# Patient Record
Sex: Female | Born: 1960 | Race: White | Hispanic: No | Smoking: Current some day smoker
Health system: Southern US, Community
[De-identification: ages and names within clinical notes are randomized; demographics above are authoritative.]

## PROBLEM LIST (undated history)

## (undated) DIAGNOSIS — J449 Chronic obstructive pulmonary disease, unspecified: Secondary | ICD-10-CM

---

## 1997-12-03 ENCOUNTER — Emergency Department (HOSPITAL_COMMUNITY): Admission: EM | Admit: 1997-12-03 | Discharge: 1997-12-03 | Payer: Self-pay | Admitting: Emergency Medicine

## 1997-12-04 ENCOUNTER — Ambulatory Visit (HOSPITAL_COMMUNITY): Admission: RE | Admit: 1997-12-04 | Discharge: 1997-12-04 | Payer: Self-pay | Admitting: Emergency Medicine

## 2007-07-22 ENCOUNTER — Ambulatory Visit: Payer: Self-pay | Admitting: Pulmonary Disease

## 2007-07-22 ENCOUNTER — Inpatient Hospital Stay (HOSPITAL_COMMUNITY): Admission: AD | Admit: 2007-07-22 | Discharge: 2007-07-29 | Payer: Self-pay | Admitting: Pulmonary Disease

## 2007-07-25 ENCOUNTER — Encounter (INDEPENDENT_AMBULATORY_CARE_PROVIDER_SITE_OTHER): Payer: Self-pay | Admitting: Pulmonary Disease

## 2008-02-12 ENCOUNTER — Encounter: Payer: Self-pay | Admitting: Gastroenterology

## 2008-02-13 ENCOUNTER — Encounter: Payer: Self-pay | Admitting: Gastroenterology

## 2008-03-21 ENCOUNTER — Encounter: Payer: Self-pay | Admitting: Gastroenterology

## 2008-04-09 ENCOUNTER — Ambulatory Visit: Payer: Self-pay | Admitting: *Deleted

## 2008-04-10 ENCOUNTER — Inpatient Hospital Stay (HOSPITAL_COMMUNITY): Admission: AD | Admit: 2008-04-10 | Discharge: 2008-04-11 | Payer: Self-pay | Admitting: *Deleted

## 2009-02-27 ENCOUNTER — Ambulatory Visit: Payer: Self-pay | Admitting: Emergency Medicine

## 2009-02-27 DIAGNOSIS — J8409 Other alveolar and parieto-alveolar conditions: Secondary | ICD-10-CM | POA: Insufficient documentation

## 2009-02-27 DIAGNOSIS — J449 Chronic obstructive pulmonary disease, unspecified: Secondary | ICD-10-CM

## 2009-02-27 DIAGNOSIS — F17210 Nicotine dependence, cigarettes, uncomplicated: Secondary | ICD-10-CM

## 2009-02-27 DIAGNOSIS — R0600 Dyspnea, unspecified: Secondary | ICD-10-CM | POA: Insufficient documentation

## 2009-02-27 DIAGNOSIS — R0602 Shortness of breath: Secondary | ICD-10-CM

## 2009-03-14 ENCOUNTER — Ambulatory Visit: Payer: Self-pay | Admitting: Emergency Medicine

## 2009-03-15 LAB — CONVERTED CEMR LAB
AST: 30 units/L (ref 0–37)
Alkaline Phosphatase: 78 units/L (ref 39–117)
Bilirubin, Direct: 0.2 mg/dL (ref 0.0–0.3)
Total Bilirubin: 0.9 mg/dL (ref 0.3–1.2)

## 2009-04-19 ENCOUNTER — Telehealth: Payer: Self-pay | Admitting: Emergency Medicine

## 2009-05-31 ENCOUNTER — Ambulatory Visit: Payer: Self-pay | Admitting: Emergency Medicine

## 2009-06-25 ENCOUNTER — Encounter: Payer: Self-pay | Admitting: Gastroenterology

## 2009-06-27 ENCOUNTER — Telehealth (INDEPENDENT_AMBULATORY_CARE_PROVIDER_SITE_OTHER): Payer: Self-pay | Admitting: *Deleted

## 2009-07-19 ENCOUNTER — Ambulatory Visit: Payer: Self-pay | Admitting: Emergency Medicine

## 2009-07-29 ENCOUNTER — Telehealth: Payer: Self-pay | Admitting: Internal Medicine

## 2009-09-06 ENCOUNTER — Encounter (INDEPENDENT_AMBULATORY_CARE_PROVIDER_SITE_OTHER): Payer: Self-pay | Admitting: *Deleted

## 2009-09-06 ENCOUNTER — Ambulatory Visit: Payer: Self-pay | Admitting: Emergency Medicine

## 2009-09-06 ENCOUNTER — Encounter: Payer: Self-pay | Admitting: Emergency Medicine

## 2009-09-06 DIAGNOSIS — R141 Gas pain: Secondary | ICD-10-CM

## 2009-09-06 DIAGNOSIS — R142 Eructation: Secondary | ICD-10-CM

## 2009-09-06 DIAGNOSIS — R143 Flatulence: Secondary | ICD-10-CM

## 2009-09-11 ENCOUNTER — Telehealth (INDEPENDENT_AMBULATORY_CARE_PROVIDER_SITE_OTHER): Payer: Self-pay | Admitting: *Deleted

## 2009-09-26 ENCOUNTER — Ambulatory Visit: Payer: Self-pay | Admitting: Gastroenterology

## 2009-09-26 DIAGNOSIS — K902 Blind loop syndrome, not elsewhere classified: Secondary | ICD-10-CM

## 2009-09-26 DIAGNOSIS — F411 Generalized anxiety disorder: Secondary | ICD-10-CM

## 2009-09-26 DIAGNOSIS — K589 Irritable bowel syndrome without diarrhea: Secondary | ICD-10-CM | POA: Insufficient documentation

## 2009-09-26 DIAGNOSIS — K5900 Constipation, unspecified: Secondary | ICD-10-CM | POA: Insufficient documentation

## 2009-09-27 LAB — CONVERTED CEMR LAB
Folate: 4.5 ng/mL
IgA: 276 mg/dL (ref 68–378)
Iron: 38 ug/dL — ABNORMAL LOW (ref 42–145)
Magnesium: 2.1 mg/dL (ref 1.5–2.5)
T4, Total: 9.2 ug/dL (ref 5.0–12.5)
TSH: 0.73 microintl units/mL (ref 0.35–5.50)
Vitamin B-12: 165 pg/mL — ABNORMAL LOW (ref 211–911)

## 2009-10-02 LAB — CONVERTED CEMR LAB: Tissue Transglutaminase Ab, IgA: 0.5 units (ref <7)

## 2009-10-07 ENCOUNTER — Ambulatory Visit: Payer: Self-pay | Admitting: Emergency Medicine

## 2009-10-07 DIAGNOSIS — D239 Other benign neoplasm of skin, unspecified: Secondary | ICD-10-CM | POA: Insufficient documentation

## 2009-10-18 ENCOUNTER — Telehealth: Payer: Self-pay | Admitting: Emergency Medicine

## 2009-10-29 ENCOUNTER — Telehealth (INDEPENDENT_AMBULATORY_CARE_PROVIDER_SITE_OTHER): Payer: Self-pay | Admitting: *Deleted

## 2009-11-08 ENCOUNTER — Ambulatory Visit: Payer: Self-pay | Admitting: Emergency Medicine

## 2009-11-11 ENCOUNTER — Ambulatory Visit: Payer: Self-pay | Admitting: Gastroenterology

## 2009-11-11 DIAGNOSIS — K219 Gastro-esophageal reflux disease without esophagitis: Secondary | ICD-10-CM

## 2009-11-20 ENCOUNTER — Telehealth: Payer: Self-pay | Admitting: Emergency Medicine

## 2009-11-20 ENCOUNTER — Telehealth: Payer: Self-pay | Admitting: Gastroenterology

## 2009-11-29 ENCOUNTER — Telehealth: Payer: Self-pay | Admitting: Pulmonary Disease

## 2009-12-06 ENCOUNTER — Ambulatory Visit: Payer: Self-pay | Admitting: Emergency Medicine

## 2009-12-09 ENCOUNTER — Telehealth (INDEPENDENT_AMBULATORY_CARE_PROVIDER_SITE_OTHER): Payer: Self-pay | Admitting: *Deleted

## 2009-12-11 ENCOUNTER — Ambulatory Visit: Payer: Self-pay | Admitting: Cardiology

## 2009-12-12 ENCOUNTER — Telehealth (INDEPENDENT_AMBULATORY_CARE_PROVIDER_SITE_OTHER): Payer: Self-pay | Admitting: *Deleted

## 2009-12-14 ENCOUNTER — Telehealth (INDEPENDENT_AMBULATORY_CARE_PROVIDER_SITE_OTHER): Payer: Self-pay | Admitting: *Deleted

## 2009-12-14 ENCOUNTER — Telehealth: Payer: Self-pay | Admitting: Emergency Medicine

## 2009-12-19 ENCOUNTER — Telehealth: Payer: Self-pay | Admitting: Gastroenterology

## 2009-12-19 ENCOUNTER — Telehealth (INDEPENDENT_AMBULATORY_CARE_PROVIDER_SITE_OTHER): Payer: Self-pay | Admitting: *Deleted

## 2010-09-23 NOTE — Progress Notes (Signed)
Summary: CXR results  Phone Note Call from Patient Call back at Home Phone [phone]   Caller: Patient Call For: byrum Reason for Call: Talk to Nurse, Lab or Test Results Summary of Call: pt would like results of cxr Initial call taken by: Eugene Gavia,  September 11, 2009 11:14 AM  Follow-up for Phone Call        cxr results from 09/06/09 are signed in EMR but are not interpreted.  Will forward to RB-please advise.  Thanks! Gweneth Dimitri RN  September 11, 2009 11:17 AM  Please notify pt that her CXR is stable to slightly improved compared with her prior film. She should stay on her current meds. Follow up as planned. Leslye Peer MD  September 11, 2009 4:36 PM   Follow-up by: Leslye Peer MD,  September 11, 2009 4:36 PM  Additional Follow-up for Phone Call Additional follow up Details #1::        called, spoke with pt.  Pt informed of cxr results and recs as stated above per RB.  She verbalized understanding of instructions.   Additional Follow-up by: Gweneth Dimitri RN,  September 11, 2009 4:42 PM

## 2010-09-23 NOTE — Assessment & Plan Note (Signed)
Summary: COP, COPD, tobacco   Visit Type:  Follow-up Copy to:  Levy Pupa, MD Primary Provider/Referring Provider:  Dr. Donnel Saxon  CC:  Pt here for 1 month follow up. Pt states was diagnosed with PNA 11/30/2009 when seen @ [facility] in [place] and currently being treated with Avelox. Pt states is down to Prednisone 4mg  daily. Pt states breathing has gotten worse "at times" since last OV.  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 Hospitalized for resp failure at [facility] 11/08 requiring BiPAP. Has been treated with chronic prednisone. Frequent decompensations that seem to come after URI's or allergies, characterized by wheezing, more dyspnea, mid chest pain, cough productive of yellow phlegm.   ROV 11/08/09 -- Returns for follow up. Tells me that she was admitted to [facility] end of February, apparently dx with CAP, ? bacteremia. Was exposed to people w/ URI prior to that illness. Was treated with pred burst, then back down to 5mg  once daily (we had decreased to 5mg  once daily at our last visit). Dr Stefanie Libel office has given her a script to decrease the Pred by 1mg  qweek until down to zero. Smoking about 4 cigarettes a week!  ROV 12/06/09 -- returns for f/u of her COP, COPD. Was seen at urgent care a week ago with low grade temp, URI symptoms. Evolved cough, wheeze, green phlegm. Had significant wheeze, SOB. Was treated with avelox + solumedrol. Then stayed on her usual prednisone to 4mg  once daily. Using Spiriva + Symbicort + xopenex as needed. Smoking less than a pack a day. Still using the nicotine cartridge.  CXR from Urgent Care reviewed today - shows chronic interstitial changes, RUL calcified granuloma at the fisure that looks stable from priors.     Current Medications (verified): 1)  Spiriva Handihaler 18 Mcg Caps (Tiotropium Bromide Monohydrate) .... I Puff Once Daily 2)  Symbicort 160-4.5 Mcg/act Aero  (Budesonide-Formoterol Fumarate) .... 2 Puffs Two Times A Day 3)  Furosemide 20 Mg Tabs (Furosemide) .... 60mg  Daily As Needed 4)  Pristiq 50 Mg Xr24h-Tab (Desvenlafaxine Succinate) .... Once Daily 5)  Alprazolam 2 Mg Tabs (Alprazolam) .... Three Times A Day As Needed 6)  Nasonex 50 Mcg/act Susp (Mometasone Furoate) .... 2 Puffs in Each Nostril Once Daily 7)  Xopenex 1.25 Mg/89ml Nebu (Levalbuterol Hcl) .... As Needed 8)  Tramadol Hcl 50 Mg Tabs (Tramadol Hcl) .... One Tablet By Mouth Every 6 Hours As Needed 9)  Prednisone 5 Mg Tabs (Prednisone) .Marland Kitchen.. 1 By Mouth Daily X1 Week; Tapering Down 1mg  Every Week Until Finished 10)  Nexium 40 Mg Cpdr (Esomeprazole Magnesium) .Marland Kitchen.. 1 By Mouth Q Am 11)  Creon 24000 Unit Cpep (Pancrelipase (Lip-Prot-Amyl)) .Marland Kitchen.. 1 By Mouth Three Times A Day With Meals 12)  Avelox 400 Mg Tabs (Moxifloxacin Hcl) .... Take 1 Tablet By Mouth Once A Day 13)  Vitamin C 500 Mg Tabs (Ascorbic Acid) .... Take 1 Tablet By Mouth Once A Day 14)  Vitamin D3 2000 Unit Caps (Cholecalciferol) .... Take 1 Tablet By Mouth Once A Day 15)  Cheratussin Ac 100-10 Mg/82ml Syrp (Guaifenesin-Codeine) .... As Needed 16)  Promethazine Hcl 50 Mg Tabs (Promethazine Hcl) .... As Needed  Allergies (verified): 1)  Sudafed 2)  * Dimetapp 3)  Sulfamethoxazole 4)  * Itraconazole 5)  * Hydrocodone/apap 6)  Codeine 7)  * Antihistamines  Vital Signs:  Patient profile:   50 year old female Height:  64 inches Weight:      129.13 pounds O2 Sat:      100 % on 2 L/min Temp:     97.7 degrees F oral Pulse rate:   87 / minute BP sitting:   110 / 76  (right arm) Cuff size:   regular  Vitals Entered By: Zackery Barefoot CMA (December 06, 2009 2:25 PM)  O2 Flow:  2 L/min  Serial Vital Signs/Assessments:  Comments: Ambulatory Pulse Oximetry  Resting; HR__85___    02 Sat__91%RA___  Lap1 (185 feet)   HR__102___   02 Sat__88%RA___ Lap2 (185 feet)   HR_____   02 Sat_____    Lap3 (185 feet)   HR_____    02 Sat_____  ___Test Completed without Difficulty __x_Test Stopped due to: pt c/o being too dizzy to continue.  Zackery Barefoot CMA  December 06, 2009 3:41 PM    By: Zackery Barefoot CMA   CC: Pt here for 1 month follow up. Pt states was diagnosed with PNA 11/30/2009 when seen @ [facility] in [place] and currently being treated with Avelox. Pt states is down to Prednisone 4mg  daily. Pt states breathing has gotten worse "at times" since last OV Comments Medications reviewed with patient Verified contact number and pharmacy with patient Zackery Barefoot CMA  December 06, 2009 2:26 PM    Physical Exam  General:  Illl-appearing woman Head:  normocephalic and atraumatic, cushingoid features.  Eyes:  conjunctiva and sclera clear, no icterus Mouth:  no deformity or lesions Lungs:  coarse bilaterally with inspiratory rhonchi and squeeks, no exp wheeze.  Heart:  tachycardic, regular Abdomen:  not examined Msk:  no deformity or scoliosis noted with normal posture Extremities:  no edema Neurologic:  non-focal Skin:  intact without lesions or rashes Psych:  alert and cooperative; normal mood and affect; normal attention span and concentration   X-ray  Procedure date:  11/30/2009  Findings:      CXR from Urgent Care reviewed today - shows chronic interstitial changes, RUL calcified granuloma at the fisure that looks stable from priors.   Pulmonary Function Test Date: 12/06/2009 3:26 PM Gender: Female  Pre-Spirometry FVC    Value: 1.19 L/min   % Pred: 33.70 % FEV1    Value: 1.00 L     Pred: 2.81 L     % Pred: 35.50 % FEV1/FVC  Value: 83.77 %     % Pred: 104.20 %  Impression & Recommendations:  Problem # 1:  COPD (ICD-496) With recent URI and exacerbation. 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 One tablet by mouth every 6 hours as needed 2)  Avelox 400 Mg Tabs (Moxifloxacin hcl) .... Take 1 tablet by mouth once a day 3)  Vitamin C 500 Mg Tabs (Ascorbic acid) .... Take 1 tablet by mouth once a day 4)  Vitamin D3 2000 Unit Caps (Cholecalciferol) .... Take 1 tablet by mouth once a day 5)  Cheratussin Ac 100-10 Mg/71ml Syrp (Guaifenesin-codeine) .... As needed 6)  Promethazine Hcl  50 Mg Tabs (Promethazine hcl) .... As needed  Other Orders: Est. Patient Level IV (16109) Spirometry (Pre & Post) (94060) Pulse Oximetry, Ambulatory (60454) Radiology Referral (Radiology)  Patient Instructions: 1)  Continue your Spiriva and Symbicort 2)  Use Xopenex as needed  3)  Walking oximetry today showed 4)  Spirometry today shows your lung function is 35% 5)  We will schedule a high resolution CT scan of the chest to compare with your prior study in 06/2007.  6)  You must quit smoking for [place] to make any further progress with your lung disease.  7)  Continue your prednisone 4mg  once daily through April, then decrease by 1mg  each month: start taking 3mg  once daily in May.  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

## 2010-09-23 NOTE — Assessment & Plan Note (Signed)
Summary: COPD, ? COP, tobacco   Visit Type:  Follow-up Copy to:  Dr. Ardelle Park Primary Provider/Referring Provider:  Dr. Donnel Saxon  CC:  2 month followup COPD.  Pt c/o wheezing and CP for about 3 wks.  She states that pain is in the center of chest and worsens u[on inspiration.  She still c/o SOB with exertion "maybe some worse".  She had also had some prod cough with yellow sputum and relates this to "sinus drainage".  .  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 Hospitalized for resp failure at [facility] 11/08 requiring BiPAP. Has been on O2 since 2004. Her subsequent course has been "up and down". She does well but then has decompensations that seem to come after URI's or allergies, characterized by wheezing, more dyspnea, mid chest pain, cough productive of yellow phlegm. No hemoptysis. Last episode like this was in May 2010, admitted to [facility]. Last XRays done at [place]. Smoking about a pack a week. Episodes are getting progressively worse over several yrs. She is currently limited in her activity due to breathing and back pain.  ROV 03/14/09 -- returns for planned follow up today. Breathing is slightly improved. No exacerbations since last visit. Remains on 15mg  once daily Prednisone.   ROV 05/31/09 -- returns for follow up, torsemide added to lasix in order to better treat possible ascites. Breathing has been fairly stable. Currently on Pred 15mg  once daily. No hospitalizations since last visit. Has been wearing her O2 with most exertion. On Symbicort and Spiriva.   07/19/09 -- Hx of BOOP, ? 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 Last time I decreased her Prednisone to 10mg  qd. Returns for follow up. Had CT scan of the abdomen 3 weeks ago, was told that it was normal with exception of ovarian cysts. Has seen Dr Ardelle Park, currently being treated for COPD exacerbation +/- PNA with avelox x 2 weeks and increased her  prednisone to 15mg  qd. She presented with URI symptoms to him. Her cough and sputum production have improved with the abx. Smoking is significantly decreased, hasn't quit yet.   ROV 09/06/09 -- Caught a cold a few weeks ago, went to [place] ED and got levaquin, no change in Pred, currently on 10mg . Still with some cough, rarely productive. Severe exertional dyspnea. She couldn't tell any improvement after the abx. Smoking less because she is too SOB, pack lasts a week.     Current Medications (verified): 1)  Spiriva Handihaler 18 Mcg Caps (Tiotropium Bromide Monohydrate) .... I Puff Once Daily 2)  Symbicort 160-4.5 Mcg/act Aero (Budesonide-Formoterol Fumarate) .... 2 Puffs Two Times A Day 3)  Prednisone 5 Mg Tabs (Prednisone) .... 3 Tabs Once Daily 4)  Furosemide 20 Mg Tabs (Furosemide) .... 60mg  Daily 5)  Pristiq 50 Mg Xr24h-Tab (Desvenlafaxine Succinate) .... Once Daily 6)  Dicyclomine Hcl 20 Mg Tabs (Dicyclomine Hcl) .... Four Times Daily 30 Minutes Before Each Meal 7)  Alprazolam 2 Mg Tabs (Alprazolam) .... Three Times A Day 8)  Nasonex 50 Mcg/act Susp (Mometasone Furoate) .... 2 Puffs in Each Nostril Once Daily 9)  Xopenex 1.25 Mg/74ml Nebu (Levalbuterol Hcl) .... As Needed 10)  Ibuprofen 600 Mg Tabs (Ibuprofen) .... As Needed 11)  Lyrica 100 Mg Caps (Pregabalin) .... Three Times A Day As Needed 12)  Align  Caps (Probiotic Product) .Marland Kitchen.. 1 By Mouth Daily  Allergies (verified): 1)  Sudafed 2)  * Dimetapp  3)  Sulfamethoxazole 4)  * Itraconazole 5)  * Hydrocodone/apap 6)  Codeine 7)  * Antihistamines  Vital Signs:  Patient profile:   50 year old female Weight:      139 pounds O2 Sat:      99 % on 2 L/min Temp:     97.2 degrees F oral Pulse rate:   80 / minute BP sitting:   122 / 80  (left arm)  Vitals Entered By: Vernie Murders (September 06, 2009 11:50 AM)  O2 Flow:  2 L/min  Physical Exam  General:  Illl-appearing woman Head:  normocephalic and atraumatic, cushingoid  features Eyes:  conjunctiva and sclera clear, no icterus Mouth:  no deformity or lesions Lungs:  coarse bilaterally with inspiratory rhonchi and squeeks, no exp wheeze.  Heart:  tachycardic, regular Abdomen:  protuberant but much softer than initial visit, mild tenderness to deep palp Msk:  no deformity or scoliosis noted with normal posture Extremities:  no edema Neurologic:  non-focal Skin:  intact without lesions or rashes Psych:  tearful, answers questions appropriately.    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 Patient Level IV (99214) T-2 View CXR (71020TC)  Patient Instructions: 1)  Continue your Spiriva and Symbicort as scheduled 2)  Xopenex as needed.  3)  Continue your oxygen as you are using it.  4)  Walking oximetry today on 2L/min.  5)  CXR today.  6)  Continue your efforts to stop smoking.  7)  Follow up with Dr Delton Coombes in 1 month.   Appended Document: COPD, ? COP, tobacco Ambulatory Pulse Oximetry  Resting; HR_82____    02 Sat_98% 2L cont____  Lap1 (185 feet)   HR__101___   02 Sat__98% 2Lcont___ Lap2 (185 feet)   HR_____   02 Sat_____    Lap3 (185 feet)   HR_____   02 Sat_____  ___Test Completed without Difficulty __x_Test Stopped due to:         pt needed to rest.

## 2010-09-23 NOTE — Miscellaneous (Signed)
Summary: Orders Update  Clinical Lists Changes  Problems: Added new problem of ABDOMINAL DISTENSION (ICD-787.3) Orders: Added new Referral order of Gastroenterology Referral (GI) - Signed

## 2010-09-23 NOTE — Progress Notes (Signed)
Summary: speak to doctor  Phone Note From Other Clinic Call back at [phone]   Caller: Dr. [facility] Call For: Krystal Cantu Summary of Call: Pt admitted to [facility], Dr. Ernest Mallick wants to speak with Dr. Delton Coombes in reference to this.//[phone] Initial call taken by: Darletta Moll,  October 18, 2009 9:32 AM  Follow-up for Phone Call        I spoke with Dr. Ernest Mallick and he just wanted to make sure RB was aware pt was being admitted to [facility] with the diagnosis of pneumonia. He said he will make sure her records get sent to [facility] for review. Follow-up by: Michel Bickers CMA,  October 18, 2009 11:35 AM

## 2010-09-23 NOTE — Progress Notes (Signed)
Summary: ct results  Phone Note Call from Patient Call back at Home Phone [phone]   Caller: Patient Call For: byrum Reason for Call: Talk to Nurse, Lab or Test Results Summary of Call: pt wants results of CT scan.  No one has called or set her up for return appt. Initial call taken by: Eugene Gavia,  December 19, 2009 10:45 AM  Follow-up for Phone Call        Dr. Delton Coombes, please advise CT Chest results from 4/20 thanks! Follow-up by: Vernie Murders,  December 19, 2009 10:54 AM  Additional Follow-up for Phone Call Additional follow up Details #1::        Please inform pt that her CT scan is stable. It still shows emphysema, evidence for chronic airways inflammation, some stable nodules. thanks. Leslye Peer MD  Dec 23, 2009 2:58 PM     Additional Follow-up for Phone Call Additional follow up Details #2::    Spoke with pt and advised of the above results.  Pt verbalized understanding. Vernie Murders  Dec 23, 2009 3:04 PM

## 2010-09-23 NOTE — Procedures (Signed)
Summary: [facility]   Imported By: Sherian Rein 10/03/2009 11:02:42  _____________________________________________________________________  External Attachment:    Type:   Image     Comment:   External Document

## 2010-09-23 NOTE — Progress Notes (Signed)
Summary: Triage  Phone Note Call from Patient Call back at Home Phone [phone]   Caller: Patient Call For: Dr. Jarold Motto Reason for Call: Talk to Nurse Summary of Call: pt. 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 Initial call taken by: Karna Christmas,  December 19, 2009 11:07 AM  Follow-up for Phone Call        Pt states she cont's to have problems with bowels.  Alternates between constipation and diarrhea.  Abd bloating and fullness.  Con't to loose wt.  Down to 121 lbs.   Pt states Dr. 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  Pt asks if this could be the cause of her symptoms. .  Down to 121 lbs.   If so asking if there is a treatment she can take.  States she if miserable.  Follow-up by: Ashok Cordia RN,  December 19, 2009 3:58 PM  Additional Follow-up for Phone Call Additional follow up Details #1::        end stage lung disease...does not have bacterial overgrowtrh...refer to [facility] if she wants...i have no other ideas here,,,several negative gi workups... Additional Follow-up by: Mardella Layman MD FACG,  December 20, 2009 8:23 AM    Additional Follow-up for Phone Call Additional follow up Details #2::    Pt notified. Follow-up by: Ashok Cordia RN,  December 20, 2009 12:36 PM

## 2010-09-23 NOTE — Progress Notes (Signed)
Summary: pt requesting ct chest results  Phone Note Call from Patient Call back at Home Phone [phone]   Caller: Patient Call For: byrum Summary of Call: pt wants results of CT.  Initial call taken by: Tivis Ringer, CNA,  December 12, 2009 4:18 PM  Follow-up for Phone Call        Spoke with pt and advised results are unsigned and RB not back in the office again until 4/26.  Pt okay to wait until then for results.  Please advise thanks! Follow-up by: Vernie Murders,  December 12, 2009 4:31 PM  Additional Follow-up for Phone Call Additional follow up Details #1::        discussed with her Leslye Peer MD  December 18, 2009 3:58 PM  Additional Follow-up by: Leslye Peer MD,  December 18, 2009 3:58 PM

## 2010-09-23 NOTE — Assessment & Plan Note (Signed)
Summary: hospital follow up/lk   History of Present Illness Visit Type: Follow-up Visit Primary GI MD: Sheryn Bison MD FACP FAGA Primary Provider: Dr. Donnel Saxon Requesting Provider: Levy Pupa, MD Chief Complaint: Abdominal pain after meals, some distension History of Present Illness:   Extremely complex patient with bronchiectasis and recurrent pneumonia folic placed about pulmonary. She is on frequent steroids as recently had pneumonia and sepsis. She continued to complain of abdominal distention, gas and bloating unresponsive to most medications but she has had some improvement with probiotic therapy. Lab data showed a low serum trypsinogen level suggestive of chronic pancreatitis, and she also had a low serum B12 level and has had pleural placement. She continues to complain of gas, bloating, and alternating diarrhea and constipation. She is on chronic oxygen administration.    GI Review of Systems    Reports abdominal pain, acid reflux, bloating, loss of appetite, nausea, and  weight loss.     Location of  Abdominal pain: lower abdomen.    Denies belching, chest pain, dysphagia with liquids, dysphagia with solids, heartburn, vomiting, vomiting blood, and  weight gain.      Reports change in bowel habits, constipation, and  diarrhea.     Denies anal fissure, black tarry stools, diverticulosis, fecal incontinence, heme positive stool, hemorrhoids, irritable bowel syndrome, jaundice, light color stool, liver problems, rectal bleeding, and  rectal pain.    Current Medications (verified): 1)  Spiriva Handihaler 18 Mcg Caps (Tiotropium Bromide Monohydrate) .... I Puff Once Daily 2)  Symbicort 160-4.5 Mcg/act Aero (Budesonide-Formoterol Fumarate) .... 2 Puffs Two Times A Day 3)  Furosemide 20 Mg Tabs (Furosemide) .... 60mg  Daily As Needed 4)  Pristiq 50 Mg Xr24h-Tab (Desvenlafaxine Succinate) .... Once Daily 5)  Alprazolam 2 Mg Tabs (Alprazolam) .... Three Times A Day As Needed 6)   Nasonex 50 Mcg/act Susp (Mometasone Furoate) .... 2 Puffs in Each Nostril Once Daily 7)  Xopenex 1.25 Mg/13ml Nebu (Levalbuterol Hcl) .... As Needed 8)  Tramadol Hcl 50 Mg Tabs (Tramadol Hcl) .... One Tablet By Mouth Every 6 Hours 9)  Phillips Colon Health  Caps (Probiotic Product) .... As Needed 10)  Miralax   Powd (Polyethylene Glycol 3350) .... Use Each Night 11)  Prednisone 5 Mg Tabs (Prednisone) .Marland Kitchen.. 1 By Mouth Daily X1 Week; Tapering Down 1mg  Every Week Until Finished  Allergies (verified): 1)  Sudafed 2)  * Dimetapp 3)  Sulfamethoxazole 4)  * Itraconazole 5)  * Hydrocodone/apap 6)  Codeine 7)  * Antihistamines  Past History:  Past medical, surgical, family and social histories (including risk factors) reviewed for relevance to current acute and chronic problems.  Past Medical History: Reviewed history from 09/26/2009 and no changes required. 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 Lung biopsy hysterectomy BMT Tonsillectomy Partial Hysterectomy Feet Surgery Adenoids removed  Family History: Reviewed history from 09/26/2009 and no changes required. Cancer-mother(cerivcal),  Lung CA-Father HTN Heart problem-father arthritis-father Breast CA- maternal aunt emphsema-father allergies-mother Family History of Colon Polyps:Grandmother  Social History: Reviewed history from 09/26/2009 and no changes required. Patient is a current smoker.  2-3ppd x46yr.  currently smoking about 1 pack a week per pt. Divorced Disabled Alcohol Use - no Illicit Drug Use - no  Review of Systems  The patient complains of  arthritis/joint pain, back pain, cough, depression-new, fatigue, headaches-new, heart rhythm changes, muscle pains/cramps, shortness of breath, sleeping problems, swelling of feet/legs, and thirst - excessive.  The patient denies allergy/sinus, anemia, anxiety-new, blood in urine, breast changes/lumps, change in vision, confusion, coughing up blood, fainting, fever, hearing problems, heart murmur, itching, menstrual pain, night sweats, nosebleeds, pregnancy symptoms, skin rash, sore throat, swollen lymph glands, thirst - excessive , urination - excessive , urination changes/pain, urine leakage, vision changes, and voice change.    Vital Signs:  Patient profile:   50 year old female Height:      64 inches Weight:      129.50 pounds BMI:     22.31 Pulse rate:   88 / minute Pulse rhythm:   regular BP sitting:   102 / 58  (left arm) Cuff size:   regular  Vitals Entered By: June McMurray CMA Duncan Dull) (November 11, 2009 3:32 PM)  Physical Exam  General:  Well developed, well nourished, no acute distress. Head:  Normocephalic and atraumatic. Eyes:  PERRLA, no icterus.exam deferred to patient's ophthalmologist.   Abdomen:  Distended abdomen but without organomegaly, masses, localized tenderness. Psych:  Alert and cooperative. Normal mood and affect.   Impression & Recommendations:  Problem # 1:  IBS (ICD-564.1) Assessment Improved Continue Probiotic therapy empirically.There is a suggestion of possible chronic pancreatitis and I will place her on Creon tablets one p.o. t.i.d. with meals. She is to continue monthly B12 injection therapy.  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 Delton Coombes.  Problem # 5:  GERD (ICD-530.81) Assessment: Unchanged Nexium 40 mg a  day prescribed along with standard anti-reflux maneuvers.  Patient Instructions: 1)  Begin Nexium 40 mg once a day. 2)   Begin Creon one three times a day with meals. 3)  Call us in a couple of weeks and report your symptoms. 4)  The medication list was reviewed and reconciled.  All changed / newly prescribed medications were explained.  A complete medication list was provided to the patient / caregiver. 5)  Please continue current medications.  6)  Copy sent to : Dr. Levy Pupa and pulmonary and Dr. Donnel Saxon 7)  Please call our GI Office back in 2 weeks with a follow-up of symptoms. Prescriptions: CREON 24000 UNIT CPEP (PANCRELIPASE (LIP-PROT-AMYL)) 1 by mouth three times a day with meals  #90 x 6   Entered by:   Ashok Cordia RN   Authorized by:   Mardella Layman MD [facility]   Signed by:   Ashok Cordia RN on 11/11/2009   Method used:   Electronically to        [facility] (retail)       [address]       [place], [place]  [id number]       Ph: [phone]       Fax: [phone]   RxID:   [id number] NEXIUM 40 MG CPDR (ESOMEPRAZOLE MAGNESIUM) 1 by mouth q am  #30 x 6   Entered by:   Ashok Cordia RN   Authorized by:   Mardella Layman MD [facility]   Signed by:   Ashok Cordia RN on 11/11/2009   Method used:   Electronically to  [facility] (retail)       [address]       [place], [place]  [id number]       Ph: [phone]       Fax: [phone]   RxID:   [id number]

## 2010-09-23 NOTE — Progress Notes (Signed)
  Phone Note Call from Patient   Caller: Patient Summary of Call: She has been feeling sick for the past few days.  She has been running a low grade temp of 100F.  She has been getting congested and more short of breath.  She has tried using her inhalers at home, but this has not helped much.  Her daughter was recently sick with a cold, and she thinks she may have picked something up.  She was seen by her primary physician in [place], and told that she had coarse breath sounds.  I advised Ms. Wilshire to go to the emergency room to have further evaluation with possible need for chest xray as there is concern that she could be developing pneumonia.  Advised her to go to [facility] since this is closer to her.  Advised that if needed arrangements could be made to have her transported to [facility].  Initial call taken by: Coralyn Helling MD,  November 29, 2009 8:32 PM

## 2010-09-23 NOTE — Progress Notes (Signed)
  Phone Note Call from Patient   Summary of Call: Received page from answering service to contact patient at [phone].  Returned call to this number, but was connected to voicemail.  Left message for patient to contact answering service again if pulmonary assistance was still needed at this time. Initial call taken by: Coralyn Helling MD,  November 29, 2009 8:19 PM

## 2010-09-23 NOTE — Progress Notes (Signed)
Summary: talk to nurse  Phone Note Call from Patient   Caller: Patient Call For: byrum Summary of Call: pt want to know if she can use the electronic cigarrette Initial call taken by: Rickard Patience,  October 29, 2009 2:38 PM  Follow-up for Phone Call        [facility]. Carron Curie CMA  October 29, 2009 2:43 PM  Pt wants to know if RB thinks it is ok for her to use an electronic cigarette? States this is 99% water, and what she would be inhaling is water vapor that has some nicotine, but according to the patient it does not have any of the other incredients that a cig does. She does not need an rx for this, she just wants to know if RB thinks it is ok to try. Please advise. Carron Curie CMA  October 29, 2009 3:00 PM  I think this would be fine IF she does it in conjunction with an overall effort to quit: set a quit date, get rid of cigarettes, tell her friends/family that she is quitting, etc. Leslye Peer MD  November 04, 2009 2:45 PM    Follow-up by: Leslye Peer MD,  November 04, 2009 2:45 PM  Additional Follow-up for Phone Call Additional follow up Details #1::        Spoke with pt and made aware of the above recs per RB.  Pt states that she has plenty of support from family and friends and already has gotten rid of her ashtrays, Management consultant, cigs! She already has quit date also! Advised to call us if needed and good luck! Additional Follow-up by: Vernie Murders,  November 04, 2009 3:01 PM

## 2010-09-23 NOTE — Letter (Signed)
Summary: New Patient letter  [facility]  [address] [place], [place] [address]   Phone: [phone]  Fax: [phone]       09/06/2009 MRN: [id number]  Krystal Cantu [address] [place], [place]  [address]  Dear Krystal Cantu,  Welcome to the Gastroenterology Division at [facility].    You are scheduled to see Dr.  Jarold Motto on 09/26/2009 at 8:30AM on the 3rd floor at [facility], [address]  We ask that you try to arrive at our office 15 minutes prior to your appointment time to allow for check-in.  We would like you to complete the enclosed self-administered evaluation form prior to your visit and bring it with you on the day of your appointment.  We will review it with you.  Also, please bring a complete list of all your medications or, if you prefer, bring the medication bottles and we will list them.  Please bring your insurance card so that we may make a copy of it.  If your insurance requires a referral to see a specialist, please bring your referral form from your primary care physician.  Co-payments are due at the time of your visit and may be paid by cash, check or credit card.     Your office visit will consist of a consult with your physician (includes a physical exam), any laboratory testing he/she may order, scheduling of any necessary diagnostic testing (e.g. x-ray, ultrasound, CT-scan), and scheduling of a procedure (e.g. Endoscopy, Colonoscopy) if required.  Please allow enough time on your schedule to allow for any/all of these possibilities.    If you cannot keep your appointment, please call [phone] to cancel or reschedule prior to your appointment date.  This allows [place] the opportunity to schedule an appointment for another patient in need of care.  If you do not cancel or reschedule by 5 p.m. the business day prior to your appointment date, you will be charged a $50.00 late cancellation/no-show fee.    Thank you for  choosing [facility] for your medical needs.  We appreciate the opportunity to care for you.  Please visit [place] at our website  to learn more about our practice.                     Sincerely,                                                             The Gastroenterology Division

## 2010-09-23 NOTE — Procedures (Signed)
Summary: EGD/[facility]  EGD/[facility]   Imported By: Sherian Rein 10/03/2009 11:03:43  _____________________________________________________________________  External Attachment:    Type:   Image     Comment:   External Document

## 2010-09-23 NOTE — Progress Notes (Signed)
Summary: Triage  Phone Note Call from Patient Call back at Home Phone [phone]   Caller: Patient Call For: Dr. Jarold Motto Reason for Call: Talk to Nurse Summary of Call: pt says she is losing weight and is very weak. Has a good appetite but continues to lose weight.  Wants to know if the Creon medication could be causing this? Initial call taken by: Karna Christmas,  November 20, 2009 2:49 PM  Follow-up for Phone Call        Talked with pt.  She is concerned about continued weight loss and lack of energy.  Pt has lost from 149-129 in 7 months.   Pt states her abd swelling has gone down.  Pt has severe COPD and numerous other problems.  Pt asking if there is anything else she needs to be doing or any other test that she needs to have done. Follow-up by: Ashok Cordia RN,  November 20, 2009 3:24 PM    Additional Follow-up for Phone Call Additional follow up Details #2::    she has end stage pulmonary disease...needs i care or pulmonary ongoing care  Follow-up by: Mardella Layman MD FACG,  November 20, 2009 3:41 PM  Additional Follow-up for Phone Call Additional follow up Details #3:: Details for Additional Follow-up Action Taken: Pt notified. Additional Follow-up by: Ashok Cordia RN,  November 20, 2009 4:38 PM

## 2010-09-23 NOTE — Progress Notes (Signed)
Summary: speak to doctor  Phone Note From Other Clinic Call back at [phone]   Caller: Dr. [facility] Call For: Maveryck Bahri Request: Talk with Provider Summary of Call: Pt admitted admitted to hospital, Dr. Ernest Mallick wants to speak with you in reference to this.//[phone] Initial call taken by: Darletta Moll,  October 18, 2009 9:28 AM

## 2010-09-23 NOTE — Progress Notes (Signed)
Summary: weak  Phone Note Call from Patient Call back at Home Phone [phone]   Caller: Patient Call For: Bijon Mineer Reason for Call: Talk to Nurse Summary of Call: pt says she is losing a lot of weight.  Doesn't understand why, still feel really tired.  Could this be her potassium or B-12 being low? Initial call taken by: Eugene Gavia,  November 20, 2009 1:22 PM  Follow-up for Phone Call        Pt states she has continued to lose weight and is not sure why. She states her appetite is good. She does mention that Dr. Jarold Motto has diagnosed her with something with her bowels (she wasn't sure of the name) and wanted to know if this could cause her to loose weight. I advised for her to call Dr. Maris Berger office about that and provided her with the number. She asked that I would send a message to Dr. Delton Coombes, to get his opinion about the weight loss. I advised he was not inthe office but that I will send him a meesage. In the meantime I advised her to call GI. Please advise. Carron Curie CMA  November 20, 2009 2:43 PM  Attempted to call, left message. Will call her back. Leslye Peer MD  November 27, 2009 10:15 AM   Follow-up by: Leslye Peer MD,  November 27, 2009 10:15 AM  Additional Follow-up for Phone Call Additional follow up Details #1::        Spoke with pt - she has discussed with Dr Jarold Motto. Started on Creon. Received Vit B12 injection from Dr Ardelle Park 11/27/09. She is scheduled to have spirometry with her PCP, she will make sure that this gets sent to our office. Leslye Peer MD  November 28, 2009 5:24 PM  Additional Follow-up by: Leslye Peer MD,  November 28, 2009 5:28 PM

## 2010-09-23 NOTE — Assessment & Plan Note (Signed)
Summary: ABD DISTENSION/ABD PAIN AFTER EATING...AS.   History of Present Illness Visit Type: Initial Consult Primary GI MD: Sheryn Bison MD FACP FAGA Primary Provider: Dr. Donnel Saxon Requesting Provider: Levy Pupa, MD Chief Complaint: Abdominal swelling and distention after meals. Pt states when she has abd swelling it does hurt.  History of Present Illness:   Very complex and difficult patient referred by Dr. Levy Pupa in pulmonary for evaluation of abdominal gas, bloating, and distention.  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 She has a large volume of records that I tried to review as best as possible which was very time consuming and difficult. She also required a great deal of time to go over her records, her symptoms, and I've explained things to her on several occasions but she seems unable to remember anything we have spoken about.  She has had An Excellent, Excellent evaluation by Dr. Chales Abrahams in [place] including, endoscopy with small bowel biopsies, colonoscopy with appropriate biopsies, gallbladder imaging, abdominal CT scans and ultrasounds, all of which had been normal. She has not responded to treatment with anti-spasmodic and she currently is on dicyclomine 20 mg t.i.d. She also in the past has not responded to treatment with PPI therapy. Attempts to treat her for bacterial overgrowth syndrome syndrome with antibiotics has not helped her complaints at all.  She has abdominal distention, gas and bloating but very little abdominal pain and denies chronic diarrhea. She is convinced she has ascites but this has not been confirmed. In fact, CT scan within the last month has been unremarkable. Labs are reviewed and she is not anemic and has normal liver function tests. There is no history of hepatitis or pancreatitis. Exam did show no evidence of portal hypertension. She has known lactose  intolerance but denies use of sorbitol or fructose. She has no symptoms suggestive of chronic malabsorption or protein calorie malnutrition. She currently is on prednisone, Lasix, a variety of inhalers,alprazolam 2 mg 3 times a day, and tramadol every 4-6 hours. She has a history of multiple drug allergies.   GI Review of Systems    Reports abdominal pain and  bloating.     Location of  Abdominal pain: generalized.    Denies acid reflux, belching, chest pain, dysphagia with liquids, dysphagia with solids, heartburn, loss of appetite, nausea, vomiting, vomiting blood, weight loss, and  weight gain.      Reports constipation.     Denies anal fissure, black tarry stools, change in bowel habit, diarrhea, diverticulosis, fecal incontinence, heme positive stool, hemorrhoids, irritable bowel syndrome, jaundice, light color stool, liver problems, rectal bleeding, and  rectal pain. Preventive Screening-Counseling & Management      Drug Use:  no.      Current Medications (verified): 1)  Spiriva Handihaler 18 Mcg Caps (Tiotropium Bromide Monohydrate) .... I Puff Once Daily 2)  Symbicort 160-4.5 Mcg/act Aero (Budesonide-Formoterol Fumarate) .... 2 Puffs Two Times A Day 3)  Prednisone 10 Mg Tabs (Prednisone) .... One Tablet By Mouth Once Daily 4)  Furosemide 20 Mg Tabs (Furosemide) .... 60mg  Daily 5)  Pristiq 50 Mg Xr24h-Tab (Desvenlafaxine Succinate) .... Once Daily 6)  Dicyclomine Hcl 20 Mg Tabs (Dicyclomine Hcl) .... Four Times Daily 30 Minutes Before Each Meal 7)  Alprazolam 2 Mg Tabs (Alprazolam) .... Three Times A Day 8)  Nasonex 50 Mcg/act Susp (Mometasone Furoate) .... 2 Puffs in Each Nostril Once Daily 9)  Xopenex 1.25 Mg/39ml Nebu (Levalbuterol Hcl) .Marland KitchenMarland KitchenMarland Kitchen  As Needed 10)  Ibuprofen 600 Mg Tabs (Ibuprofen) .... As Needed 11)  Align  Caps (Probiotic Product) .Marland Kitchen.. 1 By Mouth Daily 12)  Tramadol Hcl 50 Mg Tabs (Tramadol Hcl) .... One Tablet By Mouth Every 6 Hours  Allergies (verified): 1)   Sudafed 2)  * Dimetapp 3)  Sulfamethoxazole 4)  * Itraconazole 5)  * Hydrocodone/apap 6)  Codeine 7)  * Antihistamines  Past History:  Past medical, surgical, family and social histories (including risk factors) reviewed for relevance to current acute and chronic problems.  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 Lung biopsy hysterectomy BMT Tonsillectomy Partial Hysterectomy Feet Surgery Adenoids removed  Family History: Reviewed history from 02/27/2009 and no changes required. Cancer-mother(cerivcal),  Lung CA-Father HTN Heart problem-father arthritis-father Breast CA- maternal aunt emphsema-father allergies-mother Family History of Colon Polyps:Grandmother  Social History: Reviewed history from 02/27/2009 and no changes required. Patient is a current smoker.  2-3ppd x88yr.  currently smoking about 1 pack a week per pt. Divorced Disabled Alcohol Use - no Illicit Drug Use - no Drug Use:  no  Vital Signs:  Patient profile:   50 year old female Height:      64 inches Weight:      134.50 pounds BMI:     23.17 Pulse rate:   80 / minute Pulse rhythm:   regular BP sitting:   114 / 68  (left arm) Cuff size:   regular  Vitals Entered By: Christie Nottingham CMA Duncan Dull) (September 26, 2009 8:56 AM)  Physical Exam  General:  Well developed, well nourished, no acute distress.healthy appearing.  She is on home oxygen but does not appear dyspneic or short of breath. Head:  Normocephalic and atraumatic. Eyes:  PERRLA, no icterus.exam deferred to patient's ophthalmologist.   Neck:  Supple; no  masses or thyromegaly. Lungs:  decreased BS on L, wheezes bilateral, and rhonchi bilateral.   Heart:  Regular rate and rhythm; no murmurs, rubs,  or bruits. Abdomen:  Her abdomen is not distended or tympanitic. There is no organomegaly, masses, or localized tenderness. Bowel sounds are normal Rectal:  Normal exam.hemocult negative.   Msk:  Symmetrical with no gross deformities. Normal posture. Extremities:  No clubbing, cyanosis, edema or deformities noted. Neurologic:  Alert and  oriented x4;  grossly normal neurologically. Cervical Nodes:  No significant cervical adenopathy. Inguinal Nodes:  No significant inguinal adenopathy. Psych:  anxious and poor concentration.     Impression & Recommendations:  Problem # 1:  IBS (ICD-564.1) Assessment Unchanged This patient has nonspecific GI complaints of gas and bloating consistent with interval bowel syndrome perhaps exacerbated by anticholinergic therapy. I do not think that she has other GI pathology, and she has had an excellent evaluation by Dr. Chales Abrahams the does not need to be repeated. I think her chance of improvement is poor. Obviously she has chronic anxiety and depression and has concentration problems perhaps related to her severe pulmonary disease and history of chronic pulmonary hypertension. There is no evidence of ascites or underlying liver disease or protein calorie malnutrition. I have stopped her anticholinergics and we'll try Amitiza 8 micrograms twice a day along with MiraLax and is at bedtime for constipation. I also have stopped Align and we'll try [facility] [facility] Colon  Health twice a day. I do not think she has bacterial overgrowth syndrome. Obviously at this time she is dependent on orthotics and benzodiazepines. I have asked her to avoid major lactose products, sorbitol and fructose.  Orders: TLB-Potassium (K+) (84132-K) TLB-B12, Serum-Total ONLY (16109-U04) TLB-Ferritin (82728-FER) TLB-Folic Acid (Folate) (82746-FOL) TLB-IBC Pnl  (Iron/FE;Transferrin) (83550-IBC) TLB-Amylase (82150-AMYL) TLB-Lipase (83690-LIPASE) TLB-Magnesium (Mg) (83735-MG) TLB-Sedimentation Rate (ESR) (85652-ESR) TLB-T3, Free (Triiodothyronine) (84481-T3FREE) TLB-T3 Uptake (84479-T3UP) TLB-T4 (Thyrox), Total [phone]) TLB-TSH (Thyroid Stimulating Hormone) (84443-TSH) T-Beta Carotene [phone]) T-Sprue Panel (Celiac Disease Aby Eval) (83516x3/[address]) TLB-IgA (Immunoglobulin A) (82784-IGA) T-Trypsinogen Serum (13086-57846)  Problem # 2:  CONSTIPATION (ICD-564.00) Assessment: Unchanged Constipation predominant IBS as per above. Labs have been ordered but I suspect these will be normal. Orders: TLB-Potassium (K+) (84132-K) TLB-B12, Serum-Total ONLY (96295-M84) TLB-Ferritin (82728-FER) TLB-Folic Acid (Folate) (82746-FOL) TLB-IBC Pnl (Iron/FE;Transferrin) (83550-IBC) TLB-Amylase (82150-AMYL) TLB-Lipase (83690-LIPASE) TLB-Magnesium (Mg) (83735-MG) TLB-Sedimentation Rate (ESR) (85652-ESR) TLB-T3, Free (Triiodothyronine) (84481-T3FREE) TLB-T3 Uptake (84479-T3UP) TLB-T4 (Thyrox), Total [phone]) TLB-TSH (Thyroid Stimulating Hormone) (84443-TSH) T-Beta Carotene [phone]) T-Sprue Panel (Celiac Disease Aby Eval) (83516x3/[address]) TLB-IgA (Immunoglobulin A) (82784-IGA) T-Trypsinogen Serum (03474-25956)  Problem # 3:  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 Delton Coombes. This patient probably does have chronic pulmonary hypertension which seems to be under fairly good control at this time. There certainly is no evidence of ascites or anasarca.  Problem # 4:  GENERALIZED ANXIETY DISORDER (ICD-300.02) Assessment: Deteriorated She Is Chronically on tramadol and benzodiazepines. I wonder if this patient does not have underlying psychiatric disorders that need further evaluation. I will leave this up to Dr. Delton Coombes and her primary care  physician Dr.Haque.  Patient Instructions: 1)  Please schedule a follow-up appointment in 1 month.  2)  Stop taking Align. 3)  Stop taking dicyclomine. 4)  Begin [facility] Colon Health daily. Purchace OTC. 5)  Prescriptionshave been sent to your pharmacy for Amitiizia and Aciphex.   6)  Use Miralax each night. purchase OTC. 7)  The medication list was reviewed and reconciled.  All changed / newly prescribed medications were explained.  A complete medication list was provided to the patient / caregiver. 8)  Copy sent to : Dr. Levy Pupa and Dr. Donnel Saxon 9)  Excessive Gas Diet handout given.  10)  Lactose Free Diet handout given.  Prescriptions: ACIPHEX 20 MG  TBEC (RABEPRAZOLE SODIUM) Take 1 each day 30 minutes before meals  #30 x 6   Entered by:   Ashok Cordia RN   Authorized by:   Mardella Layman MD [facility]   Signed by:   Ashok Cordia RN on 09/26/2009   Method used:   Electronically to        [facility] (retail)       [address]       [place], [place]  [id number]       Ph: [phone]       Fax: [phone]   RxID:   [id number] AMITIZA 8 MCG  CAPS (LUBIPROSTONE) 1 two times a day/take with food and water  #60 x 3   Entered by:   Ashok Cordia RN   Authorized by:   Mardella Layman MD [facility]   Signed by:   Ashok Cordia RN on 09/26/2009   Method used:   Electronically to        [facility] (retail)       [address]       [place], [place]  [id number]       Ph: [phone]       Fax: [phone]   RxID:   [id number]

## 2010-09-23 NOTE — Progress Notes (Signed)
Summary: lightheaded, joint pain  Phone Note Call from Patient Call back at Home Phone [phone]   Caller: Patient Call For: Korryn Pancoast Summary of Call: Pt called to report that she is experiencing lightheadedness when she gets up, she is having severe L knee pain. We have been weaning her prednisone, currently on 4mg  once daily, with plans to decrease by 1mg  each month. Symptoms have been occuring for the last 10 days. I have asked her to go back to 5mg  prednisone once daily until our next visit to see if this is due to adrenal insufficiency. She will call us to report any change in symptoms.  Initial call taken by: Leslye Peer MD,  December 14, 2009 6:55 AM    New/Updated Medications: PREDNISONE 5 MG/5ML SOLN (PREDNISONE) 1 by mouth once daily Prescriptions: PREDNISONE 5 MG/5ML SOLN (PREDNISONE) 1 by mouth once daily  #30 x 3   Entered and Authorized by:   Leslye Peer MD   Signed by:   Leslye Peer MD on 12/14/2009   Method used:   Electronically to        [facility] (retail)       [address]       [place], [place]  [id number]       Ph: [phone]       Fax: [phone]   RxID:   [id number]

## 2010-09-23 NOTE — Assessment & Plan Note (Signed)
Summary: COPD, COP, nevus   Visit Type:  Follow-up Copy to:  Levy Pupa, MD Primary Provider/Referring Provider:  Dr. Donnel Saxon  CC:  1 mo follow up.  c/o SOB at night x1 wk-relates this to a "cold."  States she is sleeping on pillows to help with this.  Otherwise breathing is the "doing good."  states she is wheezing "all the time."  denies cough.  .  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 Hospitalized for resp failure at [facility] 11/08 requiring BiPAP. Frequent decompensations that seem to come after URI's or allergies, characterized by wheezing, more dyspnea, mid chest pain, cough productive of yellow phlegm.   ROV 05/31/09 -- returns for follow up, torsemide added to lasix in order to better treat possible ascites. Breathing has been fairly stable. Currently on Pred 15mg  once daily. No hospitalizations since last visit. Has been wearing her O2 with most exertion. On Symbicort and Spiriva.   07/19/09 -- Hx of BOOP, ? 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 Last time I decreased her Prednisone to 10mg  qd. Returns for follow up. Had CT scan of the abdomen 3 weeks ago, was told that it was normal with exception of ovarian cysts. Has seen Dr Ardelle Park, currently being treated for COPD exacerbation +/- PNA with avelox x 2 weeks and increased her prednisone to 15mg  qd. She presented with URI symptoms to him. Her cough and sputum production have improved with the abx. Smoking is significantly decreased, hasn't quit yet.   ROV 09/06/09 -- Caught a cold a few weeks ago, went to [place] ED and got levaquin, no change in Pred, currently on 10mg . Still with some cough, rarely productive. Severe exertional dyspnea. She couldn't tell any improvement after the abx. Smoking less because she is too SOB, pack lasts a week.   ROV 10/07/09 -- Tells me that she has had a recent URI, no exacerbation. Prednisone 10mg  once daily. Taking Spiriva  and Symbicort reliably. Wears O2. Minimal SABA use. Smoking 1/3 pk/day.     Current Medications (verified): 1)  Spiriva Handihaler 18 Mcg Caps (Tiotropium Bromide Monohydrate) .... I Puff Once Daily 2)  Symbicort 160-4.5 Mcg/act Aero (Budesonide-Formoterol Fumarate) .... 2 Puffs Two Times A Day 3)  Prednisone 10 Mg Tabs (Prednisone) .... One Tablet By Mouth Once Daily 4)  Furosemide 20 Mg Tabs (Furosemide) .... 60mg  Daily As Needed 5)  Pristiq 50 Mg Xr24h-Tab (Desvenlafaxine Succinate) .... Once Daily 6)  Alprazolam 2 Mg Tabs (Alprazolam) .... Three Times A Day As Needed 7)  Nasonex 50 Mcg/act Susp (Mometasone Furoate) .... 2 Puffs in Each Nostril Once Daily 8)  Xopenex 1.25 Mg/74ml Nebu (Levalbuterol Hcl) .... As Needed 9)  Ibuprofen 600 Mg Tabs (Ibuprofen) .... As Needed 10)  Tramadol Hcl 50 Mg Tabs (Tramadol Hcl) .... One Tablet By Mouth Every 6 Hours 11)  Amitiza 8 Mcg  Caps (Lubiprostone) .Marland Kitchen.. 1 Two Times A Day/take With Food and Water 12)  Aciphex 20 Mg  Tbec (Rabeprazole Sodium) .... Take 1 Each Day 30 Minutes Before Meals 13)  [facility] Colon Health  Caps (Probiotic Product) .... Bid 14)  Miralax   Powd (Polyethylene Glycol 3350) .... Use Each Night  Allergies (verified): 1)  Sudafed 2)  * Dimetapp 3)  Sulfamethoxazole 4)  * Itraconazole 5)  * Hydrocodone/apap 6)  Codeine 7)  * Antihistamines  Vital Signs:  Patient profile:   50 year old female  Height:      64 inches Weight:      134.25 pounds O2 Sat:      99 % on 3 L/mincont Temp:     97.7 degrees F oral Pulse rate:   117 / minute BP sitting:   112 / 70  (left arm) Cuff size:   regular  Vitals Entered By: Gweneth Dimitri RN (October 07, 2009 12:17 PM)  O2 Flow:  3 L/mincont CC: 1 mo follow up.  c/o SOB at night x1 wk-relates this to a "cold."  States she is sleeping on pillows to help with this.  Otherwise breathing is the "doing good."  states she is wheezing "all the time."  denies cough.   Comments Medications  reviewed with patient Daytime contact number verified with patient. Gweneth Dimitri RN  October 07, 2009 12:18 PM    Physical Exam  General:  Illl-appearing woman Head:  normocephalic and atraumatic, cushingoid features, there is a raised irregular mole on L cheek below th eye.  Eyes:  conjunctiva and sclera clear, no icterus Mouth:  no deformity or lesions Lungs:  coarse bilaterally with inspiratory rhonchi and squeeks, no exp wheeze.  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   Problem # 3:  NEVUS (ICD-216.9)  Will ask Dr Ardelle Park to refer her to dermatologist  Orders: Est. Patient Level IV (16109)  Medications Added to Medication List This Visit: 1)  Furosemide 20 Mg Tabs (Furosemide) .... 60mg  daily as needed 2)  Alprazolam 2 Mg Tabs (Alprazolam) .... Three times a day as needed  Patient Instructions: 1)  Continue your inhaled medications and your oxygen.  2)  Decrease your prednisone to 5mg  by mouth once daily 3)  Follow up with Dr Delton Coombes in 1 month. We will plan further decreases in your prednisone at that time.  4)  You need to ask Dr Ardelle Park to refer you to a dermatologist to evaluate the mole on your face.

## 2010-09-23 NOTE — Assessment & Plan Note (Signed)
Summary: COPD, RB-ILD/COP   Visit Type:  Follow-up Copy to:  Levy Pupa, MD Primary Provider/Referring Provider:  Dr. Donnel Saxon  CC:  COPD. The patient c/o increased sob with any exertion. Per patient she was recently an inpatient at [facility] for sepsis.. .  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 Hospitalized for resp failure at [facility] 11/08 requiring BiPAP. Frequent decompensations that seem to come after URI's or allergies, characterized by wheezing, more dyspnea, mid chest pain, cough productive of yellow phlegm.   07/19/09 -- Hx of BOOP, ? 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 Last time I decreased her Prednisone to 10mg  qd. Returns for follow up. Had CT scan of the abdomen 3 weeks ago, was told that it was normal with exception of ovarian cysts. Has seen Dr Ardelle Park, currently being treated for COPD exacerbation +/- PNA with avelox x 2 weeks and increased her prednisone to 15mg  qd. She presented with URI symptoms to him. Her cough and sputum production have improved with the abx. Smoking is significantly decreased, hasn't quit yet.   ROV 09/06/09 -- Caught a cold a few weeks ago, went to [place] ED and got levaquin, no change in Pred, currently on 10mg . Still with some cough, rarely productive. Severe exertional dyspnea. She couldn't tell any improvement after the abx. Smoking less because she is too SOB, pack lasts a week.   ROV 10/07/09 -- Tells me that she has had a recent URI, no exacerbation. Prednisone 10mg  once daily. Taking Spiriva and Symbicort reliably. Wears O2. Minimal SABA use. Smoking 1/3 pk/day.   ROV 11/08/09 -- Returns for follow up. Tells me that she was admitted to [facility] end of February, apparently dx with CAP, ? bacteremia. Was exposed to people w/ URI prior to that illness. Was treated with pred burst, then back down to 5mg  once daily (we had decreased to 5mg  once daily at our last  visit). Dr Stefanie Libel office has given her a script to decrease the Pred by 1mg  qweek until down to zero. Smoking about 4 cigarettes a week!    Current Medications (verified): 1)  Spiriva Handihaler 18 Mcg Caps (Tiotropium Bromide Monohydrate) .... I Puff Once Daily 2)  Symbicort 160-4.5 Mcg/act Aero (Budesonide-Formoterol Fumarate) .... 2 Puffs Two Times A Day 3)  Prednisone 10 Mg Tabs (Prednisone) .... One Tablet By Mouth Once Daily 4)  Furosemide 20 Mg Tabs (Furosemide) .... 60mg  Daily As Needed 5)  Pristiq 50 Mg Xr24h-Tab (Desvenlafaxine Succinate) .... Once Daily 6)  Alprazolam 2 Mg Tabs (Alprazolam) .... Three Times A Day As Needed 7)  Nasonex 50 Mcg/act Susp (Mometasone Furoate) .... 2 Puffs in Each Nostril Once Daily 8)  Xopenex 1.25 Mg/6ml Nebu (Levalbuterol Hcl) .... As Needed 9)  Tramadol Hcl 50 Mg Tabs (Tramadol Hcl) .... One Tablet By Mouth Every 6 Hours 10)  Amitiza 8 Mcg  Caps (Lubiprostone) .Marland Kitchen.. 1 Two Times A Day/take With Food and Water   Out 11)  Aciphex 20 Mg  Tbec (Rabeprazole Sodium) .... Take 1 Each Day 30 Minutes Before Meals  Out 12)  Phillips Colon Health  Caps (Probiotic Product) .... Bid 13)  Miralax   Powd (Polyethylene Glycol 3350) .... Use Each Night 14)  Prednisone 5 Mg Tabs (Prednisone) .Marland Kitchen.. 1 By Mouth Daily X1 Week; Tapering Down 1mg  Every Week Until Finished  Allergies (verified): 1)  Sudafed 2)  * Dimetapp 3)  Sulfamethoxazole 4)  *  Itraconazole 5)  * Hydrocodone/apap 6)  Codeine 7)  * Antihistamines  Vital Signs:  Patient profile:   50 year old female Height:      64 inches (162.56 cm) Weight:      132 pounds (60.00 kg) BMI:     22.74 O2 Sat:      99 % on 3 L/min Temp:     98.2 degrees F (36.78 degrees C) oral Pulse rate:   93 / minute BP sitting:   110 / 80  (left arm) Cuff size:   regular  Vitals Entered By: Michel Bickers CMA (November 08, 2009 2:58 PM)  O2 Sat at Rest %:  99 O2 Flow:  3 L/min CC: COPD. The patient c/o increased sob with any  exertion. Per patient she was recently an inpatient at [facility] for sepsis.Marland Kitchen    Physical Exam  General:  Illl-appearing woman Head:  normocephalic and atraumatic, cushingoid features.  Eyes:  conjunctiva and sclera clear, no icterus Mouth:  no deformity or lesions Lungs:  coarse bilaterally with inspiratory rhonchi and squeeks, no exp wheeze.  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 Tapering steroids, although she did have a recent exacerbation +/- PNA that let to steroid burst. Now on 5mg  once daily. Plan in place is for a taper to 0 over 1 month. She will call me if she has symptoms suggestive of adrenal insufficiency. We may need to taper more slowly or to use chronic hydrocort.  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 Will work hard on helping her stop altogether. If she does indeed have RB-ILD, this will help her very much  Orders: Est. Patient Level IV (16109)  Problem # 4:  NEVUS (ICD-216.9) Per Dr Ardelle Park  Medications Added to Medication List This Visit: 1)  Amitiza 8 Mcg Caps (Lubiprostone) .Marland Kitchen.. 1 two times a day/take with food and water   out 2)  Aciphex 20 Mg Tbec (Rabeprazole sodium) .... Take 1 each day 30 minutes before meals  out 3)  Prednisone 5 Mg Tabs (Prednisone) .Marland Kitchen.. 1 by mouth daily x1 week; tapering down 1mg  every week until finished  Patient Instructions: 1)  CONGRATULATIONS on cutting down  your cigarettes!  2)  Taper your prednisone over the next month as written by Dr Stefanie Libel office. Call our office if you develop worsening fatigue, lightheadedness, joint pain, breathing symptoms.  3)  Continue your same inhaled medications.  4)  Follow up with Dr Delton Coombes in 1 month or as needed

## 2010-09-23 NOTE — Progress Notes (Signed)
  Phone Note From Pharmacy   Caller:  [employer] Summary of Call: Pharmacy did not have prednisone solution but had equivalent of prednisolone.  Substituted medication. Initial call taken by: Canary Brim NP-C,  December 14, 2009 8:28 AM

## 2010-09-23 NOTE — Progress Notes (Signed)
Summary: MED DIRECTIONS  Phone Note Call from Patient Call back at Home Phone [phone]   Caller: Patient Call For: [facility] Summary of Call: PT NEEDS CLARIFICATION RE: PREDNISONE DIRECTIONS.  Initial call taken by: Tivis Ringer, CNA,  December 09, 2009 3:07 PM  Follow-up for Phone Call        The pt is aware to stay on 4mg  prednisone through the rest of April. In May she will begin on 3 mg daily and taper down monthly by 1mg . The pt had verbal understanding anda new RX was sent to her pharmacy.Michel Bickers CMA  December 09, 2009 3:31 PM    New/Updated Medications: PREDNISONE 1 MG TABS (PREDNISONE) Taper as directed monthly Prescriptions: PREDNISONE 1 MG TABS (PREDNISONE) Taper as directed monthly  #100 x 1   Entered by:   Michel Bickers CMA   Authorized by:   Leslye Peer MD   Signed by:   Michel Bickers CMA on 12/09/2009   Method used:   Electronically to        [facility] (retail)       [address]       [place], [place]  [id number]       Ph: [phone]       Fax: [phone]   RxID:   [id number]

## 2011-01-06 NOTE — Discharge Summary (Signed)
Krystal Cantu, Krystal Cantu NO.:  [id number]   MEDICAL RECORD NO.:  [id number]          PATIENT TYPE:  INP   LOCATION:  2029                         FACILITY:  [facility]   PHYSICIAN:  Oretha Milch, MD      DATE OF BIRTH:  15-Nov-1960   DATE OF ADMISSION:  07/22/2007  DATE OF DISCHARGE:  07/29/2007                               DISCHARGE SUMMARY   HISTORY OF PRESENT ILLNESS:  Ms. Horney is a 50 year old white female  with a history of bronchiolitis obliterans-organized pneumonia admitted  to [facility] on November 26 with fever, dyspnea, white count of  16.9 with bands, hypoxia with pO2 of 58% on room air. Her chest x-ray  showed patchy opacities bibasilar. CT also showed air space disease,  mild traction bronchiectasis, and she was transferred to [facility] on  November 28 for worsening hypoxia.   Her discharge diagnoses consist of:  1. Acute-on-chronic hypercarbic respiratory failure.  2. Chronic tobacco use.  3. Chronic pain.   LABORATORY DATA:  Chest x-ray on December 5 demonstrates diffuse  airspace disease, slight overall improvement of aeration of the lungs.  CT of the chest on November 29 demonstrates mild mediastinal  lymphadenopathy, diffuse interstitial opacity bilateral airspace  disease, features could be related atypical infection, hypersensitivity,  inflammatory etiology. On November 30, swallowing evaluation  demonstrates swallowing function was performed without aspiration at  this time. Hemoglobin 10.9, hematocrit 31.2, platelets 392, WBCs 16.3.  Sodium 138, potassium 4.8, chloride 99, CO2 31, BUN 22, creatinine 0.76,  glucose 143, calcium 9.0, albumin 2.3, phosphorus 2.8, magnesium 2.5.  Legionella urine was negative. Strep pneumonia and urinary was negative.  Influenza A and B were negative. Blood cultures showed no growth. No  positive culture data. Angiotensin converting enzyme was noted to be 6.  BNP was noted to be 254. ANA was noted to be  negative. Scleroderma AB  was negative.   HOSPITAL COURSE BY DISCHARGE DIAGNOSES:  1. Acute-on-chronic respiratory failure in the setting of      bronchiolitis obliterans-organized pneumonia. She has documented      BOOP by lung biopsy performed in 2006 by Dr. Dimas Chyle at [place],      [place]. She presented to the [facility] after      being treated two days at [facility]. She was treated      with antibiotics, steroids, nebulized bronchodilators and has been      proven to be O2 dependant by desaturating in the 80s when      ambulating in the hallway. She will be discharged home on 40 mg of      prednisone daily until she follows with Dr. Delbert Phenix in one week.      She will also continue on her O2 and will be Augmentin      antimicrobial therapy for a further seven days. Again, she will      follow up with Dr. Delbert Phenix in one week.  2. Chronic tobacco abuse. She has been instructed to stop smoking.  3. Chronic pain. She takes Lyrica. She has bulging disk  in her lower      lumbar spine and is adverse to having surgical intervention.  4. Anxiety. She remains on her benzodiazepines.  5. Hyperglycemia, which was better when she was changed to p.o.      steroids.  6. Chronic anemia which remains stable and does not require any      intervention.   DISCHARGE MEDICATIONS:  1. She will be on prednisone 40 mg b.i.d. until she sees Dr. Delbert Phenix.  2. Xanax 0.25 mg 3 times a day. She gets her prescription from Dr.      Lendon Colonel.  3. She is on Lyrica 100 mg t.i.d.  4. Prevacid 30 mg 1 b.i.d.  5. Xopenex 0.25 mg nebulizers 3 to 4 times a day.  6. Spiriva 18 mcg 1 inhalation daily.  7. Augmentin 875 mg b.i.d. until gone.  8. Home O2 with 2 liters nasal cannula.   DIET:  Heart healthy diet.   She is to follow up with Dr. Delbert Phenix.  There will be no need for her to  follow up with the pulmonary critical care division in [place]. She  has been followed by  pulmonologist in [place].      Devra Dopp, MSN, ACNP      Oretha Milch, MD  Electronically Signed    SM/MEDQ  D:  07/29/2007  T:  07/29/2007  Job:  [id number]   cc:   Rachael Darby, M.D.  Dimas Chyle, M.D.  Lendon Colonel, M.D.

## 2011-06-01 LAB — CBC
HCT: 30.4 — ABNORMAL LOW
HCT: 31.2 — ABNORMAL LOW
Hemoglobin: 10.4 — ABNORMAL LOW
Hemoglobin: 10.9 — ABNORMAL LOW
MCHC: 34.1
MCV: 94.5
RBC: 3.31 — ABNORMAL LOW
RDW: 14.8
WBC: 13.8 — ABNORMAL HIGH
WBC: 16.3 — ABNORMAL HIGH

## 2011-06-01 LAB — BASIC METABOLIC PANEL
BUN: 17
BUN: 22
CO2: 31
Calcium: 9
Chloride: 101
Chloride: 99
GFR calc Af Amer: >60
GFR calc Af Amer: >60
GFR calc non Af Amer: >60
Glucose, Bld: 143 — ABNORMAL HIGH
Potassium: 3.7
Sodium: 138
Sodium: 138

## 2011-06-01 LAB — PHOSPHORUS: Phosphorus: 2.8

## 2011-06-01 LAB — COMPREHENSIVE METABOLIC PANEL
Alkaline Phosphatase: 71
BUN: 15
Calcium: 8.2 — ABNORMAL LOW
Glucose, Bld: 151 — ABNORMAL HIGH
Sodium: 142
Total Bilirubin: 0.6
Total Protein: 5 — ABNORMAL LOW

## 2011-06-01 LAB — LEGIONELLA ANTIGEN, URINE

## 2011-06-02 LAB — POCT I-STAT 3, ART BLOOD GAS (G3+)
Acid-Base Excess: 8 — ABNORMAL HIGH
Acid-Base Excess: 9 — ABNORMAL HIGH
Bicarbonate: 33.2 — ABNORMAL HIGH
Bicarbonate: 33.3 — ABNORMAL HIGH
O2 Saturation: 97
Operator id: 135351
Operator id: 299371
Patient temperature: 97.8
pCO2 arterial: 43.4
pCO2 arterial: 45.9 — ABNORMAL HIGH
pCO2 arterial: 48.3 — ABNORMAL HIGH
pH, Arterial: 7.466 — ABNORMAL HIGH
pO2, Arterial: 108 — ABNORMAL HIGH
pO2, Arterial: 80

## 2011-06-02 LAB — CULTURE, BLOOD (ROUTINE X 2): Culture: NO GROWTH

## 2011-06-02 LAB — BASIC METABOLIC PANEL
BUN: 11
CO2: 28
CO2: 31
CO2: 31
Calcium: 8.7
Chloride: 103
Chloride: 104
Creatinine, Ser: 0.76
GFR calc Af Amer: >60
GFR calc Af Amer: >60
GFR calc non Af Amer: >60
GFR calc non Af Amer: >60
Potassium: 3.9
Potassium: 4.9
Sodium: 142

## 2011-06-02 LAB — B-NATRIURETIC PEPTIDE (CONVERTED LAB): Pro B Natriuretic peptide (BNP): 254 — ABNORMAL HIGH

## 2011-06-02 LAB — INFLUENZA A+B VIRUS AG-DIRECT(RAPID): Influenza B Ag: NEGATIVE

## 2011-06-02 LAB — CBC
Hemoglobin: 10.7 — ABNORMAL LOW
Hemoglobin: 11.4 — ABNORMAL LOW
MCHC: 33.2
MCV: 95.9
MCV: 97.1
Platelets: 354
Platelets: 358
RBC: 3.31 — ABNORMAL LOW
RBC: 3.48 — ABNORMAL LOW
RBC: 3.48 — ABNORMAL LOW
WBC: 12.1 — ABNORMAL HIGH
WBC: 18 — ABNORMAL HIGH
WBC: 21 — ABNORMAL HIGH

## 2011-06-02 LAB — DIFFERENTIAL
Eosinophils Absolute: 0 — ABNORMAL LOW
Lymphs Abs: 0.6 — ABNORMAL LOW
Monocytes Absolute: 0.7
Monocytes Relative: 5
Neutro Abs: 10.9 — ABNORMAL HIGH
Neutrophils Relative %: 90 — ABNORMAL HIGH

## 2011-06-02 LAB — PHOSPHORUS: Phosphorus: 3.1

## 2011-06-02 LAB — URINE CULTURE
Colony Count: NO GROWTH
Culture: NO GROWTH

## 2011-06-02 LAB — ANA: Anti Nuclear Antibody(ANA): NEGATIVE

## 2011-06-02 LAB — ANTI-SCLERODERMA ANTIBODY: Scleroderma (Scl-70) (ENA) Antibody, IgG: <0.2 AI (ref <1.0)

## 2011-06-02 LAB — LEGIONELLA ANTIGEN, URINE

## 2011-06-02 LAB — STREP PNEUMONIAE URINARY ANTIGEN: Strep Pneumo Urinary Antigen: NEGATIVE

## 2012-09-15 DIAGNOSIS — M5136 Other intervertebral disc degeneration, lumbar region: Secondary | ICD-10-CM | POA: Insufficient documentation

## 2014-09-18 ENCOUNTER — Institutional Professional Consult (permissible substitution): Payer: Self-pay | Admitting: Critical Care Medicine

## 2014-11-22 ENCOUNTER — Telehealth: Payer: Self-pay

## 2014-11-22 NOTE — Telephone Encounter (Signed)
11/22/14 Disc received from [facility] and filed on shelf.Britt Bottom

## 2017-09-21 DIAGNOSIS — M797 Fibromyalgia: Secondary | ICD-10-CM | POA: Insufficient documentation

## 2017-09-21 DIAGNOSIS — M47814 Spondylosis without myelopathy or radiculopathy, thoracic region: Secondary | ICD-10-CM | POA: Insufficient documentation

## 2017-09-26 DIAGNOSIS — E871 Hypo-osmolality and hyponatremia: Secondary | ICD-10-CM | POA: Diagnosis not present

## 2017-09-26 DIAGNOSIS — J9621 Acute and chronic respiratory failure with hypoxia: Secondary | ICD-10-CM

## 2017-09-26 DIAGNOSIS — J441 Chronic obstructive pulmonary disease with (acute) exacerbation: Secondary | ICD-10-CM | POA: Diagnosis not present

## 2017-09-26 DIAGNOSIS — J14 Pneumonia due to Hemophilus influenzae: Secondary | ICD-10-CM

## 2017-09-26 DIAGNOSIS — R0602 Shortness of breath: Secondary | ICD-10-CM

## 2017-09-26 DIAGNOSIS — A413 Sepsis due to Hemophilus influenzae: Secondary | ICD-10-CM

## 2017-09-26 DIAGNOSIS — E43 Unspecified severe protein-calorie malnutrition: Secondary | ICD-10-CM | POA: Diagnosis not present

## 2017-09-26 DIAGNOSIS — I248 Other forms of acute ischemic heart disease: Secondary | ICD-10-CM | POA: Diagnosis not present

## 2017-09-26 DIAGNOSIS — I5043 Acute on chronic combined systolic (congestive) and diastolic (congestive) heart failure: Secondary | ICD-10-CM | POA: Diagnosis not present

## 2017-09-26 DIAGNOSIS — J440 Chronic obstructive pulmonary disease with acute lower respiratory infection: Secondary | ICD-10-CM | POA: Diagnosis not present

## 2017-09-27 DIAGNOSIS — J14 Pneumonia due to Hemophilus influenzae: Secondary | ICD-10-CM | POA: Diagnosis not present

## 2017-09-27 DIAGNOSIS — J9621 Acute and chronic respiratory failure with hypoxia: Secondary | ICD-10-CM | POA: Diagnosis not present

## 2017-09-27 DIAGNOSIS — E43 Unspecified severe protein-calorie malnutrition: Secondary | ICD-10-CM | POA: Diagnosis not present

## 2017-09-27 DIAGNOSIS — J441 Chronic obstructive pulmonary disease with (acute) exacerbation: Secondary | ICD-10-CM | POA: Diagnosis not present

## 2017-09-27 DIAGNOSIS — A413 Sepsis due to Hemophilus influenzae: Secondary | ICD-10-CM | POA: Diagnosis not present

## 2017-09-27 DIAGNOSIS — I248 Other forms of acute ischemic heart disease: Secondary | ICD-10-CM | POA: Diagnosis not present

## 2017-09-27 DIAGNOSIS — I5043 Acute on chronic combined systolic (congestive) and diastolic (congestive) heart failure: Secondary | ICD-10-CM | POA: Diagnosis not present

## 2017-09-27 DIAGNOSIS — E871 Hypo-osmolality and hyponatremia: Secondary | ICD-10-CM | POA: Diagnosis not present

## 2017-09-27 DIAGNOSIS — J440 Chronic obstructive pulmonary disease with acute lower respiratory infection: Secondary | ICD-10-CM | POA: Diagnosis not present

## 2017-09-27 DIAGNOSIS — R0602 Shortness of breath: Secondary | ICD-10-CM | POA: Diagnosis not present

## 2017-09-28 DIAGNOSIS — R0602 Shortness of breath: Secondary | ICD-10-CM | POA: Diagnosis not present

## 2017-09-28 DIAGNOSIS — A413 Sepsis due to Hemophilus influenzae: Secondary | ICD-10-CM | POA: Diagnosis not present

## 2017-09-28 DIAGNOSIS — J14 Pneumonia due to Hemophilus influenzae: Secondary | ICD-10-CM | POA: Diagnosis not present

## 2017-09-28 DIAGNOSIS — J9621 Acute and chronic respiratory failure with hypoxia: Secondary | ICD-10-CM | POA: Diagnosis not present

## 2017-09-29 DIAGNOSIS — E43 Unspecified severe protein-calorie malnutrition: Secondary | ICD-10-CM | POA: Diagnosis not present

## 2017-09-29 DIAGNOSIS — E871 Hypo-osmolality and hyponatremia: Secondary | ICD-10-CM | POA: Diagnosis not present

## 2017-09-29 DIAGNOSIS — J9621 Acute and chronic respiratory failure with hypoxia: Secondary | ICD-10-CM | POA: Diagnosis not present

## 2017-09-29 DIAGNOSIS — J14 Pneumonia due to Hemophilus influenzae: Secondary | ICD-10-CM | POA: Diagnosis not present

## 2017-09-29 DIAGNOSIS — J440 Chronic obstructive pulmonary disease with acute lower respiratory infection: Secondary | ICD-10-CM | POA: Diagnosis not present

## 2017-09-29 DIAGNOSIS — A413 Sepsis due to Hemophilus influenzae: Secondary | ICD-10-CM | POA: Diagnosis not present

## 2017-09-29 DIAGNOSIS — R0602 Shortness of breath: Secondary | ICD-10-CM | POA: Diagnosis not present

## 2017-09-29 DIAGNOSIS — I248 Other forms of acute ischemic heart disease: Secondary | ICD-10-CM | POA: Diagnosis not present

## 2017-09-29 DIAGNOSIS — J441 Chronic obstructive pulmonary disease with (acute) exacerbation: Secondary | ICD-10-CM | POA: Diagnosis not present

## 2017-09-29 DIAGNOSIS — I5043 Acute on chronic combined systolic (congestive) and diastolic (congestive) heart failure: Secondary | ICD-10-CM | POA: Diagnosis not present

## 2017-09-30 DIAGNOSIS — R0602 Shortness of breath: Secondary | ICD-10-CM | POA: Diagnosis not present

## 2017-09-30 DIAGNOSIS — A413 Sepsis due to Hemophilus influenzae: Secondary | ICD-10-CM | POA: Diagnosis not present

## 2017-09-30 DIAGNOSIS — J9621 Acute and chronic respiratory failure with hypoxia: Secondary | ICD-10-CM | POA: Diagnosis not present

## 2017-09-30 DIAGNOSIS — J14 Pneumonia due to Hemophilus influenzae: Secondary | ICD-10-CM | POA: Diagnosis not present

## 2017-10-01 DIAGNOSIS — R0602 Shortness of breath: Secondary | ICD-10-CM | POA: Diagnosis not present

## 2017-10-01 DIAGNOSIS — A413 Sepsis due to Hemophilus influenzae: Secondary | ICD-10-CM | POA: Diagnosis not present

## 2017-10-01 DIAGNOSIS — J14 Pneumonia due to Hemophilus influenzae: Secondary | ICD-10-CM | POA: Diagnosis not present

## 2017-10-01 DIAGNOSIS — J9621 Acute and chronic respiratory failure with hypoxia: Secondary | ICD-10-CM | POA: Diagnosis not present

## 2017-10-02 DIAGNOSIS — J9621 Acute and chronic respiratory failure with hypoxia: Secondary | ICD-10-CM | POA: Diagnosis not present

## 2017-10-02 DIAGNOSIS — J14 Pneumonia due to Hemophilus influenzae: Secondary | ICD-10-CM | POA: Diagnosis not present

## 2017-10-02 DIAGNOSIS — R0602 Shortness of breath: Secondary | ICD-10-CM | POA: Diagnosis not present

## 2017-10-02 DIAGNOSIS — A413 Sepsis due to Hemophilus influenzae: Secondary | ICD-10-CM | POA: Diagnosis not present

## 2017-10-03 DIAGNOSIS — J9621 Acute and chronic respiratory failure with hypoxia: Secondary | ICD-10-CM | POA: Diagnosis not present

## 2017-10-03 DIAGNOSIS — A413 Sepsis due to Hemophilus influenzae: Secondary | ICD-10-CM | POA: Diagnosis not present

## 2017-10-03 DIAGNOSIS — J14 Pneumonia due to Hemophilus influenzae: Secondary | ICD-10-CM | POA: Diagnosis not present

## 2017-10-03 DIAGNOSIS — R0602 Shortness of breath: Secondary | ICD-10-CM | POA: Diagnosis not present

## 2017-10-11 ENCOUNTER — Other Ambulatory Visit: Payer: Self-pay

## 2017-10-11 ENCOUNTER — Encounter: Payer: Self-pay | Admitting: Cardiology

## 2017-10-11 ENCOUNTER — Ambulatory Visit: Payer: Medicare Other | Admitting: Cardiology

## 2017-10-11 VITALS — BP 92/56 | Ht 64.0 in | Wt 86.1 lb

## 2017-10-11 DIAGNOSIS — G8929 Other chronic pain: Secondary | ICD-10-CM | POA: Insufficient documentation

## 2017-10-11 DIAGNOSIS — R079 Chest pain, unspecified: Secondary | ICD-10-CM

## 2017-10-11 DIAGNOSIS — R748 Abnormal levels of other serum enzymes: Secondary | ICD-10-CM | POA: Diagnosis not present

## 2017-10-11 DIAGNOSIS — R778 Other specified abnormalities of plasma proteins: Secondary | ICD-10-CM

## 2017-10-11 DIAGNOSIS — I429 Cardiomyopathy, unspecified: Secondary | ICD-10-CM | POA: Diagnosis not present

## 2017-10-11 DIAGNOSIS — F17200 Nicotine dependence, unspecified, uncomplicated: Secondary | ICD-10-CM | POA: Diagnosis not present

## 2017-10-11 DIAGNOSIS — M545 Low back pain, unspecified: Secondary | ICD-10-CM | POA: Insufficient documentation

## 2017-10-11 DIAGNOSIS — R7989 Other specified abnormal findings of blood chemistry: Secondary | ICD-10-CM | POA: Insufficient documentation

## 2017-10-11 NOTE — Progress Notes (Signed)
Cardiology Office Note:    Date:  10/11/2017   ID:  Krystal Cantu, DOB 11/05/1960, MRN [id number]  PCP:  Patient, No Pcp Per  Cardiologist:  Jenean Lindau, MD   Referring MD: No ref. provider found    ASSESSMENT:    1. Chest pain, unspecified type   2. TOBACCO ABUSE   3. Cardiomyopathy, unspecified type ([place])   4. Elevated troponin    PLAN:    In order of problems listed above:  1. I discussed my findings with the patient at extensive length.  Patient has been treated appropriately for sepsis and pneumonia.  Her troponins were elevated and she was put on a low-dose of beta-blocker carvedilol 3.125 mg twice daily.  I think given her frail condition and the fact that she is on oxygen I am a little worried about her getting hypotensive and sustaining a fall which she is going to be devastating condition that she will have to face if such thing happens to her.  In view of this she also feels that she wants to be off beta-blocker because of her blood pressure issues.  I discussed this with her at extensive length and benefits and risks explained and she vocalized understanding and she wants to go for the beta-blocker. 2. Echo cardiogram in the hospital revealed an ejection fraction of about 35% with akinetic septum and akinetic inferoposterior walls.  Enlarged right ventricle.  Moderate mitral regurgitation and moderate aortic regurgitation. 3. She will continue aspirin on a regular basis and will be seen in follow-up appointment in a month or earlier if she has any concerns.  She will check with the primary care physician if there is any contraindication for 81 mg of coated aspirin. 4. I spent 5 minutes with the patient discussing solely about smoking. Smoking cessation was counseled. I suggested to the patient also different medications and pharmacological interventions. Patient is keen to try stopping on its own at this time. He will get back to me if he needs any further assistance in  this matter   Medication Adjustments/Labs and Tests Ordered: Current medicines are reviewed at length with the patient today.  Concerns regarding medicines are outlined above.  Orders Placed This Encounter  Procedures  . EKG 12-Lead   No orders of the defined types were placed in this encounter.    History of Present Illness:    Krystal Cantu is a 57 y.o. female who is being seen today for the evaluation of cardiomyopathy and elevated troponin.  The patient has very advanced COPD and is on home oxygen and unfortunately smokes.  She is extremely frail.  It appears she was admitted to the hospital with pneumonia and sepsis according to the hospital records which I reviewed extensively.  She was treated for this she had a elevated troponin for which reason she was also treated.  She received beta-blockers for this.  She denies any chest pain orthopnea or PND.  She takes care of activities of daily living.  She is very sedentary secondary to her advanced pulmonary condition.  History reviewed. No pertinent past medical history.  History reviewed. No pertinent surgical history.  Current Medications: Current Meds  Medication Sig  . ALPRAZolam (XANAX) 1 MG tablet   . atorvastatin (LIPITOR) 20 MG tablet   . BREO ELLIPTA 100-25 MCG/INH AEPB INHALE 1 PUFF PO ONCE D  . budesonide-formoterol (SYMBICORT) 160-4.5 MCG/ACT inhaler Inhale into the lungs.  . Cholecalciferol (VITAMIN D-1000 MAX ST) 1000 units tablet Take by  mouth.  . clonazePAM (KLONOPIN) 1 MG tablet   . diclofenac (FLECTOR) 1.3 % PTCH Apply topically.  Marland Kitchen esomeprazole (NEXIUM) 40 MG capsule   . fluticasone (FLONASE) 50 MCG/ACT nasal spray U 1 SPR IEN ONCE D  . gabapentin (NEURONTIN) 300 MG capsule   . HYDROmorphone (DILAUDID) 2 MG tablet Take by mouth.  . IRON PO Take by mouth.  . levalbuterol (XOPENEX) 1.25 MG/3ML nebulizer solution Take 1 ampule by nebulization every 4 (four) hours as needed.  Lita Mains 0.5-0.3 %  SUSP Apply to eye.  Marland Kitchen oxyCODONE-acetaminophen (PERCOCET) 10-325 MG tablet   . predniSONE (STERAPRED UNI-PAK 21 TAB) 10 MG (21) TBPK tablet   . PROAIR HFA 108 (90 Base) MCG/ACT inhaler   . traMADol (ULTRAM) 50 MG tablet Take one to two tablets up to three times a day  . venlafaxine XR (EFFEXOR-XR) 37.5 MG 24 hr capsule      Allergies:   Ceftriaxone; Pregabalin; Codeine; Itraconazole; Pseudoephedrine; and Sulfamethoxazole   Social History   Socioeconomic History  . Marital status: Divorced    Spouse name: None  . Number of children: None  . Years of education: None  . Highest education level: None  Social Needs  . Financial resource strain: None  . Food insecurity - worry: None  . Food insecurity - inability: None  . Transportation needs - medical: None  . Transportation needs - non-medical: None  Occupational History  . None  Tobacco Use  . Smoking status: Former Research scientist (life sciences)  . Smokeless tobacco: Former Network engineer and Sexual Activity  . Alcohol use: None  . Drug use: None  . Sexual activity: None  Other Topics Concern  . None  Social History Narrative  . None     Family History: The patient's family history is not on file.  ROS:   Please see the history of present illness.    All other systems reviewed and are negative.  EKGs/Labs/Other Studies Reviewed:    The following studies were reviewed today: I reviewed hospital records extensively.  EKG reveals sinus rhythm and nonspecific ST-T changes.   Recent Labs: No results found for requested labs within last 8760 hours.  Recent Lipid Panel No results found for: CHOL, TRIG, HDL, CHOLHDL, VLDL, LDLCALC, LDLDIRECT  Physical Exam:    VS:  BP (!) 92/56 (BP Location: Left Arm, Patient Position: Sitting, Cuff Size: Normal)   Ht 5\' 4"  (1.626 m)   Wt 86 lb 1.9 oz (39.1 kg)   SpO2 99%   BMI 14.78 kg/m     Wt Readings from Last 3 Encounters:  10/11/17 86 lb 1.9 oz (39.1 kg)     GEN: Patient is in no acute  distress HEENT: Normal NECK: No JVD; No carotid bruits LYMPHATICS: No lymphadenopathy CARDIAC: S1 S2 regular, 2/6 systolic murmur at the apex. RESPIRATORY:  Clear to auscultation without rales, wheezing or rhonchi the air sounds bilaterally are very distant, typical of advanced COPD. ABDOMEN: Soft, non-tender, non-distended MUSCULOSKELETAL:  No edema; No deformity  SKIN: Warm and dry NEUROLOGIC:  Alert and oriented x 3 PSYCHIATRIC:  Normal affect    Signed, Jenean Lindau, MD  10/11/2017 4:12 PM    [facility]

## 2017-10-11 NOTE — Patient Instructions (Signed)
Medication Instructions:  Your physician recommends that you continue on your current medications as directed. Please refer to the Current Medication list given to you today.  Labwork: None  Testing/Procedures: None  Follow-Up: Your physician recommends that you schedule a follow-up appointment in: 1 month  Any Other Special Instructions Will Be Listed Below (If Applicable).     If you need a refill on your cardiac medications before your next appointment, please call your pharmacy.   [facility]  Kada Friesen A, RN, BSN  

## 2017-10-14 ENCOUNTER — Other Ambulatory Visit (INDEPENDENT_AMBULATORY_CARE_PROVIDER_SITE_OTHER): Payer: Medicare Other

## 2017-10-14 ENCOUNTER — Ambulatory Visit: Payer: Medicare Other | Admitting: Internal Medicine

## 2017-10-14 ENCOUNTER — Ambulatory Visit (INDEPENDENT_AMBULATORY_CARE_PROVIDER_SITE_OTHER)
Admission: RE | Admit: 2017-10-14 | Discharge: 2017-10-14 | Disposition: A | Discharge status: Home / Self Care | Payer: Medicare Other | Source: Ambulatory Visit | Attending: Internal Medicine | Admitting: Internal Medicine

## 2017-10-14 ENCOUNTER — Encounter: Payer: Self-pay | Admitting: Internal Medicine

## 2017-10-14 VITALS — BP 106/60 | HR 108 | Ht 64.5 in | Wt 89.0 lb

## 2017-10-14 DIAGNOSIS — J9611 Chronic respiratory failure with hypoxia: Secondary | ICD-10-CM | POA: Diagnosis not present

## 2017-10-14 DIAGNOSIS — J9612 Chronic respiratory failure with hypercapnia: Secondary | ICD-10-CM

## 2017-10-14 DIAGNOSIS — J449 Chronic obstructive pulmonary disease, unspecified: Secondary | ICD-10-CM

## 2017-10-14 DIAGNOSIS — J849 Interstitial pulmonary disease, unspecified: Secondary | ICD-10-CM

## 2017-10-14 DIAGNOSIS — R0609 Other forms of dyspnea: Secondary | ICD-10-CM

## 2017-10-14 LAB — CBC WITH DIFFERENTIAL/PLATELET
BASOS PCT: 0.5 % (ref 0.0–3.0)
Basophils Absolute: 0.1 10*3/uL (ref 0.0–0.1)
Eosinophils Absolute: 0 10*3/uL (ref 0.0–0.7)
Eosinophils Relative: 0.1 % (ref 0.0–5.0)
HCT: 33.9 % — ABNORMAL LOW (ref 36.0–46.0)
HEMOGLOBIN: 11.3 g/dL — AB (ref 12.0–15.0)
LYMPHS ABS: 0.7 10*3/uL (ref 0.7–4.0)
Lymphocytes Relative: 4.9 % — ABNORMAL LOW (ref 12.0–46.0)
MCHC: 33.3 g/dL (ref 30.0–36.0)
MCV: 98 fl (ref 78.0–100.0)
MONO ABS: 0.4 10*3/uL (ref 0.1–1.0)
Monocytes Relative: 2.8 % — ABNORMAL LOW (ref 3.0–12.0)
NEUTROS ABS: 13.5 10*3/uL — AB (ref 1.4–7.7)
Neutrophils Relative %: 91.7 % — ABNORMAL HIGH (ref 43.0–77.0)
PLATELETS: 686 10*3/uL — AB (ref 150.0–400.0)
RBC: 3.46 Mil/uL — ABNORMAL LOW (ref 3.87–5.11)
RDW: 17.5 % — AB (ref 11.5–15.5)
WBC: 14.7 10*3/uL — ABNORMAL HIGH (ref 4.0–10.5)

## 2017-10-14 LAB — BRAIN NATRIURETIC PEPTIDE: PRO B NATRI PEPTIDE: 854 pg/mL — AB (ref 0.0–100.0)

## 2017-10-14 LAB — BASIC METABOLIC PANEL
BUN: 33 mg/dL — ABNORMAL HIGH (ref 6–23)
CALCIUM: 9.1 mg/dL (ref 8.4–10.5)
CO2: 36 meq/L — AB (ref 19–32)
CREATININE: 0.91 mg/dL (ref 0.40–1.20)
Chloride: 95 mEq/L — ABNORMAL LOW (ref 96–112)
GFR: 67.71 mL/min (ref >60.00)
Glucose, Bld: 163 mg/dL — ABNORMAL HIGH (ref 70–99)
Potassium: 4.3 mEq/L (ref 3.5–5.1)
SODIUM: 137 meq/L (ref 135–145)

## 2017-10-14 LAB — TSH: TSH: 0.56 u[IU]/mL (ref 0.35–4.50)

## 2017-10-14 LAB — SEDIMENTATION RATE: Sed Rate: 37 mm/hr — ABNORMAL HIGH (ref 0–30)

## 2017-10-14 NOTE — Progress Notes (Signed)
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 done in 2006 prev followed by [facility] in [place]  on pred typically about a month and then good in between for up to she says as long several years but has become 02 dep 2015 and maint BREO / proair in between courses of prednisone since around 2015 so referred to pulmonary clinic 10/14/2017 by Dr   Merita Norton at [place] admit  09/26/17 - 10/03/17 [facility]  1) acute on chronic resp failure pna/aecopd/boop/ chf all listed as possible components  - sputum rew hf flu  - DNR status confirmed per d/c summary  2) CHF with elevated bnp and troponin - EF 35%  -  Cards turned down for cath ([place] )      10/14/2017 1st [name]  / presently on pred 20 mg daily  Chief Complaint  Patient presents with  . Pulmonary Consult    Referred by Dr. Merita Norton at [place].  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    at best still on 2lpm to 3 lpm and can walk up to 10 min at slow pace / on BREO and rarely needing saba Typical flare with sinus draining sore throat  Props up some <30 degrees on 2lpm  Doe x across the room leaning on walker   No obvious day to day or daytime variability or assoc excess/ purulent sputum or mucus plugs or hemoptysis or cp or chest tightness, subjective wheeze or overt sinus or hb symptoms. No unusual exposure hx or h/o childhood pna/ asthma or knowledge of premature birth.  Sleeping ok  On 2lpm at 30 degrees  without nocturnal  or early am exacerbation  of respiratory  c/o's or need for noct saba. Also denies any obvious fluctuation of symptoms with weather or environmental changes or other aggravating or alleviating factors except as outlined above   Current Allergies, Complete Past Medical History, Past Surgical History, Family History, and Social History were reviewed in  [employer] record.  ROS  The following are not active complaints unless bolded Hoarseness, sore throat, dysphagia, dental problems, itching, sneezing,  nasal congestion or discharge of excess mucus or purulent secretions, ear ache,   fever, chills, sweats, unintended wt loss or wt gain, classically pleuritic or exertional cp,  orthopnea pnd or leg swelling, presyncope, palpitations, abdominal pain, anorexia, nausea, vomiting, diarrhea  or change in bowel habits or change in bladder habits, change in stools or change in urine, dysuria, hematuria,  rash, arthralgias, visual complaints, headache, numbness, weakness generalized or ataxia or problems with walking or coordination,  change in mood/affect or memory.        Current Meds  Medication Sig  . ALPRAZolam (XANAX) 1 MG tablet Take 1 mg by mouth 2 (two) times daily as needed.   Marland Kitchen atorvastatin (LIPITOR) 80 MG tablet Take 80 mg by mouth daily.  Marland Kitchen BREO ELLIPTA 100-25 MCG/INH AEPB INHALE 1 PUFF PO ONCE D  . Cholecalciferol (VITAMIN D-1000 MAX ST) 1000 units tablet Take by mouth.  . esomeprazole (NEXIUM) 40 MG capsule Take 40 mg by mouth daily.   . feeding supplement (BOOST HIGH PROTEIN) LIQD Take 1 Container by mouth 2 (two) times daily between meals.  . fluticasone (FLONASE) 50 MCG/ACT nasal spray U 1  SPR IEN ONCE D  . furosemide (LASIX) 40 MG tablet Take 40 mg by mouth daily.  Marland Kitchen gabapentin (NEURONTIN) 300 MG capsule Take 300 mg by mouth 2 (two) times daily.   Marland Kitchen guaiFENesin (MUCINEX) 600 MG 12 hr tablet Take 1,200 mg by mouth 2 (two) times daily.  Marland Kitchen ipratropium-albuterol (DUONEB) 0.5-2.5 (3) MG/3ML SOLN Take 3 mLs by nebulization every 6 (six) hours.  . lidocaine (LIDODERM) 5 % Place 1 patch onto the skin daily. Remove & Discard patch within 12 hours or as directed by MD  . nicotine (NICODERM CQ - DOSED IN MG/24 HOURS) 21 mg/24hr patch Place 21 mg onto the skin daily.  . OXYGEN 2lpm with rest and 3lpm with exertion  .  prednisoLONE 5 MG TABS tablet Take by mouth as directed.  Marland Kitchen PROAIR HFA 108 (90 Base) MCG/ACT inhaler Inhale 2 puffs into the lungs every 6 (six) hours as needed.   . venlafaxine XR (EFFEXOR-XR) 37.5 MG 24 hr capsule Take 37.5 mg by mouth daily with breakfast.          Review of Systems     Objective:   Physical Exam       Pale/ frail edlery wf in w/c >> stated age-  can barely stand s assistance   Wt Readings from Last 3 Encounters:  10/14/17 89 lb (40.4 kg)  10/11/17 86 lb 1.9 oz (39.1 kg)     Vital signs reviewed - Note on arrival 02 sats  98% on 2lpm continuous    HEENT: nl dentition, turbinates bilaterally, and oropharynx. Nl external ear canals without cough reflex   NECK :  without JVD/Nodes/TM/ nl carotid upstrokes bilaterally   LUNGS: no acc muscle use,  slt barrel contour chest with coarse pops/ squeaks on insp bilaterally / no wheeze    CV:  RRR  no s3 or murmur or increase in P2, and no edema   ABD:  soft and nontender with nl inspiratory excursion in the supine position. No bruits or organomegaly appreciated, bowel sounds nl  MS:  Nl gait/ ext warm without deformities, calf tenderness, cyanosis or clubbing No obvious joint restrictions   SKIN: warm and dry without lesions    NEURO:  alert, approp, nl sensorium with  no motor or cerebellar deficits apparent.     CXR PA and Lateral:   10/14/2017 :    I personally reviewed images and agree with radiology impression as follows:   No acute disease in a patient with COPD and chronic interstitial Change.  Labs ordered/ reviewed:      Chemistry      Component Value Date/Time   NA 137 10/14/2017 1536   K 4.3 10/14/2017 1536   CL 95 (L) 10/14/2017 1536   CO2 36 (H) 10/14/2017 1536   BUN 33 (H) 10/14/2017 1536   CREATININE 0.91 10/14/2017 1536      Component Value Date/Time   CALCIUM 9.1 10/14/2017 1536   ALKPHOS 78 03/14/2009 1710   AST 30 03/14/2009 1710   ALT 28 03/14/2009 1710   BILITOT 0.9  03/14/2009 1710        Lab Results  Component Value Date   WBC 14.7 (H) 10/14/2017   HGB 11.3 (L) 10/14/2017   HCT 33.9 (L) 10/14/2017   MCV 98.0 10/14/2017   PLT 686.0 (H) 10/14/2017       EOS  0.0                                                                   10/14/2017       Lab Results  Component Value Date   TSH 0.56 10/14/2017     Lab Results  Component Value Date   PROBNP 854.0 (H) 10/14/2017       Lab Results  Component Value Date   ESRSEDRATE 37 (H) 10/14/2017   ESRSEDRATE 59 (H) 09/26/2009            Assessment:

## 2017-10-14 NOTE — Patient Instructions (Addendum)
Please remember to go to the lab and x-ray department downstairs in the basement  for your tests - we will call you with the results when they are available.   Nexium Take 30-60 min before first meal of the day and Pepcid 20 mg one at bedtime   GERD (REFLUX)  is an extremely common cause of respiratory symptoms just like yours , many times with no obvious heartburn at all.    It can be treated with medication, but also with lifestyle changes including elevation of the head of your bed (ideally with 6 inch  bed blocks),  Smoking cessation, avoidance of late meals, excessive alcohol, and avoid fatty foods, chocolate, peppermint, colas, red wine, and acidic juices such as orange juice.  NO MINT OR MENTHOL PRODUCTS SO NO COUGH DROPS   USE SUGARLESS CANDY INSTEAD (Jolley ranchers or Stover's or Life Savers) or even ice chips will also do - the key is to swallow to prevent all throat clearing. NO OIL BASED VITAMINS - use powdered substitutes.    Please schedule a follow up office visit in 6 weeks, call sooner if needed with pfts on return

## 2017-10-15 ENCOUNTER — Telehealth: Payer: Self-pay | Admitting: Internal Medicine

## 2017-10-15 DIAGNOSIS — J849 Interstitial pulmonary disease, unspecified: Secondary | ICD-10-CM | POA: Insufficient documentation

## 2017-10-15 DIAGNOSIS — J9612 Chronic respiratory failure with hypercapnia: Secondary | ICD-10-CM

## 2017-10-15 DIAGNOSIS — J9611 Chronic respiratory failure with hypoxia: Secondary | ICD-10-CM | POA: Insufficient documentation

## 2017-10-15 NOTE — Telephone Encounter (Signed)
Called and spoke with pt's mother [name] letting her know the results of pt's cxr. Mary expressed understanding. Nothing further needed at this current time.

## 2017-10-15 NOTE — Progress Notes (Signed)
LMTCB on preferred phone number listed for patient. 

## 2017-10-15 NOTE — Assessment & Plan Note (Addendum)
HC03  10/14/2017  =  36 c/w moderate hypercarbia   Adequate control on present rx, reviewed in detail with pt > no change in rx needed   Note pt dnr   Though somewhat paradoxic, when the lung fails to clear C02 properly and pC02 rises the lung then becomes a more efficient scavenger of C02 allowing lower work of breathing and  better C02 clearance albeit at a higher serum pC02 level - this is why pts can look a lot better than their ABG's would suggest and why it's so difficult to prognosticate endstage dz.  It's also why I strongly agree with  DNI status (ventilating pts down to a nl pC02 adversely affects this compensatory mechanism)

## 2017-10-16 ENCOUNTER — Encounter: Payer: Self-pay | Admitting: Internal Medicine

## 2017-10-16 NOTE — Assessment & Plan Note (Addendum)
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  The goal with a chronic steroid responsive illness is always arriving at the lowest effective dose that controls the disease/symptoms and not accepting a set "formula" which is based on statistics or guidelines that don't always take into account patient  variability or the natural hx of the dz in every individual patient, which may well vary over time.  For now therefore I recommend the patient completely taper off the prednisone over the next few weeks with whatever taper is being done fine unless symptoms flare.  In meantime, Use of PPI is associated with improved survival time and with decreased radiologic fibrosis per King's study published in [facility] ([place]) vol 184 p1390.  Dec 2011 and also may have other beneficial effects as per the latest review in [place] vol 193 E6950 Jun 20016.  This may not always be cause and effect, but given how universally unimpressive and expensive  all the other  Drugs developed to day  have been for pf,   rec start  rx ppi / diet/ lifestyle modification and f/u with serial walking sats and lung volumes for now to put more points on the curve / establish firm baseline before considering additional measures.    Total time devoted to counseling  > 50 % of initial 60 min office visit:  review case with pt/ discussion of options/alternatives/ personally creating written customized instructions  in presence of pt  then going over those specific  Instructions directly with the pt including how to use all of the meds but in particular covering each new medication in detail and the difference between the  maintenance= "automatic" meds and the prns using an action plan format for the latter (If this problem/symptom => do that organization reading Left to right).  Please see AVS from this visit for a full list of these instructions which I personally wrote for this pt and  are unique to this visit.

## 2017-10-16 NOTE — Assessment & Plan Note (Addendum)
Spirometry 11/26/09  FEV1 1.00 (35%)  Ratio 84 s curvature   - [facility] 08/2017   revealed an ejection fraction of about 35% with akinetic septum and akinetic inferoposterior walls.  Enlarged right ventricle.  Moderate mitral regurgitation and moderate aortic regurgitation.   No evidence of anemia or thyroid dz but does have moderate elevation of BNP > f/u cards actively ([place])

## 2017-10-16 NOTE — Assessment & Plan Note (Addendum)
Spirometry  11/26/09    FEV1 1.00 (35%)  Ratio 84 with min curvature   Quit smoking 09/2017   Since last pfts 2011 had smoked 8 more years since recently forced to quit by prolonged admit/ nhp so she probably does have copd/ab and breo reasonable for now.

## 2017-10-21 LAB — HYPERSENSITIVITY PNUEMONITIS PROFILE
ASPERGILLUS FUMIGATUS: NEGATIVE
FAENIA RETIVIRGULA: NEGATIVE
PIGEON SERUM: NEGATIVE
S. VIRIDIS: NEGATIVE
T. CANDIDUS: NEGATIVE
T. VULGARIS: NEGATIVE

## 2017-10-21 LAB — ANA: ANA: NEGATIVE

## 2017-10-21 LAB — RHEUMATOID FACTOR: Rhuematoid fact SerPl-aCnc: <14 IU/mL (ref <14)

## 2017-10-21 LAB — CYCLIC CITRUL PEPTIDE ANTIBODY, IGG: Cyclic Citrullin Peptide Ab: <16 UNITS

## 2017-11-08 ENCOUNTER — Ambulatory Visit (INDEPENDENT_AMBULATORY_CARE_PROVIDER_SITE_OTHER): Payer: Medicare Other | Admitting: Cardiology

## 2017-11-08 ENCOUNTER — Encounter: Payer: Self-pay | Admitting: Cardiology

## 2017-11-08 VITALS — BP 126/80 | HR 90 | Ht 64.5 in | Wt 95.8 lb

## 2017-11-08 DIAGNOSIS — J449 Chronic obstructive pulmonary disease, unspecified: Secondary | ICD-10-CM | POA: Diagnosis not present

## 2017-11-08 DIAGNOSIS — J849 Interstitial pulmonary disease, unspecified: Secondary | ICD-10-CM

## 2017-11-08 DIAGNOSIS — I429 Cardiomyopathy, unspecified: Secondary | ICD-10-CM | POA: Diagnosis not present

## 2017-11-08 DIAGNOSIS — F17200 Nicotine dependence, unspecified, uncomplicated: Secondary | ICD-10-CM

## 2017-11-08 NOTE — Progress Notes (Signed)
Cardiology Office Note:    Date:  11/08/2017   ID:  Krystal Cantu, DOB 04-08-1961, MRN [id number]  PCP:  Raelyn Number, MD  Cardiologist:  Jenean Lindau, MD   Referring MD: No ref. provider found    ASSESSMENT:    1. Cardiomyopathy, unspecified type ([place])   2. COPD GOLD 0   3. ILD (interstitial lung disease) ([place])   4. TOBACCO ABUSE    PLAN:    In order of problems listed above:  1. Discussed my findings with the patient at extensive length.  Her cardiac condition is stable at a low level.  She has cardiomyopathy.  Because of her advanced frail condition and the fact that he went on auscultation her lungs appear to have some wheezing I would recommend that she undergo stress testing at this time.  Her general condition is poor and she is symptomatic at low level from pulmonary pathology.  Continue medical management is favored in terms of benefit risk ratio.  This was explained to him at length and she vocalized understanding.  I congratulated her that she is not smoking since hospital discharge.  She will have a Chem-7 today.  She is on diuretic therapy. 2. Patient will be seen in follow-up appointment in 6 months or earlier if the patient has any concerns    Medication Adjustments/Labs and Tests Ordered: Current medicines are reviewed at length with the patient today.  Concerns regarding medicines are outlined above.  Orders Placed This Encounter  Procedures  . Basic metabolic panel   No orders of the defined types were placed in this encounter.    Chief Complaint  Patient presents with  . Follow-up  . Chest Pain     History of Present Illness:    Krystal Cantu is a 57 y.o. female.  She is extremely frail.  She has advanced pulmonary condition including COPD.  She is here for follow-up.  She denies any problems at this time she takes care of activities of daily living.  She has shortness of breath on extension which is very chronic.  She uses oxygen on a daily  basis and comes in today with an oxygen cylinder which is helping her do her activities of daily she tells me that since hospital discharge she has not smoked a cigarette.  She is done with the patches 2.  History reviewed. No pertinent past medical history.  History reviewed. No pertinent surgical history.  Current Medications: Current Meds  Medication Sig  . ALPRAZolam (XANAX) 1 MG tablet Take 1 mg by mouth as needed.   Marland Kitchen atorvastatin (LIPITOR) 80 MG tablet Take 80 mg by mouth daily.  Marland Kitchen BREO ELLIPTA 100-25 MCG/INH AEPB INHALE 1 PUFF PO ONCE D  . Cholecalciferol (VITAMIN D-1000 MAX ST) 1000 units tablet Take by mouth.  . doxycycline (VIBRAMYCIN) 100 MG capsule   . esomeprazole (NEXIUM) 40 MG capsule Take 40 mg by mouth daily.   . feeding supplement (BOOST HIGH PROTEIN) LIQD Take 1 Container by mouth 2 (two) times daily between meals.  . fluticasone (FLONASE) 50 MCG/ACT nasal spray U 1 SPR IEN ONCE D  . furosemide (LASIX) 40 MG tablet Take 40 mg by mouth daily.  Marland Kitchen gabapentin (NEURONTIN) 300 MG capsule Take 300 mg by mouth 2 (two) times daily.   Marland Kitchen guaiFENesin (MUCINEX) 600 MG 12 hr tablet Take 1,200 mg by mouth 2 (two) times daily.  Marland Kitchen ipratropium-albuterol (DUONEB) 0.5-2.5 (3) MG/3ML SOLN Take 3 mLs by nebulization every 6 (  six) hours.  . OXYGEN 2lpm with rest and 3lpm with exertion  . PROAIR HFA 108 (90 Base) MCG/ACT inhaler Inhale 2 puffs into the lungs every 6 (six) hours as needed.   . venlafaxine XR (EFFEXOR-XR) 37.5 MG 24 hr capsule Take 37.5 mg by mouth daily with breakfast.   . [DISCONTINUED] lidocaine (LIDODERM) 5 % Place 1 patch onto the skin daily. Remove & Discard patch within 12 hours or as directed by MD  . [DISCONTINUED] nicotine (NICODERM CQ - DOSED IN MG/24 HOURS) 21 mg/24hr patch Place 21 mg onto the skin daily.  . [DISCONTINUED] prednisoLONE 5 MG TABS tablet Take by mouth as directed.     Allergies:   Ceftriaxone; Pregabalin; Codeine; Itraconazole; Pseudoephedrine; and  Sulfamethoxazole   Social History   Socioeconomic History  . Marital status: Divorced    Spouse name: None  . Number of children: None  . Years of education: None  . Highest education level: None  Social Needs  . Financial resource strain: None  . Food insecurity - worry: None  . Food insecurity - inability: None  . Transportation needs - medical: None  . Transportation needs - non-medical: None  Occupational History  . None  Tobacco Use  . Smoking status: Former Smoker    Packs/day: 2.00    Years: 36.00    Pack years: 72.00    Types: Cigarettes    Last attempt to quit: 09/24/2017    Years since quitting: 0.1  . Smokeless tobacco: Former Network engineer and Sexual Activity  . Alcohol use: None  . Drug use: None  . Sexual activity: None  Other Topics Concern  . None  Social History Narrative  . None     Family History: The patient's family history is not on file.  ROS:   Please see the history of present illness.    All other systems reviewed and are negative.  EKGs/Labs/Other Studies Reviewed:    The following studies were reviewed today: I discussed the findings of today's evaluation with the patient at length.   Recent Labs: 10/14/2017: BUN 33; Creatinine, Ser 0.91; Hemoglobin 11.3; Platelets 686.0; Potassium 4.3; Pro B Natriuretic peptide (BNP) 854.0; Sodium 137; TSH 0.56  Recent Lipid Panel No results found for: CHOL, TRIG, HDL, CHOLHDL, VLDL, LDLCALC, LDLDIRECT  Physical Exam:    VS:  BP 126/80 (BP Location: Left Arm, Patient Position: Sitting, Cuff Size: Normal)   Pulse 90   Ht 5' 4.5" (1.638 m)   Wt 95 lb 12.8 oz (43.5 kg)   SpO2 98%   BMI 16.19 kg/m     Wt Readings from Last 3 Encounters:  11/08/17 95 lb 12.8 oz (43.5 kg)  10/14/17 89 lb (40.4 kg)  10/11/17 86 lb 1.9 oz (39.1 kg)     GEN: Patient is in no acute distress HEENT: Normal NECK: No JVD; No carotid bruits LYMPHATICS: No lymphadenopathy CARDIAC: Hear sounds regular, 2/6 systolic  murmur at the apex. RESPIRATORY:  Clear to auscultation without rales, wheezing or rhonchi  ABDOMEN: Soft, non-tender, non-distended MUSCULOSKELETAL:  No edema; No deformity  SKIN: Warm and dry NEUROLOGIC:  Alert and oriented x 3 PSYCHIATRIC:  Normal affect   Signed, Jenean Lindau, MD  11/08/2017 2:06 PM    [place]

## 2017-11-08 NOTE — Patient Instructions (Signed)
Medication Instructions:  Your physician recommends that you continue on your current medications as directed. Please refer to the Current Medication list given to you today.  Labwork: Your physician recommends that you have the following labs drawn: BMP  Testing/Procedures: None  Follow-Up: Your physician recommends that you schedule a follow-up appointment in: 6 months  Any Other Special Instructions Will Be Listed Below (If Applicable).     If you need a refill on your cardiac medications before your next appointment, please call your pharmacy.   [facility]  Shaeleigh Graw A, RN, BSN  

## 2017-11-09 LAB — BASIC METABOLIC PANEL
BUN/Creatinine Ratio: 13 (ref 9–23)
BUN: 9 mg/dL (ref 6–24)
CALCIUM: 9.9 mg/dL (ref 8.7–10.2)
CO2: 31 mmol/L — ABNORMAL HIGH (ref 20–29)
Chloride: 94 mmol/L — ABNORMAL LOW (ref 96–106)
Creatinine, Ser: 0.67 mg/dL (ref 0.57–1.00)
GFR, EST AFRICAN AMERICAN: 113 mL/min/{1.73_m2} (ref >59)
GFR, EST NON AFRICAN AMERICAN: 98 mL/min/{1.73_m2} (ref >59)
Glucose: 106 mg/dL — ABNORMAL HIGH (ref 65–99)
POTASSIUM: 4.8 mmol/L (ref 3.5–5.2)
Sodium: 145 mmol/L — ABNORMAL HIGH (ref 134–144)

## 2017-11-26 ENCOUNTER — Ambulatory Visit (INDEPENDENT_AMBULATORY_CARE_PROVIDER_SITE_OTHER): Payer: Medicare Other | Admitting: Internal Medicine

## 2017-11-26 ENCOUNTER — Encounter: Payer: Self-pay | Admitting: Internal Medicine

## 2017-11-26 ENCOUNTER — Other Ambulatory Visit (INDEPENDENT_AMBULATORY_CARE_PROVIDER_SITE_OTHER): Payer: Medicare Other

## 2017-11-26 ENCOUNTER — Ambulatory Visit: Payer: Medicare Other | Admitting: Internal Medicine

## 2017-11-26 VITALS — BP 102/62 | HR 95 | Ht 64.5 in | Wt 98.2 lb

## 2017-11-26 DIAGNOSIS — J9612 Chronic respiratory failure with hypercapnia: Secondary | ICD-10-CM

## 2017-11-26 DIAGNOSIS — J849 Interstitial pulmonary disease, unspecified: Secondary | ICD-10-CM

## 2017-11-26 DIAGNOSIS — J9611 Chronic respiratory failure with hypoxia: Secondary | ICD-10-CM | POA: Diagnosis not present

## 2017-11-26 DIAGNOSIS — F17210 Nicotine dependence, cigarettes, uncomplicated: Secondary | ICD-10-CM

## 2017-11-26 DIAGNOSIS — R0609 Other forms of dyspnea: Secondary | ICD-10-CM | POA: Diagnosis not present

## 2017-11-26 DIAGNOSIS — J449 Chronic obstructive pulmonary disease, unspecified: Secondary | ICD-10-CM

## 2017-11-26 LAB — PULMONARY FUNCTION TEST
DL/VA % PRED: 84 %
DL/VA: 4.09 ml/min/mmHg/L
DLCO UNC % PRED: 28 %
DLCO UNC: 7.15 ml/min/mmHg
FEF 25-75 POST: 0.48 L/s
FEF 25-75 PRE: 0.65 L/s
FEF2575-%Change-Post: -25 %
FEF2575-%PRED-POST: 19 %
FEF2575-%PRED-PRE: 25 %
FEV1-%CHANGE-POST: -7 %
FEV1-%Pred-Post: 22 %
FEV1-%Pred-Pre: 24 %
FEV1-POST: 0.59 L
FEV1-Pre: 0.65 L
FEV1FVC-%Change-Post: 0 %
FEV1FVC-%PRED-PRE: 106 %
FEV6-%CHANGE-POST: -8 %
FEV6-%PRED-POST: 21 %
FEV6-%Pred-Pre: 23 %
FEV6-POST: 0.71 L
FEV6-Pre: 0.77 L
FEV6FVC-%Pred-Post: 103 %
FEV6FVC-%Pred-Pre: 103 %
FVC-%Change-Post: -7 %
FVC-%PRED-POST: 20 %
FVC-%Pred-Pre: 22 %
FVC-Post: 0.71 L
FVC-Pre: 0.77 L
POST FEV1/FVC RATIO: 83 %
PRE FEV1/FVC RATIO: 84 %
Post FEV6/FVC ratio: 100 %
Pre FEV6/FVC Ratio: 100 %
RV % PRED: 62 %
RV: 1.22 L
TLC % pred: 41 %
TLC: 2.11 L

## 2017-11-26 LAB — CBC WITH DIFFERENTIAL/PLATELET
Basophils Absolute: 0.1 10*3/uL (ref 0.0–0.1)
Basophils Relative: 0.6 % (ref 0.0–3.0)
Eosinophils Absolute: 0.4 10*3/uL (ref 0.0–0.7)
Eosinophils Relative: 2.9 % (ref 0.0–5.0)
HCT: 32.9 % — ABNORMAL LOW (ref 36.0–46.0)
Hemoglobin: 10.8 g/dL — ABNORMAL LOW (ref 12.0–15.0)
Lymphocytes Relative: 30.7 % (ref 12.0–46.0)
Lymphs Abs: 3.9 10*3/uL (ref 0.7–4.0)
MCHC: 32.8 g/dL (ref 30.0–36.0)
MCV: 98.6 fl (ref 78.0–100.0)
Monocytes Absolute: 1.4 10*3/uL — ABNORMAL HIGH (ref 0.1–1.0)
Monocytes Relative: 11 % (ref 3.0–12.0)
Neutro Abs: 7 10*3/uL (ref 1.4–7.7)
Neutrophils Relative %: 54.8 % (ref 43.0–77.0)
Platelets: 544 10*3/uL — ABNORMAL HIGH (ref 150.0–400.0)
RBC: 3.34 Mil/uL — ABNORMAL LOW (ref 3.87–5.11)
RDW: 17.9 % — ABNORMAL HIGH (ref 11.5–15.5)
WBC: 12.8 10*3/uL — ABNORMAL HIGH (ref 4.0–10.5)

## 2017-11-26 LAB — SEDIMENTATION RATE: Sed Rate: 111 mm/hr — ABNORMAL HIGH (ref 0–30)

## 2017-11-26 MED ORDER — FLUTICASONE-UMECLIDIN-VILANT 100-62.5-25 MCG/INH IN AEPB: 1.0000 | INHALATION_SPRAY | Freq: Every day | RESPIRATORY_TRACT | 0 refills | Status: DC

## 2017-11-26 MED ORDER — FLUTICASONE-UMECLIDIN-VILANT 100-62.5-25 MCG/INH IN AEPB: 1.0000 | INHALATION_SPRAY | Freq: Every day | RESPIRATORY_TRACT | 11 refills | Status: AC

## 2017-11-26 NOTE — Progress Notes (Signed)
PFT completed 11/26/17

## 2017-11-26 NOTE — Progress Notes (Signed)
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 prev followed by [facility] in [place]  on pred typically about a month and then good in between for up to she says as long several years but has become 02 dep 2015 and maint BREO / proair in between courses of prednisone since around 2015 so referred to pulmonary clinic 10/14/2017 by Dr   Merita Norton at [facility] p admit  09/26/17 - 10/03/17 [facility]  1) acute on chronic resp failure pna/aecopd/boop/ chf all listed as possible components  - sputum rew hf flu  - DNR status confirmed per d/c summary  2) CHF with elevated bnp and troponin - EF 35%  -  Cards turned down for cath ([place] )     History of Present Illness  10/14/2017 1st [facility] office visit/ Krystal Cantu  / presently on pred 20 mg daily  Chief Complaint  Patient presents with  . Pulmonary Consult    Referred by Dr. Merita Norton at [place].  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    at best still on 2lpm to 3 lpm and can walk up to 10 min at slow pace / on BREO and rarely needing saba Typical flare with sinus draining sore throat  Props up some <30 degrees on 2lpm  Doe x across the room leaning on walker  rec Nexium Take 30-60 min before first meal of the day and Pepcid 20 mg one at bedtime    11/26/2017  f/u ov/Krystal Cantu re: severe restrictive dz / probable boop / breo maint Chief Complaint  Patient presents with  . Follow-up    Breathing has improved back to her normal baseline. She is using her proair inhaler 4-5 x per day on average and neb 3 x daily.    Dyspnea:  Able to shop at [place] on 2lpm = MMRC3 = can't walk 100 yards even at a slow pace at a flat grade s stopping due to sob  Cough: some rattling esp at hs but no purulent sputum Sleep: 2pillows/ 2lpm Now off prednisone x sev weeks and not worse but way  over using saba now    No obvious day to day or daytime variability or assoc excess/ purulent sputum or mucus plugs or hemoptysis or cp or chest tightness, subjective wheeze or overt sinus or hb symptoms. No unusual exposure hx or h/o childhood pna/ asthma or knowledge of premature birth.  Sleeping ok 2pillows/ 2 lpm without nocturnal  or early am exacerbation  of respiratory  c/o's or need for noct saba. Also denies any obvious fluctuation of symptoms with weather or environmental changes or other aggravating or alleviating factors except as outlined above   Current Allergies, Complete Past Medical History, Past Surgical History, Family History, and Social History were reviewed in [employer] record.  ROS  The following are not active complaints unless bolded Hoarseness, sore throat, dysphagia, dental problems, itching, sneezing,  nasal congestion or discharge of excess mucus or purulent secretions, ear ache,   fever, chills, sweats, unintended wt loss or wt gain, classically pleuritic or exertional cp,  orthopnea pnd or leg swelling, presyncope, palpitations, abdominal pain, anorexia, nausea, vomiting, diarrhea  or change in bowel habits or change in bladder habits, change in  stools or change in urine, dysuria, hematuria,  rash, arthralgias, visual complaints, headache, numbness, weakness or ataxia or problems with walking or coordination,  change in mood/affect or memory.        Current Meds  Medication Sig  . ALPRAZolam (XANAX) 1 MG tablet Take 1 mg by mouth as needed.   Marland Kitchen atorvastatin (LIPITOR) 80 MG tablet Take 80 mg by mouth daily.  . Cholecalciferol (VITAMIN D-1000 MAX ST) 1000 units tablet Take by mouth.  . esomeprazole (NEXIUM) 40 MG capsule Take 40 mg by mouth daily.   . feeding supplement (BOOST HIGH PROTEIN) LIQD Take 1 Container by mouth 2 (two) times daily between meals.  . fluticasone (FLONASE) 50 MCG/ACT nasal spray U 1 SPR IEN ONCE D  . furosemide (LASIX)  40 MG tablet Take 40 mg by mouth daily.  Marland Kitchen gabapentin (NEURONTIN) 300 MG capsule Take 300 mg by mouth 2 (two) times daily.   Marland Kitchen ipratropium-albuterol (DUONEB) 0.5-2.5 (3) MG/3ML SOLN Take 3 mLs by nebulization every 6 (six) hours.  . OXYGEN 2lpm with rest and 3lpm with exertion  . PROAIR HFA 108 (90 Base) MCG/ACT inhaler Inhale 2 puffs into the lungs every 6 (six) hours as needed.   . venlafaxine XR (EFFEXOR-XR) 37.5 MG 24 hr capsule Take 37.5 mg by mouth daily with breakfast.   . [  BREO ELLIPTA 100-25 MCG/INH AEPB INHALE 1 PUFF PO ONCE D                 Objective:   Physical Exam       Thin now amb wf nad    11/26/2017         98   10/14/17 89 lb (40.4 kg)  10/11/17 86 lb 1.9 oz (39.1 kg)     Vital signs reviewed - Note on arrival 02 sats  92% on 2lpm  cont       slt barrel contour chest with coarse pops/ squeaks on insp bilaterally / exp rhoonchi   HEENT: nl   turbinates bilaterally, and oropharynx.     NECK :  without JVD/Nodes/TM/ nl carotid upstrokes bilaterally   LUNGS: no acc muscle use,  Nl contour chest with insp coarse pops/squeaks bilaterally/ also exp rhonchi    CV:  RRR  no s3 or murmur or increase in P2, and no edema   ABD:  soft and nontender with nl inspiratory excursion in the supine position. No bruits or organomegaly appreciated, bowel sounds nl  MS:  Nl gait/ ext warm without deformities, calf tenderness, cyanosis or clubbing No obvious joint restrictions   SKIN: warm and dry without lesions    NEURO:  alert, approp, nl sensorium with  no motor or cerebellar deficits apparent.          Labs ordered 11/26/2017  [facility]      Lab Results  Component Value Date   ESRSEDRATE 111 (H) 11/26/2017   ESRSEDRATE 37 (H) 10/14/2017   ESRSEDRATE 59 (H) 09/26/2009                    Assessment:

## 2017-11-26 NOTE — Patient Instructions (Signed)
Add incruse one click each am until you finish the Breo/Incruse combo switch to Trelegy one click am (=Breo plus Incruse)   Please remember to go to the lab department downstairs in the basement  for your tests - we will call you with the results when they are available.     Although I don't endorse regular use of e cigs/ many pts find them helpful; however, I emphasized they should be considered a "one-way bridge" off all tobacco products.        The key is to stop smoking completely before smoking completely stops you!    Please schedule a follow up office visit in 6 weeks, call sooner if needed

## 2017-11-27 ENCOUNTER — Encounter: Payer: Self-pay | Admitting: Internal Medicine

## 2017-11-27 NOTE — Assessment & Plan Note (Signed)
>   3 min Discussed the risks and costs (both direct and indirect)  of smoking relative to the benefits of quitting but patient unwilling to commit at this point to a specific quit date.    Although I don't endorse regular use of e cigs/ many pts find them helpful; however, I emphasized they should be considered a "one-way bridge" off all tobacco products.  

## 2017-11-27 NOTE — Assessment & Plan Note (Addendum)
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

## 2017-11-27 NOTE — Assessment & Plan Note (Signed)
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  In this case Adherence is the biggest issue and starts with  inability to use HFA effectively and also  understand that SABA treats the symptoms but doesn't get to the underlying problem (inflammation).  I used  the analogy of putting steroid cream on a rash to help explain the meaning of topical therapy and the need to get the drug to the target tissue.    Formulary restrictions will be an ongoing challenge for the forseable future and I would be happy to pick an alternative if the pt will first  provide me a list of them but pt  will need to return here for training for any new device that is required eg dpi vs hfa vs respimat.    In meantime we can always provide samples so the patient never runs out of any needed respiratory medications.

## 2017-11-27 NOTE — Assessment & Plan Note (Signed)
HC03  10/14/2017  =  36    Adequate control on present rx, reviewed in detail with pt > no change in rx needed  = 2 lpm 24/7

## 2017-11-29 ENCOUNTER — Telehealth: Payer: Self-pay | Admitting: Internal Medicine

## 2017-11-29 NOTE — Telephone Encounter (Signed)
Attempted to call pt but no answer.   Left message for pt to return our call x1 

## 2017-11-29 NOTE — Progress Notes (Signed)
LMTCB

## 2017-11-29 NOTE — Telephone Encounter (Signed)
Tanda Rockers, MD sent to Rosana Berger, [place]        Call patient : Studies are strongly suggestive of recurrent boop - if any worsening of sob/ cough would rec restart pred at 20 mg daily x 4 weeks then ov or repeat a HRCT asap with ov to follow 2 days later    Spoke with pt and notified of results per Dr. Melvyn Novas. Pt verbalized understanding and denied any questions. She states that she is feeling much better  Will keep f/u as planned

## 2017-11-29 NOTE — Telephone Encounter (Signed)
Pt is calling back  [phone]

## 2017-11-30 ENCOUNTER — Encounter: Payer: Self-pay | Admitting: Internal Medicine

## 2017-12-01 NOTE — Telephone Encounter (Signed)
Dr. Melvyn Novas - please advise on PFT results as the pt does not remember what you told her at her [facility]. Thanks.

## 2017-12-03 ENCOUNTER — Telehealth: Payer: Self-pay | Admitting: Internal Medicine

## 2017-12-03 LAB — ALPHA-1 ANTITRYPSIN PHENOTYPE: A-1 Antitrypsin, Ser: 196 mg/dL (ref 83–199)

## 2017-12-03 NOTE — Telephone Encounter (Signed)
Notes recorded by Rosana Berger, CMA on 12/03/2017 at 1:35 PM EDT [facility] ------  Notes recorded by Tanda Rockers, MD on 12/03/2017 at 11:25 AM EDT Call patient : Study is unremarkable, no change in recs  Spoke with patient. She is aware of results. Nothing else needed at time of call.

## 2017-12-03 NOTE — Progress Notes (Signed)
LMTCB

## 2017-12-07 ENCOUNTER — Encounter: Payer: Self-pay | Admitting: Internal Medicine

## 2018-01-07 ENCOUNTER — Ambulatory Visit: Payer: Medicare Other | Admitting: Internal Medicine

## 2018-08-29 DIAGNOSIS — R569 Unspecified convulsions: Secondary | ICD-10-CM | POA: Diagnosis not present

## 2018-08-29 DIAGNOSIS — E7849 Other hyperlipidemia: Secondary | ICD-10-CM

## 2018-08-29 DIAGNOSIS — J8489 Other specified interstitial pulmonary diseases: Secondary | ICD-10-CM

## 2018-08-29 DIAGNOSIS — K219 Gastro-esophageal reflux disease without esophagitis: Secondary | ICD-10-CM

## 2018-08-30 DIAGNOSIS — E7849 Other hyperlipidemia: Secondary | ICD-10-CM | POA: Diagnosis not present

## 2018-08-30 DIAGNOSIS — K219 Gastro-esophageal reflux disease without esophagitis: Secondary | ICD-10-CM | POA: Diagnosis not present

## 2018-08-30 DIAGNOSIS — R569 Unspecified convulsions: Secondary | ICD-10-CM | POA: Diagnosis not present

## 2018-08-30 DIAGNOSIS — J8489 Other specified interstitial pulmonary diseases: Secondary | ICD-10-CM | POA: Diagnosis not present

## 2018-10-20 ENCOUNTER — Ambulatory Visit: Payer: Medicare Other | Admitting: Neurology

## 2018-11-23 ENCOUNTER — Ambulatory Visit: Payer: Medicare Other | Admitting: Neurology

## 2019-02-12 DIAGNOSIS — J9601 Acute respiratory failure with hypoxia: Secondary | ICD-10-CM | POA: Diagnosis not present

## 2019-02-12 DIAGNOSIS — J8489 Other specified interstitial pulmonary diseases: Secondary | ICD-10-CM

## 2019-02-12 DIAGNOSIS — J209 Acute bronchitis, unspecified: Secondary | ICD-10-CM | POA: Diagnosis not present

## 2019-02-12 DIAGNOSIS — J441 Chronic obstructive pulmonary disease with (acute) exacerbation: Secondary | ICD-10-CM | POA: Diagnosis not present

## 2019-02-12 DIAGNOSIS — K219 Gastro-esophageal reflux disease without esophagitis: Secondary | ICD-10-CM

## 2019-02-12 DIAGNOSIS — F319 Bipolar disorder, unspecified: Secondary | ICD-10-CM | POA: Diagnosis not present

## 2019-02-12 DIAGNOSIS — E7849 Other hyperlipidemia: Secondary | ICD-10-CM

## 2019-02-13 DIAGNOSIS — J209 Acute bronchitis, unspecified: Secondary | ICD-10-CM | POA: Diagnosis not present

## 2019-02-13 DIAGNOSIS — J441 Chronic obstructive pulmonary disease with (acute) exacerbation: Secondary | ICD-10-CM | POA: Diagnosis not present

## 2019-02-13 DIAGNOSIS — F319 Bipolar disorder, unspecified: Secondary | ICD-10-CM | POA: Diagnosis not present

## 2019-02-13 DIAGNOSIS — J9601 Acute respiratory failure with hypoxia: Secondary | ICD-10-CM | POA: Diagnosis not present

## 2019-02-14 DIAGNOSIS — J441 Chronic obstructive pulmonary disease with (acute) exacerbation: Secondary | ICD-10-CM | POA: Diagnosis not present

## 2019-02-14 DIAGNOSIS — J9601 Acute respiratory failure with hypoxia: Secondary | ICD-10-CM | POA: Diagnosis not present

## 2019-02-14 DIAGNOSIS — F319 Bipolar disorder, unspecified: Secondary | ICD-10-CM | POA: Diagnosis not present

## 2019-02-14 DIAGNOSIS — J209 Acute bronchitis, unspecified: Secondary | ICD-10-CM | POA: Diagnosis not present

## 2019-02-15 DIAGNOSIS — F319 Bipolar disorder, unspecified: Secondary | ICD-10-CM | POA: Diagnosis not present

## 2019-02-15 DIAGNOSIS — J9601 Acute respiratory failure with hypoxia: Secondary | ICD-10-CM | POA: Diagnosis not present

## 2019-02-15 DIAGNOSIS — J209 Acute bronchitis, unspecified: Secondary | ICD-10-CM | POA: Diagnosis not present

## 2019-02-15 DIAGNOSIS — J441 Chronic obstructive pulmonary disease with (acute) exacerbation: Secondary | ICD-10-CM | POA: Diagnosis not present

## 2019-02-16 DIAGNOSIS — J9601 Acute respiratory failure with hypoxia: Secondary | ICD-10-CM | POA: Diagnosis not present

## 2019-02-16 DIAGNOSIS — J441 Chronic obstructive pulmonary disease with (acute) exacerbation: Secondary | ICD-10-CM | POA: Diagnosis not present

## 2019-02-16 DIAGNOSIS — F319 Bipolar disorder, unspecified: Secondary | ICD-10-CM | POA: Diagnosis not present

## 2019-02-16 DIAGNOSIS — J209 Acute bronchitis, unspecified: Secondary | ICD-10-CM | POA: Diagnosis not present

## 2019-02-17 DIAGNOSIS — J441 Chronic obstructive pulmonary disease with (acute) exacerbation: Secondary | ICD-10-CM | POA: Diagnosis not present

## 2019-02-17 DIAGNOSIS — J209 Acute bronchitis, unspecified: Secondary | ICD-10-CM | POA: Diagnosis not present

## 2019-02-17 DIAGNOSIS — J9601 Acute respiratory failure with hypoxia: Secondary | ICD-10-CM | POA: Diagnosis not present

## 2019-02-17 DIAGNOSIS — F319 Bipolar disorder, unspecified: Secondary | ICD-10-CM | POA: Diagnosis not present

## 2019-03-23 ENCOUNTER — Inpatient Hospital Stay (HOSPITAL_COMMUNITY)
Admission: AD | Admit: 2019-03-23 | Discharge: 2019-03-30 | DRG: 208 | Disposition: A | Discharge status: Skilled Nursing Facility | Payer: Medicare Other | Source: Other Acute Inpatient Hospital | Attending: Internal Medicine | Admitting: Internal Medicine

## 2019-03-23 ENCOUNTER — Inpatient Hospital Stay (HOSPITAL_COMMUNITY): Payer: Medicare Other

## 2019-03-23 DIAGNOSIS — E873 Alkalosis: Secondary | ICD-10-CM | POA: Diagnosis present

## 2019-03-23 DIAGNOSIS — K5900 Constipation, unspecified: Secondary | ICD-10-CM | POA: Diagnosis not present

## 2019-03-23 DIAGNOSIS — Z888 Allergy status to other drugs, medicaments and biological substances status: Secondary | ICD-10-CM | POA: Diagnosis not present

## 2019-03-23 DIAGNOSIS — Z882 Allergy status to sulfonamides status: Secondary | ICD-10-CM | POA: Diagnosis not present

## 2019-03-23 DIAGNOSIS — J849 Interstitial pulmonary disease, unspecified: Secondary | ICD-10-CM | POA: Diagnosis present

## 2019-03-23 DIAGNOSIS — Z9981 Dependence on supplemental oxygen: Secondary | ICD-10-CM | POA: Diagnosis not present

## 2019-03-23 DIAGNOSIS — Z79899 Other long term (current) drug therapy: Secondary | ICD-10-CM | POA: Diagnosis not present

## 2019-03-23 DIAGNOSIS — G8929 Other chronic pain: Secondary | ICD-10-CM | POA: Diagnosis present

## 2019-03-23 DIAGNOSIS — F419 Anxiety disorder, unspecified: Secondary | ICD-10-CM | POA: Diagnosis present

## 2019-03-23 DIAGNOSIS — F17210 Nicotine dependence, cigarettes, uncomplicated: Secondary | ICD-10-CM | POA: Diagnosis present

## 2019-03-23 DIAGNOSIS — F329 Major depressive disorder, single episode, unspecified: Secondary | ICD-10-CM | POA: Diagnosis present

## 2019-03-23 DIAGNOSIS — I272 Pulmonary hypertension, unspecified: Secondary | ICD-10-CM | POA: Diagnosis present

## 2019-03-23 DIAGNOSIS — Z1159 Encounter for screening for other viral diseases: Secondary | ICD-10-CM

## 2019-03-23 DIAGNOSIS — T4275XA Adverse effect of unspecified antiepileptic and sedative-hypnotic drugs, initial encounter: Secondary | ICD-10-CM | POA: Diagnosis present

## 2019-03-23 DIAGNOSIS — Z681 Body mass index (BMI) 19 or less, adult: Secondary | ICD-10-CM

## 2019-03-23 DIAGNOSIS — J441 Chronic obstructive pulmonary disease with (acute) exacerbation: Secondary | ICD-10-CM | POA: Diagnosis present

## 2019-03-23 DIAGNOSIS — I5042 Chronic combined systolic (congestive) and diastolic (congestive) heart failure: Secondary | ICD-10-CM | POA: Diagnosis present

## 2019-03-23 DIAGNOSIS — G9341 Metabolic encephalopathy: Secondary | ICD-10-CM | POA: Diagnosis present

## 2019-03-23 DIAGNOSIS — Z7951 Long term (current) use of inhaled steroids: Secondary | ICD-10-CM | POA: Diagnosis not present

## 2019-03-23 DIAGNOSIS — J9622 Acute and chronic respiratory failure with hypercapnia: Principal | ICD-10-CM | POA: Diagnosis present

## 2019-03-23 DIAGNOSIS — I952 Hypotension due to drugs: Secondary | ICD-10-CM | POA: Diagnosis present

## 2019-03-23 DIAGNOSIS — Z885 Allergy status to narcotic agent status: Secondary | ICD-10-CM | POA: Diagnosis not present

## 2019-03-23 DIAGNOSIS — R5381 Other malaise: Secondary | ICD-10-CM | POA: Diagnosis present

## 2019-03-23 DIAGNOSIS — I351 Nonrheumatic aortic (valve) insufficiency: Secondary | ICD-10-CM | POA: Diagnosis not present

## 2019-03-23 DIAGNOSIS — J90 Pleural effusion, not elsewhere classified: Secondary | ICD-10-CM

## 2019-03-23 DIAGNOSIS — D539 Nutritional anemia, unspecified: Secondary | ICD-10-CM | POA: Diagnosis present

## 2019-03-23 DIAGNOSIS — R34 Anuria and oliguria: Secondary | ICD-10-CM | POA: Diagnosis present

## 2019-03-23 DIAGNOSIS — J9621 Acute and chronic respiratory failure with hypoxia: Secondary | ICD-10-CM | POA: Diagnosis present

## 2019-03-23 DIAGNOSIS — I340 Nonrheumatic mitral (valve) insufficiency: Secondary | ICD-10-CM | POA: Diagnosis not present

## 2019-03-23 DIAGNOSIS — E875 Hyperkalemia: Secondary | ICD-10-CM | POA: Diagnosis present

## 2019-03-23 DIAGNOSIS — R64 Cachexia: Secondary | ICD-10-CM | POA: Diagnosis present

## 2019-03-23 DIAGNOSIS — G9340 Encephalopathy, unspecified: Secondary | ICD-10-CM | POA: Diagnosis not present

## 2019-03-23 LAB — URINALYSIS, ROUTINE W REFLEX MICROSCOPIC
Bilirubin Urine: NEGATIVE
Glucose, UA: NEGATIVE mg/dL
Hgb urine dipstick: NEGATIVE
Ketones, ur: NEGATIVE mg/dL
Leukocytes,Ua: NEGATIVE
Nitrite: NEGATIVE
Protein, ur: 30 mg/dL — AB
Specific Gravity, Urine: 1.017 (ref 1.005–1.030)
pH: 5 (ref 5.0–8.0)

## 2019-03-23 LAB — POCT I-STAT 7, (LYTES, BLD GAS, ICA,H+H)
Acid-Base Excess: 11 mmol/L — ABNORMAL HIGH (ref 0.0–2.0)
Bicarbonate: 36.2 mmol/L — ABNORMAL HIGH (ref 20.0–28.0)
Calcium, Ion: 1.17 mmol/L (ref 1.15–1.40)
HCT: 25 % — ABNORMAL LOW (ref 36.0–46.0)
Hemoglobin: 8.5 g/dL — ABNORMAL LOW (ref 12.0–15.0)
O2 Saturation: 86 %
Potassium: 4.2 mmol/L (ref 3.5–5.1)
Sodium: 136 mmol/L (ref 135–145)
TCO2: 38 mmol/L — ABNORMAL HIGH (ref 22–32)
pCO2 arterial: 51 mmHg — ABNORMAL HIGH (ref 32.0–48.0)
pH, Arterial: 7.459 — ABNORMAL HIGH (ref 7.350–7.450)
pO2, Arterial: 50 mmHg — ABNORMAL LOW (ref 83.0–108.0)

## 2019-03-23 LAB — BASIC METABOLIC PANEL
Anion gap: 8 (ref 5–15)
Anion gap: 11 (ref 5–15)
BUN: 23 mg/dL — ABNORMAL HIGH (ref 6–20)
BUN: 22 mg/dL — ABNORMAL HIGH (ref 6–20)
CO2: 37 mmol/L — ABNORMAL HIGH (ref 22–32)
CO2: 31 mmol/L (ref 22–32)
Calcium: 8.7 mg/dL — ABNORMAL LOW (ref 8.9–10.3)
Calcium: 8.2 mg/dL — ABNORMAL LOW (ref 8.9–10.3)
Chloride: 94 mmol/L — ABNORMAL LOW (ref 98–111)
Chloride: 94 mmol/L — ABNORMAL LOW (ref 98–111)
Creatinine, Ser: 0.88 mg/dL (ref 0.44–1.00)
Creatinine, Ser: 0.8 mg/dL (ref 0.44–1.00)
GFR calc Af Amer: >60 mL/min (ref >60)
GFR calc Af Amer: >60 mL/min (ref >60)
GFR calc non Af Amer: >60 mL/min (ref >60)
GFR calc non Af Amer: >60 mL/min (ref >60)
Glucose, Bld: 118 mg/dL — ABNORMAL HIGH (ref 70–99)
Glucose, Bld: 106 mg/dL — ABNORMAL HIGH (ref 70–99)
Potassium: 5.3 mmol/L — ABNORMAL HIGH (ref 3.5–5.1)
Potassium: 4.1 mmol/L (ref 3.5–5.1)
Sodium: 139 mmol/L (ref 135–145)
Sodium: 136 mmol/L (ref 135–145)

## 2019-03-23 LAB — CBC
HCT: 26.5 % — ABNORMAL LOW (ref 36.0–46.0)
Hemoglobin: 8.1 g/dL — ABNORMAL LOW (ref 12.0–15.0)
MCH: 31 pg (ref 26.0–34.0)
MCHC: 30.6 g/dL (ref 30.0–36.0)
MCV: 101.5 fL — ABNORMAL HIGH (ref 80.0–100.0)
Platelets: 428 10*3/uL — ABNORMAL HIGH (ref 150–400)
RBC: 2.61 MIL/uL — ABNORMAL LOW (ref 3.87–5.11)
RDW: 16.8 % — ABNORMAL HIGH (ref 11.5–15.5)
WBC: 8.6 10*3/uL (ref 4.0–10.5)
nRBC: 0 % (ref 0.0–0.2)

## 2019-03-23 LAB — HEMOGLOBIN A1C
Hgb A1c MFr Bld: 5.6 % (ref 4.8–5.6)
Mean Plasma Glucose: 114.02 mg/dL

## 2019-03-23 LAB — GLUCOSE, CAPILLARY
Glucose-Capillary: 110 mg/dL — ABNORMAL HIGH (ref 70–99)
Glucose-Capillary: 108 mg/dL — ABNORMAL HIGH (ref 70–99)

## 2019-03-23 LAB — BRAIN NATRIURETIC PEPTIDE: B Natriuretic Peptide: 431.3 pg/mL — ABNORMAL HIGH (ref 0.0–100.0)

## 2019-03-23 LAB — PROCALCITONIN: Procalcitonin: <0.1 ng/mL

## 2019-03-23 LAB — MRSA PCR SCREENING: MRSA by PCR: NEGATIVE

## 2019-03-23 LAB — LACTIC ACID, PLASMA
Lactic Acid, Venous: 3.2 mmol/L (ref 0.5–1.9)
Lactic Acid, Venous: 1.5 mmol/L (ref 0.5–1.9)

## 2019-03-23 LAB — MAGNESIUM: Magnesium: 1.9 mg/dL (ref 1.7–2.4)

## 2019-03-23 LAB — STREP PNEUMONIAE URINARY ANTIGEN: Strep Pneumo Urinary Antigen: NEGATIVE

## 2019-03-23 LAB — PHOSPHORUS: Phosphorus: 4.2 mg/dL (ref 2.5–4.6)

## 2019-03-23 MED ORDER — NOREPINEPHRINE 4 MG/250ML-% IV SOLN
0.0000 ug/min | INTRAVENOUS | Status: DC
  Administered 2019-03-23: 4 ug/min via INTRAVENOUS
  Filled 2019-03-23: qty 250

## 2019-03-23 MED ORDER — FENTANYL CITRATE (PF) 100 MCG/2ML IJ SOLN
25.0000 ug | INTRAMUSCULAR | Status: DC | PRN
  Administered 2019-03-23 – 2019-03-24 (×5): 100 ug via INTRAVENOUS
  Administered 2019-03-24: 50 ug via INTRAVENOUS
  Administered 2019-03-24: 100 ug via INTRAVENOUS
  Filled 2019-03-23 (×7): qty 2

## 2019-03-23 MED ORDER — METHYLPREDNISOLONE SODIUM SUCC 125 MG IJ SOLR
60.0000 mg | Freq: Four times a day (QID) | INTRAMUSCULAR | Status: DC
  Administered 2019-03-23 – 2019-03-24 (×3): 60 mg via INTRAVENOUS
  Filled 2019-03-23 (×3): qty 2

## 2019-03-23 MED ORDER — SODIUM CHLORIDE 0.9 % IV SOLN
100.0000 mg | Freq: Two times a day (BID) | INTRAVENOUS | Status: DC
  Administered 2019-03-23 – 2019-03-24 (×2): 100 mg via INTRAVENOUS
  Filled 2019-03-23 (×3): qty 100

## 2019-03-23 MED ORDER — SODIUM CHLORIDE 0.9 % IV SOLN
INTRAVENOUS | Status: DC | PRN
  Administered 2019-03-23: 20:00:00 250 mL via INTRAVENOUS

## 2019-03-23 MED ORDER — ORAL CARE MOUTH RINSE
15.0000 mL | OROMUCOSAL | Status: DC
  Administered 2019-03-23 – 2019-03-24 (×6): 15 mL via OROMUCOSAL

## 2019-03-23 MED ORDER — SODIUM CHLORIDE 0.9% FLUSH
10.0000 mL | Freq: Two times a day (BID) | INTRAVENOUS | Status: DC
  Administered 2019-03-23 – 2019-03-24 (×3): 10 mL
  Administered 2019-03-25: 3 mL
  Administered 2019-03-25 – 2019-03-26 (×3): 10 mL

## 2019-03-23 MED ORDER — CHLORHEXIDINE GLUCONATE CLOTH 2 % EX PADS
6.0000 | MEDICATED_PAD | Freq: Every day | CUTANEOUS | Status: DC
  Administered 2019-03-24 – 2019-03-29 (×3): 6 via TOPICAL

## 2019-03-23 MED ORDER — ACETAMINOPHEN 325 MG PO TABS
650.0000 mg | ORAL_TABLET | ORAL | Status: DC | PRN
  Administered 2019-03-25 (×2): 650 mg via NASOGASTRIC
  Filled 2019-03-23 (×2): qty 2

## 2019-03-23 MED ORDER — IPRATROPIUM-ALBUTEROL 0.5-2.5 (3) MG/3ML IN SOLN
3.0000 mL | RESPIRATORY_TRACT | Status: DC
  Administered 2019-03-23 – 2019-03-25 (×11): 3 mL via RESPIRATORY_TRACT
  Filled 2019-03-23 (×11): qty 3

## 2019-03-23 MED ORDER — ONDANSETRON HCL 4 MG/2ML IJ SOLN
4.0000 mg | Freq: Four times a day (QID) | INTRAMUSCULAR | Status: DC | PRN
  Administered 2019-03-25 (×2): 4 mg via INTRAVENOUS
  Filled 2019-03-23 (×2): qty 2

## 2019-03-23 MED ORDER — IPRATROPIUM-ALBUTEROL 0.5-2.5 (3) MG/3ML IN SOLN: 3.0000 mL | Freq: Four times a day (QID) | RESPIRATORY_TRACT | Status: DC

## 2019-03-23 MED ORDER — FENTANYL CITRATE (PF) 100 MCG/2ML IJ SOLN: 25.0000 ug | INTRAMUSCULAR | Status: DC | PRN

## 2019-03-23 MED ORDER — CHLORHEXIDINE GLUCONATE 0.12% ORAL RINSE (MEDLINE KIT)
15.0000 mL | Freq: Two times a day (BID) | OROMUCOSAL | Status: DC
  Administered 2019-03-23 – 2019-03-24 (×2): 15 mL via OROMUCOSAL

## 2019-03-23 MED ORDER — SODIUM CHLORIDE 0.9% FLUSH: 10.0000 mL | INTRAVENOUS | Status: DC | PRN

## 2019-03-23 MED ORDER — INSULIN ASPART 100 UNIT/ML ~~LOC~~ SOLN
0.0000 [IU] | SUBCUTANEOUS | Status: DC
  Administered 2019-03-24 (×3): 2 [IU] via SUBCUTANEOUS
  Administered 2019-03-25: 5 [IU] via SUBCUTANEOUS
  Administered 2019-03-25: 2 [IU] via SUBCUTANEOUS

## 2019-03-23 MED ORDER — LACTATED RINGERS IV BOLUS
1000.0000 mL | Freq: Once | INTRAVENOUS | Status: AC
  Administered 2019-03-23: 1000 mL via INTRAVENOUS

## 2019-03-23 MED ORDER — IPRATROPIUM-ALBUTEROL 0.5-2.5 (3) MG/3ML IN SOLN
3.0000 mL | Freq: Four times a day (QID) | RESPIRATORY_TRACT | Status: DC
  Filled 2019-03-23: qty 3

## 2019-03-23 MED ORDER — PANTOPRAZOLE SODIUM 40 MG IV SOLR
40.0000 mg | Freq: Every day | INTRAVENOUS | Status: DC
  Administered 2019-03-23: 40 mg via INTRAVENOUS
  Filled 2019-03-23: qty 40

## 2019-03-23 MED ORDER — DEXMEDETOMIDINE HCL IN NACL 400 MCG/100ML IV SOLN
0.0000 ug/kg/h | INTRAVENOUS | Status: DC
  Administered 2019-03-23: 0.4 ug/kg/h via INTRAVENOUS
  Administered 2019-03-24: 1.2 ug/kg/h via INTRAVENOUS
  Filled 2019-03-23 (×2): qty 100

## 2019-03-23 MED ORDER — HEPARIN SODIUM (PORCINE) 5000 UNIT/ML IJ SOLN
5000.0000 [IU] | Freq: Three times a day (TID) | INTRAMUSCULAR | Status: DC
  Administered 2019-03-23 – 2019-03-30 (×18): 5000 [IU] via SUBCUTANEOUS
  Filled 2019-03-23 (×19): qty 1

## 2019-03-23 MED ORDER — SODIUM CHLORIDE 0.9 % IV SOLN
500.0000 mg | INTRAVENOUS | Status: DC
  Administered 2019-03-23: 500 mg via INTRAVENOUS
  Filled 2019-03-23 (×2): qty 500

## 2019-03-23 NOTE — H&P (Signed)
NAME:  Krystal Cantu, MRN:  [id number], DOB:  08/30/1960, LOS: 0 ADMISSION DATE:  03/23/2019, CONSULTATION DATE:  03/23/19 [facility] MD:  [name], CHIEF COMPLAINT:  Acute resp failure  Brief History   58 yo female admitted for acute resp failure req mechanical ventilation  History of present illness   58 yo female with pmh COPD, h/o BOOP vs ILD, Chronic hypoxic resp failure on 2-3 L at home presented to OSH ([facility]) for sob. Limited history is available and pt is intubated and sedated at this time. She was reportedly noted by her daughter to be unresponsive this afternoon with drool coming from mouth.   Ems was called and pt was found to be hypoxic. Upon arrival pt was found to be hypercarbic with cos in 90's pH 7.2. she was attempted on bipap without improvement and subsequently intubated. In looking thru the chart there was some note of hypotension req norepi and perhaps a code stroke but no records of that either.   She arrives to our ICU awake and moving all 4 but not to command. She was quickly weaned to 40% fio2 on vent.  Sedation will be weaned down for sat and potential extubation today.   Past Medical History  No past medical history on file.  Significant Hospital Events   7/30: transferred to [facility]  Consults:    Procedures:  7/30 ett-> 7/30 fem cvc->  Significant Diagnostic Tests:    Micro Data:  7/30 sars2 (from OSH): negative 7/30 blood 7/30 urine 7/30 resp 7/30 rvp 7/30 strep/leg:   Antimicrobials:  7/30 azithro 7/30 doxy  Interim history/subjective:  none  Objective   There were no vitals taken for this visit.       No intake or output data in the 24 hours ending 03/23/19 1656 There were no vitals filed for this visit.  Examination: General: chronically ill, cachectic female sedated and intubated HEENT: NCAT,  PERRLA, not tracking MMMP Lungs:diffuse wheezes, R rhonchi Cardiovascular: RRhythm and tachycardic, no m/g/r Abdomen: soft,  NT,ND, BS+ Extremities: no edema Skin: no rashes, warm and dry Neuro: moves all 4 extremities equally  Resolved Hospital Problem list   none  Assessment & Plan:  Acute on chronic hypoxic/hypercarbic resp failure:   -likely 2/2 aecopd  -titrate vent  -rass goal -1  -sat/sbt  When able  -steroids (on chronic 20mg  per pulm note in 2019), bronchodilators  -stat abg, cxr pending  -empiric abx at this time  -send cx and rvp at this time, strep and leg for completeness sake  -pct is only 0.09 and lactic negative without elevated wbc AECOPD:   -as above  ILD/BOOP:  -as above  -imaging with some progression  Pulm htn:   -revatio at home  -on 3L [place] Tobacco abuse:   -nicotine patch  Hypotension:   -on 74mcg norepi, likely related to 180mcg fentanyl  Depression/Anxiety:   -home meds     Best practice:  Diet: npo Pain/Anxiety/Delirium protocol (if indicated): per protocol VAP protocol (if indicated): per protocol DVT prophylaxis: heparin GI prophylaxis: ppi Glucose control: ssi Mobility: bed Code Status: perviously dnr but daughter stated FULL will need to clarify with pt when extubated.  Family Communication:  Disposition: ICU  Labs   CBC: No results for input(s): WBC, NEUTROABS, HGB, HCT, MCV, PLT in the last 168 hours.  Basic Metabolic Panel: No results for input(s): NA, K, CL, CO2, GLUCOSE, BUN, CREATININE, CALCIUM, MG, PHOS in the last 168 hours. GFR: CrCl cannot be calculated (  Patient's most recent lab result is older than the maximum 21 days allowed.). No results for input(s): PROCALCITON, WBC, LATICACIDVEN in the last 168 hours.  Liver Function Tests: No results for input(s): AST, ALT, ALKPHOS, BILITOT, PROT, ALBUMIN in the last 168 hours. No results for input(s): LIPASE, AMYLASE in the last 168 hours. No results for input(s): AMMONIA in the last 168 hours.  ABG    Component Value Date/Time   PHART 7.444 (H) 07/24/2007 0603   PCO2ART 48.3 (H) 07/24/2007  0603   PO2ART 108.0 (H) 07/24/2007 0603   HCO3 33.2 (H) 07/24/2007 0603   TCO2 35 07/24/2007 0603   O2SAT 98.0 07/24/2007 0603     Coagulation Profile: No results for input(s): INR, PROTIME in the last 168 hours.  Cardiac Enzymes: No results for input(s): CKTOTAL, CKMB, CKMBINDEX, TROPONINI in the last 168 hours.  HbA1C: No results found for: HGBA1C  CBG: No results for input(s): GLUCAP in the last 168 hours.  Review of Systems:   Unobtainable 2/2 pt intubated and sedated.   Past Medical History  She,  has no past medical history on file.   Surgical History   No past surgical history on file.   Social History   reports that she has been smoking cigarettes. She has a 72.00 pack-year smoking history. She has quit using smokeless tobacco.   Family History   Her family history is not on file.   Allergies Allergies  Allergen Reactions  . Ceftriaxone Anaphylaxis  . Pregabalin Other (See Comments)    Pt. Experienced joint pain, depression, confusion Pt. Experienced joint pain, depression, confusion   . Codeine     REACTION: nausea  . Itraconazole     REACTION: diarhea and nausea  . Pseudoephedrine     REACTION: hallucinate  . Sulfamethoxazole     REACTION: diarhea and nauseted     Home Medications  Prior to Admission medications   Medication Sig Start Date End Date Taking? Authorizing Provider  ALPRAZolam Duanne Moron) 1 MG tablet Take 1 mg by mouth as needed.  09/25/17   [provider]  atorvastatin (LIPITOR) 80 MG tablet Take 80 mg by mouth daily.    [provider]  Cholecalciferol (VITAMIN D-1000 MAX ST) 1000 units tablet Take by mouth.    [provider]  esomeprazole (NEXIUM) 40 MG capsule Take 40 mg by mouth daily.  09/16/17   [provider]  feeding supplement (BOOST HIGH PROTEIN) LIQD Take 1 Container by mouth 2 (two) times daily between meals.    [provider]  fluticasone (FLONASE) 50 MCG/ACT nasal spray U 1 SPR  IEN ONCE D 10/04/17   [provider]  Fluticasone-Umeclidin-Vilant (TRELEGY ELLIPTA) 100-62.5-25 MCG/INH AEPB Inhale 1 puff into the lungs daily. 11/26/17   Tanda Rockers, MD  furosemide (LASIX) 40 MG tablet Take 40 mg by mouth daily.    [provider]  gabapentin (NEURONTIN) 300 MG capsule Take 300 mg by mouth 2 (two) times daily.  09/21/17   [provider]  ipratropium-albuterol (DUONEB) 0.5-2.5 (3) MG/3ML SOLN Take 3 mLs by nebulization every 6 (six) hours.    [provider]  OXYGEN 2lpm with rest and 3lpm with exertion    [provider]  PROAIR HFA 108 (90 Base) MCG/ACT inhaler Inhale 2 puffs into the lungs every 6 (six) hours as needed.  09/16/17   [provider]  venlafaxine XR (EFFEXOR-XR) 37.5 MG 24 hr capsule Take 37.5 mg by mouth daily with breakfast.  09/16/17   [provider]     Critical care time: The patient is critically ill with multiple organ systems failure and requires high complexity decision making for assessment and support, frequent evaluation and titration of therapies, application of advanced monitoring technologies and extensive interpretation of multiple databases.  Critical care time 47 mins. This represents my time independent of the NPs time taking care of the pt. This is excluding procedures.     Audria Nine DO Pager: [phone] After hours pager: [phone]  [facility] 03/23/2019, 4:56 PM

## 2019-03-23 NOTE — Progress Notes (Signed)
Wasted 15 ml of Fentanyl from [place] with Heide Guile, Charge RN in [employer] in room.  Dewaine Oats, RN

## 2019-03-24 ENCOUNTER — Inpatient Hospital Stay (HOSPITAL_COMMUNITY): Payer: Medicare Other

## 2019-03-24 ENCOUNTER — Telehealth: Payer: Self-pay | Admitting: Internal Medicine

## 2019-03-24 DIAGNOSIS — J441 Chronic obstructive pulmonary disease with (acute) exacerbation: Secondary | ICD-10-CM

## 2019-03-24 DIAGNOSIS — I340 Nonrheumatic mitral (valve) insufficiency: Secondary | ICD-10-CM

## 2019-03-24 DIAGNOSIS — J849 Interstitial pulmonary disease, unspecified: Secondary | ICD-10-CM

## 2019-03-24 DIAGNOSIS — G9340 Encephalopathy, unspecified: Secondary | ICD-10-CM

## 2019-03-24 DIAGNOSIS — I351 Nonrheumatic aortic (valve) insufficiency: Secondary | ICD-10-CM

## 2019-03-24 LAB — BASIC METABOLIC PANEL
Anion gap: 9 (ref 5–15)
BUN: 22 mg/dL — ABNORMAL HIGH (ref 6–20)
CO2: 32 mmol/L (ref 22–32)
Calcium: 8.4 mg/dL — ABNORMAL LOW (ref 8.9–10.3)
Chloride: 95 mmol/L — ABNORMAL LOW (ref 98–111)
Creatinine, Ser: 0.73 mg/dL (ref 0.44–1.00)
GFR calc Af Amer: >60 mL/min (ref >60)
GFR calc non Af Amer: >60 mL/min (ref >60)
Glucose, Bld: 125 mg/dL — ABNORMAL HIGH (ref 70–99)
Potassium: 4 mmol/L (ref 3.5–5.1)
Sodium: 136 mmol/L (ref 135–145)

## 2019-03-24 LAB — CBC
HCT: 26 % — ABNORMAL LOW (ref 36.0–46.0)
Hemoglobin: 7.9 g/dL — ABNORMAL LOW (ref 12.0–15.0)
MCH: 30.6 pg (ref 26.0–34.0)
MCHC: 30.4 g/dL (ref 30.0–36.0)
MCV: 100.8 fL — ABNORMAL HIGH (ref 80.0–100.0)
Platelets: 438 10*3/uL — ABNORMAL HIGH (ref 150–400)
RBC: 2.58 MIL/uL — ABNORMAL LOW (ref 3.87–5.11)
RDW: 16.9 % — ABNORMAL HIGH (ref 11.5–15.5)
WBC: 7.3 10*3/uL (ref 4.0–10.5)
nRBC: 0 % (ref 0.0–0.2)

## 2019-03-24 LAB — GLUCOSE, CAPILLARY
Glucose-Capillary: 108 mg/dL — ABNORMAL HIGH (ref 70–99)
Glucose-Capillary: 123 mg/dL — ABNORMAL HIGH (ref 70–99)
Glucose-Capillary: 123 mg/dL — ABNORMAL HIGH (ref 70–99)
Glucose-Capillary: 142 mg/dL — ABNORMAL HIGH (ref 70–99)
Glucose-Capillary: 88 mg/dL (ref 70–99)
Glucose-Capillary: 91 mg/dL (ref 70–99)
Glucose-Capillary: 91 mg/dL (ref 70–99)

## 2019-03-24 LAB — ECHOCARDIOGRAM COMPLETE
Height: 64.5 in
Weight: 1488.55 oz

## 2019-03-24 MED ORDER — ORAL CARE MOUTH RINSE
15.0000 mL | Freq: Two times a day (BID) | OROMUCOSAL | Status: DC
  Administered 2019-03-24 – 2019-03-30 (×10): 15 mL via OROMUCOSAL

## 2019-03-24 MED ORDER — METHYLPREDNISOLONE SODIUM SUCC 40 MG IJ SOLR
40.0000 mg | Freq: Two times a day (BID) | INTRAMUSCULAR | Status: DC
  Administered 2019-03-24 – 2019-03-26 (×4): 40 mg via INTRAVENOUS
  Filled 2019-03-24 (×4): qty 1

## 2019-03-24 MED ORDER — ALPRAZOLAM 0.25 MG PO TABS
0.2500 mg | ORAL_TABLET | Freq: Three times a day (TID) | ORAL | Status: DC
  Administered 2019-03-24 – 2019-03-25 (×4): 0.25 mg via NASOGASTRIC
  Filled 2019-03-24 (×4): qty 1

## 2019-03-24 MED ORDER — PANTOPRAZOLE SODIUM 40 MG PO TBEC
40.0000 mg | DELAYED_RELEASE_TABLET | Freq: Every day | ORAL | Status: DC
  Administered 2019-03-24 – 2019-03-30 (×7): 40 mg via ORAL
  Filled 2019-03-24 (×7): qty 1

## 2019-03-24 MED FILL — Albuterol Sulfate Soln Nebu 0.083% (2.5 MG/3ML): RESPIRATORY_TRACT | Qty: 3 | Status: AC

## 2019-03-24 NOTE — Progress Notes (Deleted)
Sputum culture collected and sent to the lab. 

## 2019-03-24 NOTE — Progress Notes (Addendum)
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 Limited history is available as pt is intubated and sedated on arrival. She was reportedly noted by her daughter to be unresponsive on the afternoon of 7/29 with drool coming from mouth.   Ems was called and pt was found to be hypoxic. Upon arrival to [facility] ED pt was found to be hypercarbic with CO2 in 90's pH 7.2. She was attempted on bipap without improvement and subsequently intubated. In looking thru the chart there was some note of hypotension req norepi and perhaps a code stroke but no records of that either.   She arrived to our ICU awake and moving all 4 EXT but not to commands. She was quickly weaned to 40% FiO2 on vent.  Plan for sedation to be weaned down for potential extubation.   Past Medical History  No past medical history on file.  Significant Hospital Events   7/30: transferred to [facility]  Consults:  NA  Procedures:  7/30 ETT OSH-> 7/30 R fem CVC OSH->  Significant Diagnostic Tests:    Micro Data:  7/30 SARS 2 COVID19 (from OSH): negative 7/30 MRSA surveillance negative  7/30 blood 7/30 urine 7/30 resp 7/30 strep/leg:   Antimicrobials:  7/30 azithro>>7/31 7/30 doxy>>7/31   Interim history/subjective:  Agitated overnight.  Apneic on SBT.  Pulled out IV. Calm when redirected.  Afebrile.  Anxious. Wants ETT out.  Objective   Blood pressure 95/72, pulse 88, temperature 98.3 F (36.8 C), temperature source Oral, resp. rate (!) 24, height 5' 4.5" (1.638 m), weight 42.2 kg, SpO2 100 %.    Vent Mode: PRVC FiO2 (%):  [40  %-100 %] 40 % Set Rate:  [24 bmp] 24 bmp Vt Set:  [400 mL] 400 mL PEEP:  [5 cmH20-8 cmH20] 5 cmH20 Plateau Pressure:  [26 cmH20-28 cmH20] 26 cmH20   Intake/Output Summary (Last 24 hours) at 03/24/2019 0911 Last data filed at 03/24/2019 0800 Gross per 24 hour  Intake 2001.71 ml  Output 510 ml  Net 1491.71 ml   Filed Weights   03/23/19 1800 03/24/19 0259  Weight: 44 kg 42.2 kg    Examination: General: chronically ill, cachectic female, sitting up in bed, NAD HEENT: Cephalic.  Sunken orbits.PERRL.  Mucous membranes Lungs: Diffuse rhonchi, full, nonlabored, symmetrical.  Vent supported. Cardiovascular:RRR. S1S2. No MRG Abdomen: +BS x4. SNT/ND. Extremities: MAE well. No edema Skin: PWD. In tact.  Neuro: A&O. Follows commands.  Communicates by nodding and shaking head and hand gestures.  Resolved Hospital Problem list   none  Assessment & Plan:  Acute on chronic hypoxic/hypercarbic resp failure: -likely 2/2 aecopd.  Baseline 3 L nasal cannula at home.  No leukocytosis or fever.  Procalcitonin normal. Plan - Likely be a difficult wean.  Note that patient was DNR/DNI in the past. - RASS goal 0 - SBT as able - Wean steroids (on chronic 20mg  per pulm note in 2019),  - Bronchodilators - Discontinue antibiotics - Resume home Xanax as patient does have anxiety likely complicated by longstanding COPD    AECOPD:  Plan -as above  ILD/BOOP: Was followed by Dr. Melvyn Novas in the past Plan -as above -imaging with some progression   History of Pulm HTN: Plan -revatio on home med list-pharmacy to confirm home meds   Tobacco abuse:  Plan -nicotine patch  Hypotension: Likely secondary to sedation as pressors were quickly weaned.  Vital signs now stable. Plan -Monitor vital signs  Depression/Anxiety:  Plan -Continue home meds     Best practice:  Diet: NPO.  Tube feeds today if unable to extubate Pain/Anxiety/Delirium protocol (if indicated): per protocol VAP protocol (if  indicated): per protocol DVT prophylaxis: heparin GI prophylaxis: ppi Glucose control: ssi Mobility: bed Code Status: perviously DNR but daughter stated FULL will need to clarify.  Family Communication: Will update Disposition: ICU  Labs and imaging reviewed in EMR     The patient is critically ill with acute on chronic respiratory failure. She requires ICU for high complexity decision making, titration of high alert medications, ventilator management, titration of oxygen and interpretation of advanced monitoring.     I spent 45 minutes in direct patient care including reviewing data,  discussing with other providers, assessment, planning and stabilization and documentation. Time is exclusive to this patient and does not include procedures.   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  No events overnight.  On exam, tolerating PS well with diminished BS diffusely.  I reviewed CXR myself, ETT is in a good position and infiltrate is noted.  Discussed with PCCM-NP.  Will proceed with extubation today.  Set up f/u in 2 wks with PCCM.  Will come back in the afternoon if patient is stable will transfer out of the ICU and to [facility] with PCCM off 8/1  The patient is critically ill with multiple organ systems failure and requires high complexity decision making for assessment and support, frequent evaluation and titration of therapies, application of advanced monitoring technologies and extensive interpretation of multiple databases.   Critical Care Time devoted to patient care services described in this note is  35  Minutes. This time reflects time of care of this signee Dr Jennet Maduro. This critical care time does not reflect procedure time, or teaching time or supervisory time of PA/NP/Med student/Med  Resident etc but could involve care discussion time.  Rush Farmer, M.D. [facility] Pulmonary/Critical Care Medicine. Pager: [phone]. After hours pager: [phone].

## 2019-03-24 NOTE — Evaluation (Signed)
  Clinical/Bedside Swallow Evaluation Patient Details  Name: Krystal Cantu MRN: [id number] Date of Birth: 21-Jul-1961  Today's Date: 03/24/2019 Time: SLP Start Time (ACUTE ONLY): 1510 SLP Stop Time (ACUTE ONLY): 1530 SLP Time Calculation (min) (ACUTE ONLY): 20 min  Past Medical History: No past medical history on file. Past Surgical History: No past surgical history on file. 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  Extubated 7/31 am.  Pt states she has lost close to 20 lbs in recent months.    Assessment / Plan / Recommendation Clinical Impression  Pt presents with normal swallow function marked by adequate mastication, brisk swallow trigger, no s/s of aspiration.  Respiratory/swallow synchrony WNL.  Pt able to self-feed without difficulty.  Resume a regular diet. No SLP f/u needed; D/W RN.  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  Extubated 7/31 am.  Pt states she has lost close to 20 lbs in recent months.  Type of Study: Bedside Swallow Evaluation Previous Swallow Assessment: no Diet Prior to this Study: NPO Temperature Spikes Noted: No Respiratory Status: Nasal cannula History of Recent Intubation: Yes Length of Intubations (days): 2 days Date extubated: 03/24/19 Behavior/Cognition:  Alert;Cooperative Oral Cavity Assessment: Within Functional Limits Oral Care Completed by SLP: Recent completion by staff Oral Cavity - Dentition: Adequate natural dentition Vision: Functional for self-feeding Self-Feeding Abilities: Able to feed self Patient Positioning: Upright in bed Baseline Vocal Quality: Hoarse Volitional Cough: Strong Volitional Swallow: Able to elicit    Oral/Motor/Sensory Function Overall Oral Motor/Sensory Function: Within functional limits   Ice Chips Ice chips: Within functional limits   Thin Liquid Thin Liquid: Within functional limits    Nectar Thick Nectar Thick Liquid: Not tested   Honey Thick Honey Thick Liquid: Not tested   Puree Puree: Within functional limits   Solid     Solid: Within functional limits      Juan Quam Laurice 03/24/2019,3:32 PM

## 2019-03-24 NOTE — Progress Notes (Signed)
Pt successfully extubated and is alert and oriented x4. CODE STATUS reviewed.  We discussed goals of the patient, the likelihood of an acceptable outcome given the clinical situation, what CPR would entail including intubation, defibrillation, chest compressions, and the potential outcomes including inability to wean patient from the ventilator necessitating prolonged mechanical ventilation and alternative airway methods as well as alternative feeding methods.  After discussion the patient wishes to be FULL CODE.   Patient may transfer to progressive care.  Discussed with hospitalist.

## 2019-03-24 NOTE — Progress Notes (Signed)
  Echocardiogram 2D Echocardiogram has been performed.  Krystal Cantu 03/24/2019, 10:22 AM

## 2019-03-24 NOTE — Telephone Encounter (Signed)
Received staff message from Dr. 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  Thanks   Erskine Emery    Pt is currently admitted in the hospital. When pt is discharged, we can either get her in for an appt with Dr. Melvyn Novas or APP. Will route to triage so that way the encounter can be kept an eye on for when pt is discharged from the hospital.

## 2019-03-24 NOTE — Progress Notes (Signed)
[place] Progress Note Patient Name: Krystal Cantu DOB: [birth year] MRN: [id number]   Date of Service  03/24/2019  HPI/Events of Note  Oliguria  eICU Interventions  Will ask nursing to bladder scan patient.      Intervention Category Intermediate Interventions: Oliguria - evaluation and management  Bekka Qian Eugene 03/24/2019, 6:42 AM

## 2019-03-24 NOTE — Procedures (Signed)
Extubation Procedure Note  Patient Details:   Name: Seryna Marek DOB: 1961/03/05 MRN: [id number]   Airway Documentation:    Vent end date: 03/24/19 Vent end time: 1049   Evaluation  O2 sats: stable throughout Complications: No apparent complications Patient did tolerate procedure well. Bilateral Breath Sounds: Expiratory wheezes, Diminished  Patient able to speak Yes  Rudene Re 03/24/2019, 10:50 AM

## 2019-03-25 ENCOUNTER — Other Ambulatory Visit: Payer: Self-pay

## 2019-03-25 LAB — CBC
HCT: 26.3 % — ABNORMAL LOW (ref 36.0–46.0)
Hemoglobin: 8.1 g/dL — ABNORMAL LOW (ref 12.0–15.0)
MCH: 31.4 pg (ref 26.0–34.0)
MCHC: 30.8 g/dL (ref 30.0–36.0)
MCV: 101.9 fL — ABNORMAL HIGH (ref 80.0–100.0)
Platelets: 411 10*3/uL — ABNORMAL HIGH (ref 150–400)
RBC: 2.58 MIL/uL — ABNORMAL LOW (ref 3.87–5.11)
RDW: 17.2 % — ABNORMAL HIGH (ref 11.5–15.5)
WBC: 5.1 10*3/uL (ref 4.0–10.5)
nRBC: 0.4 % — ABNORMAL HIGH (ref 0.0–0.2)

## 2019-03-25 LAB — BASIC METABOLIC PANEL
Anion gap: 7 (ref 5–15)
BUN: 20 mg/dL (ref 6–20)
CO2: 35 mmol/L — ABNORMAL HIGH (ref 22–32)
Calcium: 8.5 mg/dL — ABNORMAL LOW (ref 8.9–10.3)
Chloride: 96 mmol/L — ABNORMAL LOW (ref 98–111)
Creatinine, Ser: 0.59 mg/dL (ref 0.44–1.00)
GFR calc Af Amer: >60 mL/min (ref >60)
GFR calc non Af Amer: >60 mL/min (ref >60)
Glucose, Bld: 100 mg/dL — ABNORMAL HIGH (ref 70–99)
Potassium: 4.2 mmol/L (ref 3.5–5.1)
Sodium: 138 mmol/L (ref 135–145)

## 2019-03-25 LAB — GLUCOSE, CAPILLARY
Glucose-Capillary: 121 mg/dL — ABNORMAL HIGH (ref 70–99)
Glucose-Capillary: 151 mg/dL — ABNORMAL HIGH (ref 70–99)
Glucose-Capillary: 162 mg/dL — ABNORMAL HIGH (ref 70–99)
Glucose-Capillary: 189 mg/dL — ABNORMAL HIGH (ref 70–99)
Glucose-Capillary: 221 mg/dL — ABNORMAL HIGH (ref 70–99)
Glucose-Capillary: 78 mg/dL (ref 70–99)
Glucose-Capillary: 92 mg/dL (ref 70–99)
Glucose-Capillary: 92 mg/dL (ref 70–99)

## 2019-03-25 LAB — BLOOD CULTURE ID PANEL (REFLEXED)

## 2019-03-25 LAB — PHOSPHORUS: Phosphorus: 3.3 mg/dL (ref 2.5–4.6)

## 2019-03-25 LAB — MAGNESIUM: Magnesium: 1.9 mg/dL (ref 1.7–2.4)

## 2019-03-25 MED ORDER — GABAPENTIN 300 MG PO CAPS
300.0000 mg | ORAL_CAPSULE | Freq: Three times a day (TID) | ORAL | Status: DC
  Administered 2019-03-25 – 2019-03-30 (×16): 300 mg via ORAL
  Filled 2019-03-25 (×16): qty 1

## 2019-03-25 MED ORDER — IPRATROPIUM-ALBUTEROL 0.5-2.5 (3) MG/3ML IN SOLN
3.0000 mL | Freq: Four times a day (QID) | RESPIRATORY_TRACT | Status: DC
  Administered 2019-03-25 – 2019-03-26 (×4): 3 mL via RESPIRATORY_TRACT
  Filled 2019-03-25 (×4): qty 3

## 2019-03-25 MED ORDER — ALPRAZOLAM 0.25 MG PO TABS: 0.2500 mg | ORAL_TABLET | Freq: Three times a day (TID) | ORAL | Status: DC

## 2019-03-25 MED ORDER — ACETAMINOPHEN 325 MG PO TABS: 650.0000 mg | ORAL_TABLET | ORAL | Status: DC | PRN

## 2019-03-25 MED ORDER — CLONAZEPAM 1 MG PO TABS
2.0000 mg | ORAL_TABLET | Freq: Two times a day (BID) | ORAL | Status: DC
  Administered 2019-03-25 – 2019-03-29 (×10): 2 mg via ORAL
  Filled 2019-03-25 (×11): qty 2

## 2019-03-25 MED ORDER — MELATONIN 3 MG PO TABS
6.0000 mg | ORAL_TABLET | Freq: Every day | ORAL | Status: DC
  Administered 2019-03-25 – 2019-03-29 (×5): 6 mg via ORAL
  Filled 2019-03-25 (×7): qty 2

## 2019-03-25 MED ORDER — ALPRAZOLAM 0.25 MG PO TABS: 0.2500 mg | ORAL_TABLET | Freq: Three times a day (TID) | ORAL | Status: DC | PRN

## 2019-03-25 MED ORDER — VENLAFAXINE HCL ER 75 MG PO CP24
75.0000 mg | ORAL_CAPSULE | Freq: Every day | ORAL | Status: DC
  Administered 2019-03-26 – 2019-03-30 (×5): 75 mg via ORAL
  Filled 2019-03-25 (×6): qty 1

## 2019-03-25 NOTE — Progress Notes (Signed)
Rehab Admissions Coordinator Note:  Per PT recommendation this patient was screened by Jhonnie Garner for appropriateness for an Inpatient Acute Rehab Consult.  Noted pt Min A for transfers and ambulation on eval. Would like to see how she does on follow up session as pt may progress quickly. If deficits persist, will request consult order. Pt would also need OT order for evaluaiton if she is to be considered for CIR. Will follow.   Jhonnie Garner 03/25/2019, 2:01 PM  I can be reached at [phone].

## 2019-03-25 NOTE — Evaluation (Signed)
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 admitted for acute resp failure req mechanical ventilation.  Clinical Impression  Pt admitted with above complications. Pt currently with functional limitations due to the deficits listed below (see PT Problem List). Previously independent with 3L home O2 use, no device, community dwelling although does not drive. Reports baseline of some balance deficits. Today required min assist for all mobility, LOB towards right with gait. Lives alone but family checks on her regularly. Amendable to rehabilitation. Clearly deconditioned with increased fall risk with loss of strength, balance, and general functional mobility. If not a candidate for short term CIR, will need SNF prior to returning home. Pt will benefit from skilled PT to increase their independence and safety with mobility to allow discharge to the venue listed below.       Follow Up Recommendations CIR    Equipment Recommendations  None recommended by PT    Recommendations for Other Services Rehab consult;OT consult     Precautions / Restrictions Precautions Precautions: Fall Restrictions Weight Bearing Restrictions: No      Mobility  Bed Mobility Overal bed mobility: Needs Assistance Bed Mobility: Supine to Sit;Sit to Supine     Supine to sit: Min guard;HOB elevated Sit to supine: Min assist   General bed mobility comments: Min assist for LEs back into bed, cues for techniques. OOB pt able to perform on her own with some assist to untangle lines/leads and other items, required extra time.  Transfers Overall transfer level: Needs assistance Equipment used: Rolling walker (2 wheeled) Transfers: Sit to/from Stand Sit to Stand: Min assist          General transfer comment: Min assist for balance when rising from bed. Steady once upright on RW. VC for technique.  Ambulation/Gait Ambulation/Gait assistance: Min assist Gait Distance (Feet): 55 Feet Assistive device: Rolling walker (2 wheeled) Gait Pattern/deviations: Step-through pattern;Decreased stride length;Narrow base of support Gait velocity: slow Gait velocity interpretation: <1.31 ft/sec, indicative of household ambulator General Gait Details: Educated on safe DME use with a rolling walker. Intermittent LOB towards the right requiring min assist. SpO2 down to 84% on 3L, improves to mid 90s on 4L. Cues for pursed lip breathing, energy conservation techniques. Required several standing rest breaks to complete distance.  Stairs            Wheelchair Mobility    Modified Rankin (Stroke Patients Only)       Balance Overall balance assessment: Needs assistance         Standing balance support: No upper extremity supported Standing balance-Leahy Scale: Fair Standing balance comment: Abld to release RW and stand with close supervision, increased sway noted.         Rhomberg - Eyes Opened: (Loss of balance, min assist.)     High Level Balance Comments: Unable to raise heels, LOB with toe raises. Unable to attempt/complete SLS.             Pertinent Vitals/Pain Pain Assessment: No/denies pain    Home Living Family/patient expects to be discharged to:: Private residence Living Arrangements: Alone Available Help at Discharge: Family;Available PRN/intermittently Type of Home: Apartment Home Access: Stairs to enter Entrance Stairs-Rails: None Entrance Stairs-Number of Steps: 2 Home Layout: One level Home Equipment: Walker - 2  wheels;Cane - single point;Bedside commode;Tub bench      Prior Function Level of Independence: Needs assistance   Gait / Transfers Assistance Needed: independent with gait in house  ADL's / Homemaking Assistance Needed: Brother  would do shopping for her,. Does not drive.        Hand Dominance   Dominant Hand: Right    Extremity/Trunk Assessment   Upper Extremity Assessment Upper Extremity Assessment: Defer to OT evaluation    Lower Extremity Assessment Lower Extremity Assessment: Overall WFL for tasks assessed       Communication   Communication: No difficulties  Cognition Arousal/Alertness: Awake/alert Behavior During Therapy: WFL for tasks assessed/performed Overall Cognitive Status: Within Functional Limits for tasks assessed                                        General Comments General comments (skin integrity, edema, etc.): SpO2 84% on 3L with gait, 100% on 3L at rest, mid 90s on 4L with gait. Emotional due to weakness and loss of function since admission.    Exercises General Exercises - Lower Extremity Ankle Circles/Pumps: AROM;Both;15 reps;Supine Quad Sets: Strengthening;Both;10 reps;Seated Gluteal Sets: Strengthening;Both;10 reps;Seated   Assessment/Plan    PT Assessment Patient needs continued PT services  PT Problem List Decreased strength;Decreased activity tolerance;Decreased balance;Decreased mobility;Decreased knowledge of use of DME;Cardiopulmonary status limiting activity       PT Treatment Interventions DME instruction;Gait training;Functional mobility training;Therapeutic activities;Therapeutic exercise;Balance training;Patient/family education    PT Goals (Current goals can be found in the Care Plan section)  Acute Rehab PT Goals Patient Stated Goal: Get stronger before going home PT Goal Formulation: With patient Time For Goal Achievement: 04/08/19 Potential to Achieve Goals: Good    Frequency Min 3X/week   Barriers to discharge Decreased caregiver support lives alone    Co-evaluation               AM-PAC PT "6 Clicks" Mobility  Outcome Measure Help needed turning from your back to your side while in a flat bed without using  bedrails?: A Little Help needed moving from lying on your back to sitting on the side of a flat bed without using bedrails?: A Little Help needed moving to and from a bed to a chair (including a wheelchair)?: A Little Help needed standing up from a chair using your arms (e.g., wheelchair or bedside chair)?: A Little Help needed to walk in hospital room?: A Lot Help needed climbing 3-5 steps with a railing? : Total 6 Click Score: 15    End of Session Equipment Utilized During Treatment: Gait belt;Oxygen Activity Tolerance: Patient limited by fatigue Patient left: in bed;with call bell/phone within reach;with SCD's reapplied Nurse Communication: Mobility status PT Visit Diagnosis: Unsteadiness on feet (R26.81);Other abnormalities of gait and mobility (R26.89);History of falling (Z91.81);Difficulty in walking, not elsewhere classified (R26.2);Adult, failure to thrive (R62.7)    Time: 1216-1242 PT Time Calculation (min) (ACUTE ONLY): 26 min   Charges:   PT Evaluation $PT Eval High Complexity: 1 High PT Treatments $Gait Training: 8-22 mins        Elayne Snare, PT, DPT  Ellouise Newer 03/25/2019, 1:28 PM

## 2019-03-25 NOTE — Plan of Care (Signed)
Acute rehab goals established. 

## 2019-03-25 NOTE — Progress Notes (Signed)
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  She was found unresponsive on the afternoon of 03/22/2019 and drooling by her daughter, EMS brought patient to the [facility] ER where she was hypoxic.  Hypercarbic with CO2 of 90 and pH 7.2.  BiPAP without improvement.  Patient was ultimately intubated and transferred to [facility] ICU.  She was transiently on norepinephrine.  Patient was ultimately extubated to nasal cannula oxygen on 03/24/2019 and transferred to general medical floor.  She is treated as COPD exacerbation. 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  Assessment & Plan:   Active Problems:   Acute on chronic respiratory failure with hypoxia and hypercapnia (HCC)  Acute on chronic respiratory hypoxic and hypercapnic respiratory failure due to COPD exacerbation: Aggressive bronchodilator therapy, IV steroids, inhalational steroids, scheduled and as needed bronchodilators, deep breathing exercises, incentive spirometry, chest physiotherapy and respiratory therapy consult.  Antibiotics were discontinued. Supplemental oxygen to keep saturations more than 92%. PT and OT.  Positive blood cultures: 1 of the 2 bottles showing gram-positive cocci.  Patient with no evidence of obvious infection.  Probably contaminant.  Will wait until final cultures.  Unresponsiveness: Review of records from [facility] showed multiple ER visits which was evaluated with MRI, CT brain EEG and neurology consultation and was thought to be Xanax withdrawal.  CT head this admission is negative.  Patient with no neurological deficits.  History of pulmonary hypertension: On Revatio at home.   Will resume on discharge.  Smoker: On nicotine patch.  Depression anxiety: On Xanax at home.  She is also on Effexor that we will continue.  Deconditioning: Work with PT OT.  Will refer to inpatient therapies.   DVT prophylaxis: Heparin subcu Code Status: Full code Family Communication: Attempted to call her daughter.  She is also in the hospital at [facility]. Disposition Plan: Unknown at this time.  Anticipate SNF.   Consultants:   PCCM, signed off  Procedures:   None  Antimicrobials:   azithromycin, 03/23/2019-03/24/2019   Subjective: Patient was seen and examined.  No overnight events.  She was complaining of episode of hypoglycemia which was not real.  Her lowest blood sugar was 92.  Her breathing is better. Patient was wondering about possible seizure episode she had before going to [facility] ER.  I reviewed her records and had previous reports of benzo withdrawals.  Patient is on clonazepam scheduled.  Will restart.   Objective: Vitals:   03/25/19 0731 03/25/19 0835 03/25/19 1114 03/25/19 1142  BP: 91/67  100/66   Pulse: 88  96   Resp: (!) 22  (!) 27   Temp: 97.7 F (36.5 C)  98.2 F (36.8 C)   TempSrc: Oral  Oral   SpO2: 100% 100% 100% 100%  Weight:      Height:        Intake/Output Summary (Last 24 hours) at 03/25/2019 1401 Last data filed at 03/25/2019 0510 Gross per 24 hour  Intake 20 ml  Output 430 ml  Net -410 ml   Filed Weights   03/23/19 1800 03/24/19 0259  Weight: 44 kg 42.2 kg    Examination:  General exam: Appears calm and comfortable, anxious, chronically sick looking.  Not in any distress.  She is currently on 3 L of oxygen. Respiratory system: Clear to auscultation. Respiratory effort normal.  No added sounds. Cardiovascular system: S1 & S2 heard, RRR. No JVD, murmurs, rubs, gallops or clicks. No pedal edema. Gastrointestinal system: Abdomen is nondistended, soft and nontender. No organomegaly or masses felt. Normal bowel sounds heard.  Central nervous system: Alert and oriented. No focal neurological deficits. Extremities: Symmetric 5 x 5 power. Skin: No rashes, lesions or ulcers Psychiatry: Judgement and insight appear normal. Mood & affect anxious.    Data Reviewed: I have personally reviewed following labs and imaging studies  CBC: Recent Labs  Lab 03/23/19 1911 03/23/19 2008 03/24/19 0356 03/25/19 0504  WBC 8.6  --  7.3 5.1  HGB 8.1* 8.5* 7.9* 8.1*  HCT 26.5* 25.0* 26.0* 26.3*  MCV 101.5*  --  100.8* 101.9*  PLT 428*  --  438* 710*   Basic Metabolic Panel: Recent Labs  Lab 03/23/19 1738 03/23/19 2008 03/23/19 2215 03/24/19 0356 03/25/19 0504  NA 139 136 136 136 138  K 5.3* 4.2 4.1 4.0 4.2  CL 94*  --  94* 95* 96*  CO2 37*  --  31 32 35*  GLUCOSE 118*  --  106* 125* 100*  BUN 23*  --  22* 22* 20  CREATININE 0.88  --  0.80 0.73 0.59  CALCIUM 8.7*  --  8.2* 8.4* 8.5*  MG 1.9  --   --   --  1.9  PHOS 4.2  --   --   --  3.3   GFR: Estimated Creatinine Clearance: 51.1 mL/min (by C-G formula based on SCr of 0.59 mg/dL). Liver Function Tests: No results for input(s): AST, ALT, ALKPHOS, BILITOT, PROT, ALBUMIN in the last 168 hours. No results for input(s): LIPASE, AMYLASE in the last 168 hours. No results for input(s): AMMONIA in the last 168 hours. Coagulation Profile: No results for input(s): INR, PROTIME in the last 168 hours. Cardiac Enzymes: No results for input(s): CKTOTAL, CKMB, CKMBINDEX, TROPONINI in the last 168 hours. BNP (last 3 results) No results for input(s): PROBNP in the last 8760 hours. HbA1C: Recent Labs    03/23/19 1738  HGBA1C 5.6   CBG: Recent Labs  Lab 03/25/19 0344 03/25/19 0514 03/25/19 0618 03/25/19 0729 03/25/19 1115  GLUCAP 92 78 162* 221* 92   Lipid Profile: No results for input(s): CHOL, HDL, LDLCALC, TRIG, CHOLHDL, LDLDIRECT in the last 72 hours. Thyroid Function Tests: No results for input(s): TSH, T4TOTAL, FREET4, T3FREE, THYROIDAB in the last 72  hours. Anemia Panel: No results for input(s): VITAMINB12, FOLATE, FERRITIN, TIBC, IRON, RETICCTPCT in the last 72 hours. Sepsis Labs: Recent Labs  Lab 03/23/19 1738 03/23/19 2022  PROCALCITON <0.10  --   LATICACIDVEN 3.2* 1.5    Recent Results (from the past 240 hour(s))  MRSA PCR Screening     Status: None   Collection Time: 03/23/19  5:07 PM   Specimen: Nasal Mucosa; Nasopharyngeal  Result Value Ref Range Status   MRSA by PCR NEGATIVE NEGATIVE Final    Comment:        The GeneXpert MRSA Assay (FDA approved for NASAL specimens only), is one component of a comprehensive MRSA colonization surveillance program. It is not intended to diagnose MRSA infection nor to guide or monitor treatment for MRSA infections. Performed at [facility], [place] [address] [place], [place] [address]   Culture, blood (routine x 2)     Status: None (Preliminary result)  Collection Time: 03/23/19  7:00 PM   Specimen: BLOOD  Result Value Ref Range Status   Specimen Description BLOOD RIGHT ANTECUBITAL  Final   Special Requests   Final    BOTTLES DRAWN AEROBIC AND ANAEROBIC Blood Culture adequate volume   Culture  Setup Time   Final    GRAM POSITIVE COCCI IN CLUSTERS AEROBIC BOTTLE ONLY CRITICAL RESULT CALLED TO, READ BACK BY AND VERIFIED WITH: Salli Real [id number] 03/25/2019 Mena Goes Performed at [facility], [address], [place], [place] [address]    Culture GRAM POSITIVE COCCI  Final   Report Status PENDING  Incomplete  Blood Culture ID Panel (Reflexed)     Status: None   Collection Time: 03/23/19  7:00 PM  Result Value Ref Range Status   Enterococcus species NOT DETECTED NOT DETECTED Final   Listeria monocytogenes NOT DETECTED NOT DETECTED Final   Staphylococcus species NOT DETECTED NOT DETECTED Final   Staphylococcus aureus (BCID) NOT DETECTED NOT DETECTED Final   Streptococcus species NOT DETECTED NOT DETECTED Final   Streptococcus agalactiae NOT DETECTED NOT DETECTED  Final   Streptococcus pneumoniae NOT DETECTED NOT DETECTED Final   Streptococcus pyogenes NOT DETECTED NOT DETECTED Final   Acinetobacter baumannii NOT DETECTED NOT DETECTED Final   Enterobacteriaceae species NOT DETECTED NOT DETECTED Final   Enterobacter cloacae complex NOT DETECTED NOT DETECTED Final   Escherichia coli NOT DETECTED NOT DETECTED Final   Klebsiella oxytoca NOT DETECTED NOT DETECTED Final   Klebsiella pneumoniae NOT DETECTED NOT DETECTED Final   Proteus species NOT DETECTED NOT DETECTED Final   Serratia marcescens NOT DETECTED NOT DETECTED Final   Haemophilus influenzae NOT DETECTED NOT DETECTED Final   Neisseria meningitidis NOT DETECTED NOT DETECTED Final   Pseudomonas aeruginosa NOT DETECTED NOT DETECTED Final   Candida albicans NOT DETECTED NOT DETECTED Final   Candida glabrata NOT DETECTED NOT DETECTED Final   Candida krusei NOT DETECTED NOT DETECTED Final   Candida parapsilosis NOT DETECTED NOT DETECTED Final   Candida tropicalis NOT DETECTED NOT DETECTED Final    Comment: Performed at [facility], [address], [place], [place] [address]  Culture, blood (routine x 2)     Status: None (Preliminary result)   Collection Time: 03/23/19  7:15 PM   Specimen: BLOOD RIGHT HAND  Result Value Ref Range Status   Specimen Description BLOOD RIGHT HAND  Final   Special Requests   Final    BOTTLES DRAWN AEROBIC ONLY Blood Culture results may not be optimal due to an excessive volume of blood received in culture bottles   Culture   Final    NO GROWTH 2 DAYS Performed at [facility], [place] [address], [place], [place] [address]    Report Status PENDING  Incomplete         Radiology Studies: Dg Chest Port 1 View  Result Date: 03/23/2019 CLINICAL DATA:  Respiratory failure. Endotracheal tube placement. EXAM: PORTABLE CHEST 1 VIEW 5:30 p.m. COMPARISON:  03/23/2019 at 6:15 a.m. FINDINGS: Endotracheal tube is 4.7 cm above the carina. NG tube tip is in the body  of the stomach. Heart size and pulmonary vascularity are normal. There is diffuse accentuation of the interstitial markings particularly in the right upper lobe where there is an ill-defined infiltrate. There is also streaky infiltrate in the left lung apex. However, both of these areas of infiltrate have improved since the prior study of earlier on the same date. No discrete effusion. IMPRESSION:  1. Endotracheal tube and NG tube appear in good position. 2. Slightly improving bilateral infiltrates. Electronically Signed   By: Lorriane Shire M.D.   On: 03/23/2019 17:43        Scheduled Meds: . ALPRAZolam  0.25 mg Per NG tube TID  . Chlorhexidine Gluconate Cloth  6 each Topical Daily  . heparin  5,000 Units Subcutaneous Q8H  . ipratropium-albuterol  3 mL Nebulization Q4H  . mouth rinse  15 mL Mouth Rinse BID  . methylPREDNISolone (SOLU-MEDROL) injection  40 mg Intravenous Q12H  . pantoprazole  40 mg Oral Daily  . sodium chloride flush  10-40 mL Intracatheter Q12H   Continuous Infusions: . sodium chloride Stopped (03/23/19 2354)     LOS: 2 days    Time spent: 35 minutes    Barb Merino, MD [facility] Pager [phone]  If 7PM-7AM, please contact night-coverage [email or web address] Password [email or web address] 03/25/2019, 2:01 PM

## 2019-03-25 NOTE — Progress Notes (Signed)
PHARMACY - PHYSICIAN COMMUNICATION CRITICAL VALUE ALERT - BLOOD CULTURE IDENTIFICATION (BCID)  Krystal Cantu is an 58 y.o. female who presented to [facility] on 03/23/2019 with a chief complaint of VDRF  Assessment:  1/2 blood cultures growing gram positive cocci in cultures, BCID unable to identify organism.   Pt has been afebrile, without leukocytosis, and procalcitonin not elevated.  Likely contaminant.  Name of physician (or Provider) Contacted:  Cherlynn Kaiser via secure chat  Current antibiotics: None  Changes to prescribed antibiotics recommended:    No changes recommended  Results for orders placed or performed during the hospital encounter of 03/23/19  Blood Culture ID Panel (Reflexed) (Collected: 03/23/2019  7:00 PM)  Result Value Ref Range   Enterococcus species NOT DETECTED NOT DETECTED   Listeria monocytogenes NOT DETECTED NOT DETECTED   Staphylococcus species NOT DETECTED NOT DETECTED   Staphylococcus aureus (BCID) NOT DETECTED NOT DETECTED   Streptococcus species NOT DETECTED NOT DETECTED   Streptococcus agalactiae NOT DETECTED NOT DETECTED   Streptococcus pneumoniae NOT DETECTED NOT DETECTED   Streptococcus pyogenes NOT DETECTED NOT DETECTED   Acinetobacter baumannii NOT DETECTED NOT DETECTED   Enterobacteriaceae species NOT DETECTED NOT DETECTED   Enterobacter cloacae complex NOT DETECTED NOT DETECTED   Escherichia coli NOT DETECTED NOT DETECTED   Klebsiella oxytoca NOT DETECTED NOT DETECTED   Klebsiella pneumoniae NOT DETECTED NOT DETECTED   Proteus species NOT DETECTED NOT DETECTED   Serratia marcescens NOT DETECTED NOT DETECTED   Haemophilus influenzae NOT DETECTED NOT DETECTED   Neisseria meningitidis NOT DETECTED NOT DETECTED   Pseudomonas aeruginosa NOT DETECTED NOT DETECTED   Candida albicans NOT DETECTED NOT DETECTED   Candida glabrata NOT DETECTED NOT DETECTED   Candida krusei NOT DETECTED NOT DETECTED   Candida parapsilosis NOT DETECTED NOT DETECTED   Candida tropicalis NOT DETECTED NOT DETECTED    [name] 03/25/2019  6:44 AM

## 2019-03-26 LAB — BASIC METABOLIC PANEL
Anion gap: 7 (ref 5–15)
BUN: 18 mg/dL (ref 6–20)
CO2: 39 mmol/L — ABNORMAL HIGH (ref 22–32)
Calcium: 8.8 mg/dL — ABNORMAL LOW (ref 8.9–10.3)
Chloride: 95 mmol/L — ABNORMAL LOW (ref 98–111)
Creatinine, Ser: 0.69 mg/dL (ref 0.44–1.00)
GFR calc Af Amer: >60 mL/min (ref >60)
GFR calc non Af Amer: >60 mL/min (ref >60)
Glucose, Bld: 123 mg/dL — ABNORMAL HIGH (ref 70–99)
Potassium: 5.2 mmol/L — ABNORMAL HIGH (ref 3.5–5.1)
Sodium: 141 mmol/L (ref 135–145)

## 2019-03-26 LAB — LEGIONELLA PNEUMOPHILA SEROGP 1 UR AG: L. pneumophila Serogp 1 Ur Ag: NEGATIVE

## 2019-03-26 LAB — CBC
HCT: 26.7 % — ABNORMAL LOW (ref 36.0–46.0)
Hemoglobin: 8 g/dL — ABNORMAL LOW (ref 12.0–15.0)
MCH: 30.9 pg (ref 26.0–34.0)
MCHC: 30 g/dL (ref 30.0–36.0)
MCV: 103.1 fL — ABNORMAL HIGH (ref 80.0–100.0)
Platelets: 404 10*3/uL — ABNORMAL HIGH (ref 150–400)
RBC: 2.59 MIL/uL — ABNORMAL LOW (ref 3.87–5.11)
RDW: 17.2 % — ABNORMAL HIGH (ref 11.5–15.5)
WBC: 4.3 10*3/uL (ref 4.0–10.5)
nRBC: 0.5 % — ABNORMAL HIGH (ref 0.0–0.2)

## 2019-03-26 LAB — GLUCOSE, CAPILLARY
Glucose-Capillary: 121 mg/dL — ABNORMAL HIGH (ref 70–99)
Glucose-Capillary: 126 mg/dL — ABNORMAL HIGH (ref 70–99)
Glucose-Capillary: 270 mg/dL — ABNORMAL HIGH (ref 70–99)
Glucose-Capillary: 80 mg/dL (ref 70–99)
Glucose-Capillary: 136 mg/dL — ABNORMAL HIGH (ref 70–99)
Glucose-Capillary: 145 mg/dL — ABNORMAL HIGH (ref 70–99)

## 2019-03-26 MED ORDER — IPRATROPIUM-ALBUTEROL 0.5-2.5 (3) MG/3ML IN SOLN
3.0000 mL | Freq: Three times a day (TID) | RESPIRATORY_TRACT | Status: DC
  Administered 2019-03-26 – 2019-03-30 (×11): 3 mL via RESPIRATORY_TRACT
  Filled 2019-03-26 (×11): qty 3

## 2019-03-26 MED ORDER — PREDNISONE 20 MG PO TABS
20.0000 mg | ORAL_TABLET | Freq: Every day | ORAL | Status: DC
  Administered 2019-03-27 – 2019-03-30 (×4): 20 mg via ORAL
  Filled 2019-03-26 (×5): qty 1

## 2019-03-26 NOTE — Progress Notes (Signed)
Pt just back to bed from chair at time of scheduled neb tx, SpO2 88% on 3L [place] and HR 114. Post neb tx (given on RA), rechecked SpO2, currently 95%, HR 96.

## 2019-03-26 NOTE — Progress Notes (Signed)
Inpatient Rehabilitation-Admissions Coordinator   [facility] will contact MD to request an IP Rehab Consult Order.   Jhonnie Garner, OTR/L  Rehab Admissions Coordinator  [phone] 03/26/2019 3:58 PM

## 2019-03-26 NOTE — Progress Notes (Signed)
   03/26/19 2000  Clinical Encounter Type  Visited With Patient  Visit Type Initial;Spiritual support;Psychological support  Referral From Nurse  Consult/Referral To Other (Comment) (Al-Anon)  Spiritual Encounters  Spiritual Needs Grief support;Emotional;Brochure  Stress Factors  Patient Stress Factors Family relationships;Financial concerns;Loss of control;Major life changes;Loss   Pt has many changes and challenges.  She has some struggles w/ meaning and purpose, although she had a strong sense of Christian faith.  Also feels alone and has feelings of guilt around asking family members for transportation help since she stopped driving.  Wanted "someone she could call to talk to sometimes," offered 2 toll-free numbers for support lines.  Struggling w/ setting boundaries/not enabling one offspring.  Reinforced and celebrated multiple steps pt had already made, offered info on Al-anon for support to continue to strengthen those boundaries as pt named that as a goal.  Also, endorsed that such moves were in line with her stated values of loving God and others and in seeking the good for herself and others.  Myra Gianotti resident, [phone]

## 2019-03-26 NOTE — Evaluation (Signed)
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 admitted for acute resp failure req mechanical ventilation.   Clinical Impression   Pt PTA: pt performing own ADL and mobility, independent and living alone. Pt currently limited by severe fatigue and lethargy, poor mobility, decreased ability to care for self and decreased activity tolerance. Pt minA for UB ADL and modA/maxA for LB ADL. Pt requiring assist for LOB episodes in standing today with RW from bed taking 6 steps to recliner. Pt [place] with mobility with RW. Pt unable to progress past 6 steps safely. Pt SpO2 levels >90% on RA. Pt would benefit from continued OT skilled services for ADL, mobility and safety in CIR setting. OT following acutely.  If pt is less fatigued,  I believe pt could be a great rehab candidate for CIR as she was independent prior and now requiring increased assist.    Follow Up Recommendations  CIR;Supervision/Assistance - 24 hour(SNF if not approved for CIR)    Equipment Recommendations  3 in 1 bedside commode    Recommendations for Other Services       Precautions / Restrictions Precautions Precautions: Fall;Other (comment) Precaution Comments: O2 Restrictions Weight Bearing Restrictions: No      Mobility Bed Mobility Overal bed mobility: Needs Assistance Bed Mobility: Supine to Sit     Supine to sit: Mod assist     General bed mobility comments: ModA for trunk elevation, able to move her own BLEs.  Transfers Overall transfer level: Needs assistance Equipment used: Rolling walker (2 wheeled) Transfers: Sit to/from Stand Sit to Stand: Mod assist         General transfer comment: ModA for powerup today. pt reports feeling weaker    Balance Overall balance  assessment: Needs assistance Sitting-balance support: Single extremity supported;Feet supported Sitting balance-Leahy Scale: Fair     Standing balance support: Bilateral upper extremity supported Standing balance-Leahy Scale: Poor Standing balance comment: Pt unable to release from RW and 2 LOB episodes unable to right reaction requiring modA from OTR to correct.                           ADL either performed or assessed with clinical judgement   ADL Overall ADL's : Needs assistance/impaired Eating/Feeding: Minimal assistance;Sitting   Grooming: Minimal assistance;Sitting Grooming Details (indicate cue type and reason): with rest breaks Upper Body Bathing: Minimal assistance;Sitting   Lower Body Bathing: Moderate assistance;Maximal assistance;Sitting/lateral leans;Sit to/from stand   Upper Body Dressing : Minimal assistance;Sitting   Lower Body Dressing: Moderate assistance;Maximal assistance;With adaptive equipment;Sitting/lateral leans;Sit to/from stand   Toilet Transfer: Moderate assistance;Cueing for safety;Cueing for sequencing;Stand-pivot;BSC Toilet Transfer Details (indicate cue type and reason): pt very unsteady for transfer Toileting- Clothing Manipulation and Hygiene: Moderate assistance;Maximal assistance;Cueing for safety;Cueing for sequencing;+2 for physical assistance;+2 for safety/equipment;Sitting/lateral lean;Sit to/from stand       Functional mobility during ADLs: Moderate assistance;Cueing for safety;Cueing for sequencing;Rolling walker General ADL Comments: Pt minA for UB ADL and modA/maxA for LB ADL. Pt requiring assist for LOB episodes in standing today with RW from bed taking 6 steps to recliner.     Vision Baseline Vision/History: No visual deficits Vision Assessment?: No apparent visual deficits     Perception  Praxis      Pertinent Vitals/Pain Pain Assessment: Faces Faces Pain Scale: Hurts a little bit Pain Location: "all  over" Pain Descriptors / Indicators: Discomfort Pain Intervention(s): Limited activity within patient's tolerance     Hand Dominance Right   Extremity/Trunk Assessment Upper Extremity Assessment Upper Extremity Assessment: Generalized weakness;RUE deficits/detail;LUE deficits/detail RUE Deficits / Details: 2/5 MM grade LUE Deficits / Details: 2/5 MM grade   Lower Extremity Assessment Lower Extremity Assessment: Generalized weakness   Cervical / Trunk Assessment Cervical / Trunk Assessment: Kyphotic   Communication Communication Communication: No difficulties   Cognition Arousal/Alertness: Lethargic Behavior During Therapy: WFL for tasks assessed/performed Overall Cognitive Status: Within Functional Limits for tasks assessed                                     General Comments  SpO2 >90% on 2L O2    Exercises     Shoulder Instructions      Home Living Family/patient expects to be discharged to:: Private residence Living Arrangements: Alone Available Help at Discharge: Family;Available PRN/intermittently Type of Home: Apartment Home Access: Stairs to enter Entrance Stairs-Number of Steps: 2 Entrance Stairs-Rails: None Home Layout: One level     Bathroom Shower/Tub: Teacher, early years/pre: Standard     Home Equipment: Environmental consultant - 2 wheels;Cane - single point;Bedside commode;Tub bench          Prior Functioning/Environment Level of Independence: Needs assistance  Gait / Transfers Assistance Needed: independent with gait in house ADL's / Homemaking Assistance Needed: Brother would do shopping for her,. Does not drive.            OT Problem List: Decreased strength;Decreased activity tolerance;Impaired balance (sitting and/or standing);Decreased coordination;Decreased cognition;Decreased safety awareness;Pain      OT Treatment/Interventions: Self-care/ADL training;Neuromuscular education;Therapeutic exercise;Energy conservation;DME  and/or AE instruction;Therapeutic activities;Patient/family education;Balance training    OT Goals(Current goals can be found in the care plan section) Acute Rehab OT Goals Patient Stated Goal: Get stronger before going home OT Goal Formulation: With patient Time For Goal Achievement: 04/09/19 Potential to Achieve Goals: Good ADL Goals Pt Will Perform Grooming: with modified independence;sitting Pt Will Perform Upper Body Dressing: with modified independence;sitting Pt Will Perform Lower Body Dressing: with supervision;sitting/lateral leans;sit to/from stand Pt Will Transfer to Toilet: with set-up;ambulating;regular height toilet Pt Will Perform Toileting - Clothing Manipulation and hygiene: with min assist;sitting/lateral leans;sit to/from stand Pt/caregiver will Perform Home Exercise Program: Increased strength;Both right and left upper extremity;With written HEP provided Additional ADL Goal #1: Pt will perform OOB ADL with modified independence level assist and 1-2 seated rest breaks.  OT Frequency: Min 2X/week   Barriers to D/C: Decreased caregiver support  lives alone       Co-evaluation              AM-PAC OT "6 Clicks" Daily Activity     Outcome Measure Help from another person eating meals?: A Little Help from another person taking care of personal grooming?: A Lot Help from another person toileting, which includes using toliet, bedpan, or urinal?: Total Help from another person bathing (including washing, rinsing, drying)?: A Lot Help from another person to put on and taking off regular upper body clothing?: A Lot Help from another person to put on and taking off regular lower body clothing?: Total 6 Click Score: 11   End of Session Equipment Utilized During Treatment: Gait belt;Rolling walker;Oxygen Nurse  Communication: Mobility status  Activity Tolerance: Treatment limited secondary to medical complications (Comment);Patient limited by lethargy Patient left: in  chair;with call bell/phone within reach;with nursing/sitter in room  OT Visit Diagnosis: Unsteadiness on feet (R26.81);Muscle weakness (generalized) (M62.81);Pain Pain - part of body: ("all over")                Time: 7124-5809 OT Time Calculation (min): 31 min Charges:  OT General Charges $OT Visit: 1 Visit OT Evaluation $OT Eval Moderate Complexity: 1 Mod OT Treatments $Self Care/Home Management : 8-22 mins  Ebony Hail Harold Hedge) Marsa Aris OTR/L Acute Rehabilitation Services Pager: [phone] Office: [place] 03/26/2019, 3:52 PM

## 2019-03-26 NOTE — Progress Notes (Signed)
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  She was found unresponsive on the afternoon of 03/22/2019 and drooling by her daughter, EMS brought patient to the [facility] ER where she was hypoxic.  Hypercarbic with CO2 of 90 and pH 7.2.  BiPAP without improvement.  Patient was ultimately intubated and transferred to [facility] ICU.  She was transiently on norepinephrine.  Patient was ultimately extubated to nasal cannula oxygen on 03/24/2019 and transferred to general medical floor.  She is treated as COPD exacerbation. 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  Assessment & Plan:   Active Problems:   Acute on chronic respiratory failure with hypoxia and hypercapnia (HCC)  Acute on chronic respiratory hypoxic and hypercapnic respiratory failure due to COPD exacerbation: Continue bronchodilator therapy, IV steroids will be changed to oral steroids, inhalational steroids, scheduled and as needed bronchodilators, deep breathing exercises, incentive spirometry, chest physiotherapy. Antibiotics were discontinued. Supplemental oxygen to keep saturations more than 92%. PT and OT.  Positive blood cultures: 1 of the 2 bottles showing gram-positive cocci.  Patient with no evidence of obvious infection.  Probably contaminant.  Will wait until final cultures.  Unresponsiveness: Review of records from [facility] showed multiple ER visits which was evaluated with MRI, CT brain EEG and neurology consultation and was thought to be benzo withdrawal.  CT head this admission is negative.  Patient with no neurological deficits. Patient apparently on long-term Klonopin, verified and  resumed.  She was tried to be tapered off of the benzodiazepine and had seizure.  Patient is very adamant that she will stay on Klonopin and her primary care physician will prescribe it.  Will continue. She is also on Effexor that will be resumed.  History of pulmonary hypertension: On Revatio at home.  Will resume on discharge.  Smoker: On nicotine patch.  Depression anxiety: On Klonopin and Effexor.  Resume.  Deconditioning: Work with PT OT.  Will refer to inpatient therapies.  Discontinue telemetry.  She can be moved to general medical bed.  Work with PT OT.  Anticipate discharge to a skilled nursing facility when available.  DVT prophylaxis: Heparin subcu Code Status: Full code Family Communication: None. Disposition Plan: Unknown at this time.  Anticipate SNF.   Consultants:   PCCM, signed off  Procedures:   None  Antimicrobials:   azithromycin, 03/23/2019-03/24/2019   Subjective: Patient was seen and examined.  No overnight events.  She feels slight tremors exacerbated by albuterol.  No other overnight events.   Objective: Vitals:   03/26/19 0600 03/26/19 0753 03/26/19 0841 03/26/19 1147  BP:  104/66  109/66  Pulse: 87 78  98  Resp: (!) 25 (!) 24  17  Temp:  97.8 F (36.6 C)  97.9 F (36.6 C)  TempSrc:  Oral  Oral  SpO2: 99% 100% 100% 100%  Weight:      Height:        Intake/Output Summary (Last 24 hours) at 03/26/2019 1153 Last data filed at 03/26/2019 0600 Gross per 24 hour  Intake 720 ml  Output 1350 ml  Net -630 ml   Filed Weights   03/23/19 1800 03/24/19 0259  Weight: 44 kg 42.2 kg  Examination:  General exam: Appears calm and comfortable, anxious, chronically sick looking.  Not in any distress.  She is currently on 2 L of oxygen. Respiratory system: Clear to auscultation. Respiratory effort normal.  No added sounds. Cardiovascular system: S1 & S2 heard, RRR. No JVD, murmurs, rubs, gallops or clicks. No pedal edema. Gastrointestinal system:  Abdomen is nondistended, soft and nontender. No organomegaly or masses felt. Normal bowel sounds heard. Central nervous system: Alert and oriented. No focal neurological deficits. Extremities: Symmetric 5 x 5 power. Skin: No rashes, lesions or ulcers Psychiatry: Judgement and insight appear normal. Mood & affect anxious.    Data Reviewed: I have personally reviewed following labs and imaging studies  CBC: Recent Labs  Lab 03/23/19 1911 03/23/19 2008 03/24/19 0356 03/25/19 0504 03/26/19 0704  WBC 8.6  --  7.3 5.1 4.3  HGB 8.1* 8.5* 7.9* 8.1* 8.0*  HCT 26.5* 25.0* 26.0* 26.3* 26.7*  MCV 101.5*  --  100.8* 101.9* 103.1*  PLT 428*  --  438* 411* 527*   Basic Metabolic Panel: Recent Labs  Lab 03/23/19 1738 03/23/19 2008 03/23/19 2215 03/24/19 0356 03/25/19 0504 03/26/19 0704  NA 139 136 136 136 138 141  K 5.3* 4.2 4.1 4.0 4.2 5.2*  CL 94*  --  94* 95* 96* 95*  CO2 37*  --  31 32 35* 39*  GLUCOSE 118*  --  106* 125* 100* 123*  BUN 23*  --  22* 22* 20 18  CREATININE 0.88  --  0.80 0.73 0.59 0.69  CALCIUM 8.7*  --  8.2* 8.4* 8.5* 8.8*  MG 1.9  --   --   --  1.9  --   PHOS 4.2  --   --   --  3.3  --    GFR: Estimated Creatinine Clearance: 51.1 mL/min (by C-G formula based on SCr of 0.69 mg/dL). Liver Function Tests: No results for input(s): AST, ALT, ALKPHOS, BILITOT, PROT, ALBUMIN in the last 168 hours. No results for input(s): LIPASE, AMYLASE in the last 168 hours. No results for input(s): AMMONIA in the last 168 hours. Coagulation Profile: No results for input(s): INR, PROTIME in the last 168 hours. Cardiac Enzymes: No results for input(s): CKTOTAL, CKMB, CKMBINDEX, TROPONINI in the last 168 hours. BNP (last 3 results) No results for input(s): PROBNP in the last 8760 hours. HbA1C: Recent Labs    03/23/19 1738  HGBA1C 5.6   CBG: Recent Labs  Lab 03/25/19 1729 03/25/19 1953 03/26/19 0410 03/26/19 0759 03/26/19 1144  GLUCAP 151* 189* 121* 126* 270*    Lipid Profile: No results for input(s): CHOL, HDL, LDLCALC, TRIG, CHOLHDL, LDLDIRECT in the last 72 hours. Thyroid Function Tests: No results for input(s): TSH, T4TOTAL, FREET4, T3FREE, THYROIDAB in the last 72 hours. Anemia Panel: No results for input(s): VITAMINB12, FOLATE, FERRITIN, TIBC, IRON, RETICCTPCT in the last 72 hours. Sepsis Labs: Recent Labs  Lab 03/23/19 1738 03/23/19 2022  PROCALCITON <0.10  --   LATICACIDVEN 3.2* 1.5    Recent Results (from the past 240 hour(s))  MRSA PCR Screening     Status: None   Collection Time: 03/23/19  5:07 PM   Specimen: Nasal Mucosa; Nasopharyngeal  Result Value Ref Range Status   MRSA by PCR NEGATIVE NEGATIVE Final    Comment:        The GeneXpert MRSA Assay (FDA approved for NASAL specimens only), is one component of a comprehensive MRSA colonization surveillance program. It is not intended to diagnose MRSA infection nor  to guide or monitor treatment for MRSA infections. Performed at [facility], [place] [address], [place], [place] [address]   Culture, blood (routine x 2)     Status: None (Preliminary result)   Collection Time: 03/23/19  7:00 PM   Specimen: BLOOD  Result Value Ref Range Status   Specimen Description BLOOD RIGHT ANTECUBITAL  Final   Special Requests   Final    BOTTLES DRAWN AEROBIC AND ANAEROBIC Blood Culture adequate volume   Culture  Setup Time   Final    GRAM POSITIVE COCCI IN CLUSTERS AEROBIC BOTTLE ONLY CRITICAL RESULT CALLED TO, READ BACK BY AND VERIFIED WITH: Salli Real [id number] 03/25/2019 Mena Goes Performed at [facility], [address], [place], [place] [address]    Culture GRAM POSITIVE COCCI  Final   Report Status PENDING  Incomplete  Blood Culture ID Panel (Reflexed)     Status: None   Collection Time: 03/23/19  7:00 PM  Result Value Ref Range Status   Enterococcus species NOT DETECTED NOT DETECTED Final   Listeria monocytogenes NOT DETECTED NOT DETECTED Final   Staphylococcus  species NOT DETECTED NOT DETECTED Final   Staphylococcus aureus (BCID) NOT DETECTED NOT DETECTED Final   Streptococcus species NOT DETECTED NOT DETECTED Final   Streptococcus agalactiae NOT DETECTED NOT DETECTED Final   Streptococcus pneumoniae NOT DETECTED NOT DETECTED Final   Streptococcus pyogenes NOT DETECTED NOT DETECTED Final   Acinetobacter baumannii NOT DETECTED NOT DETECTED Final   Enterobacteriaceae species NOT DETECTED NOT DETECTED Final   Enterobacter cloacae complex NOT DETECTED NOT DETECTED Final   Escherichia coli NOT DETECTED NOT DETECTED Final   Klebsiella oxytoca NOT DETECTED NOT DETECTED Final   Klebsiella pneumoniae NOT DETECTED NOT DETECTED Final   Proteus species NOT DETECTED NOT DETECTED Final   Serratia marcescens NOT DETECTED NOT DETECTED Final   Haemophilus influenzae NOT DETECTED NOT DETECTED Final   Neisseria meningitidis NOT DETECTED NOT DETECTED Final   Pseudomonas aeruginosa NOT DETECTED NOT DETECTED Final   Candida albicans NOT DETECTED NOT DETECTED Final   Candida glabrata NOT DETECTED NOT DETECTED Final   Candida krusei NOT DETECTED NOT DETECTED Final   Candida parapsilosis NOT DETECTED NOT DETECTED Final   Candida tropicalis NOT DETECTED NOT DETECTED Final    Comment: Performed at [facility], [address], [place], [place] [address]  Culture, blood (routine x 2)     Status: None (Preliminary result)   Collection Time: 03/23/19  7:15 PM   Specimen: BLOOD RIGHT HAND  Result Value Ref Range Status   Specimen Description BLOOD RIGHT HAND  Final   Special Requests   Final    BOTTLES DRAWN AEROBIC ONLY Blood Culture results may not be optimal due to an excessive volume of blood received in culture bottles   Culture   Final    NO GROWTH 3 DAYS Performed at [facility], [place] [address], [place], [place] [address]    Report Status PENDING  Incomplete         Radiology Studies: No results found.      Scheduled Meds: .  Chlorhexidine Gluconate Cloth  6 each Topical Daily  . clonazePAM  2 mg Oral BID  . gabapentin  300 mg Oral TID  . heparin  5,000 Units Subcutaneous Q8H  . ipratropium-albuterol  3 mL Nebulization Q6H  . mouth rinse  15 mL Mouth Rinse BID  . Melatonin  6 mg Oral QHS  .  pantoprazole  40 mg Oral Daily  . [START ON 03/27/2019] predniSONE  20 mg Oral Q breakfast  . sodium chloride flush  10-40 mL Intracatheter Q12H  . venlafaxine XR  75 mg Oral Q breakfast   Continuous Infusions: . sodium chloride Stopped (03/23/19 2354)     LOS: 3 days    Time spent: 25 minutes    Barb Merino, MD [facility] Pager [phone]  If 7PM-7AM, please contact night-coverage [email or web address] Password [email or web address] 03/26/2019, 11:53 AM

## 2019-03-27 ENCOUNTER — Inpatient Hospital Stay (HOSPITAL_COMMUNITY): Payer: Medicare Other

## 2019-03-27 DIAGNOSIS — J90 Pleural effusion, not elsewhere classified: Secondary | ICD-10-CM

## 2019-03-27 LAB — CBC
HCT: 28.9 % — ABNORMAL LOW (ref 36.0–46.0)
Hemoglobin: 8.4 g/dL — ABNORMAL LOW (ref 12.0–15.0)
MCH: 31.2 pg (ref 26.0–34.0)
MCHC: 29.1 g/dL — ABNORMAL LOW (ref 30.0–36.0)
MCV: 107.4 fL — ABNORMAL HIGH (ref 80.0–100.0)
Platelets: 425 10*3/uL — ABNORMAL HIGH (ref 150–400)
RBC: 2.69 MIL/uL — ABNORMAL LOW (ref 3.87–5.11)
RDW: 17.2 % — ABNORMAL HIGH (ref 11.5–15.5)
WBC: 8.7 10*3/uL (ref 4.0–10.5)
nRBC: 0.2 % (ref 0.0–0.2)

## 2019-03-27 LAB — BASIC METABOLIC PANEL
Anion gap: 9 (ref 5–15)
BUN: 17 mg/dL (ref 6–20)
CO2: 44 mmol/L — ABNORMAL HIGH (ref 22–32)
Calcium: 8.6 mg/dL — ABNORMAL LOW (ref 8.9–10.3)
Chloride: 88 mmol/L — ABNORMAL LOW (ref 98–111)
Creatinine, Ser: 0.78 mg/dL (ref 0.44–1.00)
GFR calc Af Amer: >60 mL/min (ref >60)
GFR calc non Af Amer: >60 mL/min (ref >60)
Glucose, Bld: 111 mg/dL — ABNORMAL HIGH (ref 70–99)
Potassium: 4.8 mmol/L (ref 3.5–5.1)
Sodium: 141 mmol/L (ref 135–145)

## 2019-03-27 LAB — GLUCOSE, CAPILLARY
Glucose-Capillary: 107 mg/dL — ABNORMAL HIGH (ref 70–99)
Glucose-Capillary: 116 mg/dL — ABNORMAL HIGH (ref 70–99)
Glucose-Capillary: 123 mg/dL — ABNORMAL HIGH (ref 70–99)
Glucose-Capillary: 126 mg/dL — ABNORMAL HIGH (ref 70–99)
Glucose-Capillary: 191 mg/dL — ABNORMAL HIGH (ref 70–99)

## 2019-03-27 LAB — CULTURE, BLOOD (ROUTINE X 2): Special Requests: ADEQUATE

## 2019-03-27 MED ORDER — FLUTICASONE PROPIONATE 50 MCG/ACT NA SUSP
1.0000 | Freq: Every day | NASAL | Status: DC
  Administered 2019-03-27 – 2019-03-30 (×4): 1 via NASAL
  Filled 2019-03-27: qty 16

## 2019-03-27 MED ORDER — FLUTICASONE FUROATE-VILANTEROL 100-25 MCG/INH IN AEPB
1.0000 | INHALATION_SPRAY | Freq: Every day | RESPIRATORY_TRACT | Status: DC
  Administered 2019-03-29 – 2019-03-30 (×2): 1 via RESPIRATORY_TRACT
  Filled 2019-03-27: qty 28

## 2019-03-27 MED ORDER — FUROSEMIDE 40 MG PO TABS
40.0000 mg | ORAL_TABLET | Freq: Every day | ORAL | Status: DC
  Administered 2019-03-27 – 2019-03-28 (×2): 40 mg via ORAL
  Filled 2019-03-27 (×2): qty 1

## 2019-03-27 NOTE — Progress Notes (Signed)
Inpatient Rehab Admissions:  Inpatient Rehab Consult received.  I met with patient at the bedside for rehabilitation assessment and to discuss goals and expectations of an inpatient rehab admission.  She appears somewhat confused during our discussion.  Initially could not tell me who [name] was, but then states it was her mother and that she is in her 53s, 94s, or 90s.  Will need to confirm dispo before possible rehab admission, also will need to confirm family support given apparent cognitive deficits.  Will follow.   Signed: Shann Medal, PT, DPT Admissions Coordinator [phone] 03/27/19  1:35 PM

## 2019-03-27 NOTE — Progress Notes (Signed)
Attempted x 3 to administer Duoneb HHN.  Pt was eating x 2 and off the floor for the third attempt.

## 2019-03-27 NOTE — Telephone Encounter (Signed)
Checked pt's chart and pt is still currently admitted in the hospital.

## 2019-03-27 NOTE — Progress Notes (Addendum)
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  Continued smoker 2009 [facility] respiratory failure 2 to 3 L oxygen since 2015-pulmonologist worked = restrictive >obstructive components  cardiomyopathy EF 35% Chronic pain lower back status post injections thoracic ESI's followed [facility] Dr. Wilford Grist on opiates Bipolar Admitted by critical care 7/30 from [facility] sedated secondary to unresponsiveness hypercarbic to the 90s pH 7.2  A & P  Hypoxic respiratory failure multifactorial AE COPD versus ILD/Boop Component pulmonary hypertension-notably has EF in the 30s in the past with pseudonormalization this admission 45-50%  progression of lung disease-now prednisone 20, DuoNeb-resume Trelegy twice daily, Flonase  ?  Pneumonia on admission Treated with antibiotics x1 day stopped getting chest x-ray this morning ?  Pulmonary hypertension Resumed Lasix today-check x-ray as above Anxiety/chronic pain Questionable history of BZD withdrawal seizures in the past Only Klonopin at this time-continue gabapentin 300 3 times daily, Effexor 75 daily a.m. Prior nicotine abuse Quit 4 weeks ago-congratulated Transient hyperkalemia Metabolic alkalosis Potassium is better Has currently increase in CO2 secondary to likely respiratory alkalosis May require Diamox-diuresing to see if this will need if no better get urinary anion gap Macrocytic anemia Unclear if she is ever had a colonoscopy-her anemia was not this bad last year and hemoglobin was in the 10 range Transfusion threshold below 7 Check B12 with next labs and folate as well as Mica melanic acid    DVT prophylaxis: Lovenox Code Status: Full Family Communication: No family today Disposition Plan: --- doubt tolerance 3 hours therapy services-asking PT to reassess feels more appropriate for SNF     Verneita Griffes, MD [facility] 7:25 AM  03/27/2019, 7:25 AM  LOS: 4 days   Procedures:  7/30 ett-> 7/30 fem cvc->  Significant Diagnostic Tests:    Micro Data:  7/30 sars2 (from OSH): negative 7/30 blood 7/30 urine 7/30 resp 7/30 rvp 7/30 strep/leg:   Antimicrobials:  7/30 azithro 7/30 doxy     Interval History/Subjective  Awake alert was very difficult to get her out of bed she seems sleepy she has no chest pain Tells me that it took her "a while" to walk around with a wheelchair/walker yesterday She has had a full breakfast   Objective   Vitals:  Vitals:   03/26/19 2116 03/26/19 2331  BP:  102/69  Pulse:  (!) 112  Resp:  16  Temp:  98.8 F (37.1 C)  SpO2: 96% 90%    Exam:  Awake coherent pleasant no distress very frail cachectic appearing Caucasian female no icterus no pallor flat affect Bilateral coarse wheezing bilateral posterolaterally heard all over lung fields S1-S2 no murmur rub or gallop Abdomen is scaphoid soft she has no lower extremity edema Neurologically intact   I have personally reviewed the following:   Today's Data     Lab Data   Potassium 4.8 chloride 88 CO2 44 up from baseline in the 30 range and normal  Anion gap 9  Hemoglobin 8.4 MCV 107 platelets 425  Micro Data   Cultures from 7/30 are negative  Imaging   X-ray pending  Cardiology Data  IMPRESSIONS    1. The left ventricle has mildly reduced systolic function, with an ejection fraction of 45-50%. The cavity size was normal. Left ventricular diastolic Doppler parameters are consistent with pseudonormalization. Left ventrical global hypokinesis without  regional wall motion abnormalities.  2. The right ventricle  has mildly reduced systolic function. The cavity was normal. There is no increase in right ventricular wall thickness. Right ventricular systolic pressure is mildly elevated with an estimated pressure of 43.5 mmHg.  3. Left atrial size was  not assessed.  4. Right atrial size was mildly dilated.  5. Mitral valve regurgitation is moderate by color flow Doppler.  6. The aortic valve has an indeterminate number of cusps. Mild thickening of the aortic valve. Mild calcification of the aortic valve. Aortic valve regurgitation is moderate by color flow Doppler.  7. The inferior vena cava was dilated in size with <50% respiratory variability  Other Data   No  Scheduled Meds:  Chlorhexidine Gluconate Cloth  6 each Topical Daily   clonazePAM  2 mg Oral BID   gabapentin  300 mg Oral TID   heparin  5,000 Units Subcutaneous Q8H   ipratropium-albuterol  3 mL Nebulization TID   mouth rinse  15 mL Mouth Rinse BID   Melatonin  6 mg Oral QHS   pantoprazole  40 mg Oral Daily   predniSONE  20 mg Oral Q breakfast   sodium chloride flush  10-40 mL Intracatheter Q12H   venlafaxine XR  75 mg Oral Q breakfast   Continuous Infusions:  sodium chloride Stopped (03/23/19 2354)    Active Problems:   Acute on chronic respiratory failure with hypoxia and hypercapnia (HCC)   LOS: 4 days   How to contact the [facility] Attending or Consulting provider [place] or covering provider during after hours [place], for this patient?  1. Check the care team in [facility] and look for a) attending/consulting TRH provider listed and b) the [facility] team listed 2. Log into [email or web address] and use [place]'s universal password to access. If you do not have the password, please contact the hospital operator. 3. Locate the [facility] provider you are looking for under [facility] and page to a number that you can be directly reached. 4. If you still have difficulty reaching the provider, please page the [facility] (Director on Call) for the Hospitalists listed on amion for assistance.

## 2019-03-28 ENCOUNTER — Encounter (HOSPITAL_COMMUNITY): Payer: Self-pay

## 2019-03-28 LAB — CBC WITH DIFFERENTIAL/PLATELET
Abs Immature Granulocytes: 0.4 10*3/uL — ABNORMAL HIGH (ref 0.00–0.07)
Basophils Absolute: 0 10*3/uL (ref 0.0–0.1)
Basophils Relative: 0 %
Eosinophils Absolute: 0.1 10*3/uL (ref 0.0–0.5)
Eosinophils Relative: 1 %
HCT: 31.6 % — ABNORMAL LOW (ref 36.0–46.0)
Hemoglobin: 9.3 g/dL — ABNORMAL LOW (ref 12.0–15.0)
Immature Granulocytes: 3 %
Lymphocytes Relative: 35 %
Lymphs Abs: 4.2 10*3/uL — ABNORMAL HIGH (ref 0.7–4.0)
MCH: 30.9 pg (ref 26.0–34.0)
MCHC: 29.4 g/dL — ABNORMAL LOW (ref 30.0–36.0)
MCV: 105 fL — ABNORMAL HIGH (ref 80.0–100.0)
Monocytes Absolute: 1.2 10*3/uL — ABNORMAL HIGH (ref 0.1–1.0)
Monocytes Relative: 10 %
Neutro Abs: 5.8 10*3/uL (ref 1.7–7.7)
Neutrophils Relative %: 51 %
Platelets: 544 10*3/uL — ABNORMAL HIGH (ref 150–400)
RBC: 3.01 MIL/uL — ABNORMAL LOW (ref 3.87–5.11)
RDW: 17 % — ABNORMAL HIGH (ref 11.5–15.5)
WBC: 11.7 10*3/uL — ABNORMAL HIGH (ref 4.0–10.5)
nRBC: 0.4 % — ABNORMAL HIGH (ref 0.0–0.2)

## 2019-03-28 LAB — CULTURE, BLOOD (ROUTINE X 2): Culture: NO GROWTH

## 2019-03-28 LAB — RENAL FUNCTION PANEL
Albumin: 2.6 g/dL — ABNORMAL LOW (ref 3.5–5.0)
Anion gap: 10 (ref 5–15)
BUN: 22 mg/dL — ABNORMAL HIGH (ref 6–20)
CO2: 45 mmol/L — ABNORMAL HIGH (ref 22–32)
Calcium: 8.6 mg/dL — ABNORMAL LOW (ref 8.9–10.3)
Chloride: 82 mmol/L — ABNORMAL LOW (ref 98–111)
Creatinine, Ser: 0.77 mg/dL (ref 0.44–1.00)
GFR calc Af Amer: >60 mL/min (ref >60)
GFR calc non Af Amer: >60 mL/min (ref >60)
Glucose, Bld: 207 mg/dL — ABNORMAL HIGH (ref 70–99)
Phosphorus: 2.4 mg/dL — ABNORMAL LOW (ref 2.5–4.6)
Potassium: 3.9 mmol/L (ref 3.5–5.1)
Sodium: 137 mmol/L (ref 135–145)

## 2019-03-28 LAB — GLUCOSE, CAPILLARY
Glucose-Capillary: 122 mg/dL — ABNORMAL HIGH (ref 70–99)
Glucose-Capillary: 121 mg/dL — ABNORMAL HIGH (ref 70–99)
Glucose-Capillary: 189 mg/dL — ABNORMAL HIGH (ref 70–99)
Glucose-Capillary: 228 mg/dL — ABNORMAL HIGH (ref 70–99)

## 2019-03-28 LAB — NA AND K (SODIUM & POTASSIUM), RAND UR
Potassium Urine: 9 mmol/L
Sodium, Ur: 117 mmol/L

## 2019-03-28 LAB — VITAMIN B12: Vitamin B-12: 325 pg/mL (ref 180–914)

## 2019-03-28 LAB — CHLORIDE, URINE, RANDOM: Chloride Urine: 113 mmol/L

## 2019-03-28 MED ORDER — POLYETHYLENE GLYCOL 3350 17 G PO PACK
17.0000 g | PACK | Freq: Every day | ORAL | Status: DC
  Administered 2019-03-28 – 2019-03-30 (×3): 17 g via ORAL
  Filled 2019-03-28 (×3): qty 1

## 2019-03-28 MED ORDER — FUROSEMIDE 20 MG PO TABS
20.0000 mg | ORAL_TABLET | Freq: Every day | ORAL | Status: DC
  Administered 2019-03-29: 20 mg via ORAL
  Filled 2019-03-28: qty 1

## 2019-03-28 MED ORDER — ACETAZOLAMIDE ER 500 MG PO CP12
500.0000 mg | ORAL_CAPSULE | Freq: Two times a day (BID) | ORAL | Status: DC
  Administered 2019-03-28 – 2019-03-29 (×3): 500 mg via ORAL
  Filled 2019-03-28 (×3): qty 1

## 2019-03-28 NOTE — Care Management Important Message (Signed)
Important Message  Patient Details  Name: Krystal Cantu MRN: [id number] Date of Birth: 12/01/1960   Medicare Important Message Given:  Yes     [name] 03/28/2019, 12:36 PM

## 2019-03-28 NOTE — Care Management (Signed)
CM discussed pt with attending - PT to re-evaluate for CIR appropriateness -noted that pt may need SNF.  CM also discussed the requirement for PASSR level 2 if SNF is deemed appropriate with attending due to new dx of Bipolar and medication regimen to support this dx.

## 2019-03-28 NOTE — Progress Notes (Signed)
Physical Therapy Treatment Patient Details Name: Krystal Cantu MRN: [id number] DOB: Feb 25, 1961 Today's Date: 03/28/2019    History of Present Illness 58 yo female with pmh COPD, h/o BOOP vs ILD, Chronic hypoxic resp failure on 2-3 L at home presented to OSH ([place]) for sob. admitted for acute resp failure req mechanical ventilation 7/30-7/31.      PT Comments    Pt was able to walk a short distance down the hallway with min assist, DOE 3/4, O2 sats 94% on 2 L O2 [place].  She is very weak, deconditioned, and has poor cardiopulmonary reserves.  I do not believe she can handle the intensity of an inpatient rehab setting and am therefore changing my recommendation to SNF.  She has been through SNF rehab program before and did well.  She gained weight and got strong enough to go back home.  PT will continue to follow acutely for safe mobility progression   Follow Up Recommendations  SNF(I do not believe she can handle the intensity of CIR level )     Equipment Recommendations  None recommended by PT    Recommendations for Other Services   NA     Precautions / Restrictions Precautions Precautions: Fall;Other (comment) Precaution Comments: monitor O2    Mobility  Bed Mobility Overal bed mobility: Needs Assistance Bed Mobility: Supine to Sit     Supine to sit: Min guard     General bed mobility comments: Min guard assist to support trunk to ensure balance during transitions.  Pt using rail, HOB mildly elevated  Transfers Overall transfer level: Needs assistance Equipment used: Rolling walker (2 wheeled) Transfers: Sit to/from Stand Sit to Stand: Min assist         General transfer comment: Min assist to stand to RW from EOB.  assist to power up and stabilize once standing.   Ambulation/Gait Ambulation/Gait assistance: Min assist Gait Distance (Feet): 45 Feet Assistive device: Rolling walker (2 wheeled) Gait Pattern/deviations: Step-through pattern;Decreased stride  length;Narrow base of support Gait velocity: decreased Gait velocity interpretation: 1.31 - 2.62 ft/sec, indicative of limited community ambulator General Gait Details: Pt with limited endurance.  Slow, shuffling gait, prolonged standing rest before turning back to her room.  O2 sats stable in the mid 90s on 2 L O2 [place] during mobility, DOE 3/4.            Balance Overall balance assessment: Needs assistance Sitting-balance support: Feet supported;Bilateral upper extremity supported Sitting balance-Leahy Scale: Fair     Standing balance support: Bilateral upper extremity supported Standing balance-Leahy Scale: Poor Standing balance comment: needs external support from RW and therapist.                             Cognition Arousal/Alertness: Awake/alert Behavior During Therapy: WFL for tasks assessed/performed Overall Cognitive Status: Within Functional Limits for tasks assessed                                 General Comments: Pt is frustrated, down about her current medical condition.      Exercises General Exercises - Upper Extremity Shoulder Flexion: AROM;Both;10 reps Elbow Flexion: AROM;Both;10 reps General Exercises - Lower Extremity Ankle Circles/Pumps: AROM;Both;20 reps Long Arc Quad: AROM;Both;10 reps Hip Flexion/Marching: AROM;Both;10 reps        Pertinent Vitals/Pain Pain Assessment: Faces Faces Pain Scale: Hurts a little bit Pain Location: generalized Pain Descriptors /  Indicators: Discomfort Pain Intervention(s): Limited activity within patient's tolerance;Monitored during session;Repositioned           PT Goals (current goals can now be found in the care plan section) Acute Rehab PT Goals Patient Stated Goal: Get stronger before going home Progress towards PT goals: Progressing toward goals    Frequency    Min 2X/week      PT Plan Discharge plan needs to be updated       AM-PAC PT "6 Clicks" Mobility   Outcome  Measure  Help needed turning from your back to your side while in a flat bed without using bedrails?: A Little Help needed moving from lying on your back to sitting on the side of a flat bed without using bedrails?: A Little Help needed moving to and from a bed to a chair (including a wheelchair)?: A Little Help needed standing up from a chair using your arms (e.g., wheelchair or bedside chair)?: A Little Help needed to walk in hospital room?: A Little Help needed climbing 3-5 steps with a railing? : A Lot 6 Click Score: 17    End of Session Equipment Utilized During Treatment: Gait belt;Oxygen Activity Tolerance: Patient limited by fatigue Patient left: in chair;with call bell/phone within reach Nurse Communication: Mobility status PT Visit Diagnosis: Unsteadiness on feet (R26.81);Other abnormalities of gait and mobility (R26.89);History of falling (Z91.81);Difficulty in walking, not elsewhere classified (R26.2);Adult, failure to thrive (R62.7)     Time: 6659-9357 PT Time Calculation (min) (ACUTE ONLY): 41 min  Charges:  $Gait Training: 8-22 mins $Therapeutic Exercise: 8-22 mins $Therapeutic Activity: 8-22 mins                    Kaushik Maul B. Lilliah Priego, PT, DPT  Acute Rehabilitation [phone] pager [phone] office  @ [facility]: [phone]   03/28/2019, 6:35 PM

## 2019-03-28 NOTE — Progress Notes (Signed)
U AG + 13 Suggestive RTA however confounded by lasix-- Started today diamox rechecking am labs If Co2 any higer, would get nephro involved  Verneita Griffes, MD [facility] 4:20 PM

## 2019-03-28 NOTE — Progress Notes (Addendum)
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  Continued smoker 2009 CHR respiratory failure 2 to 3 L oxygen since 2015-pulmonologist worked = restrictive >obstructive components  cardiomyopathy EF 35% Chronic pain lower back status post injections thoracic ESI's followed [facility] Dr. Wilford Grist on opiates Bipolar Ad[facility] by critical care [facility] sedated secondary to unresponsiveness hypercarbic to the 90s pH 7.2  A & P  Hypoxic respiratory failure multifactorial AE COPD versus ILD/Boop Component pulmonary hypertension-notably has EF in the 30s in the past with pseudonormalization this admission 45-50% progression of lung disease-now prednisone 20, DuoNeb-resume Trelegy twice daily, Flonase  ?  Pneumonia on admission Treated with antibiotics x1 day stopped getting chest x-ray this morning Leukocytosis probably from steroids--NO fever curve, non suspicion infection---slowly taper steroids given ILD ?  Pulmonary hypertension Resumed Lasix--cut back dose to 20  anxiety chronic pain ? BZD withdrawal seizures in the past Only Klonopin at this time-continue gabapentin 300 3 times daily, Effexor 75 daily a.m. Prior nicotine abuse Quit 4 weeks ago-congratulated Transient hyperkalemia Metabolic alkalosis Potassium is better Get Urinary AG---no diarr so probably either contraction alkalosis [lasix] vs Met compensation for Chr Resp alkalosis In either event, giving Diamox 500 bid from today Macrocytic anemia Hemogobin tredn up-follow W46, folic acid    DVT prophylaxis: Lovenox Code Status: Full Family Communication: No family today Disposition Plan: --- doubt tolerance 3 hours therapy services-asking PT to reassess feels more appropriate for SNF    Verneita Griffes, MD [facility] 12:20 PM  03/28/2019, 12:20 PM  LOS:  5 days   Procedures:  7/30 ett-> 7/30 fem cvc->  Significant Diagnostic Tests:    Micro Data:  7/30 sars2 (from OSH): negative 7/30 blood 7/30 urine 7/30 resp 7/30 rvp 7/30 strep/leg:   Antimicrobials:  7/30 azithro 7/30 doxy  .   Interval History/Subjective   coherent constipated Some white phlegm otherwise no change   Objective   Vitals:  Vitals:   03/28/19 0753 03/28/19 0753  BP:  117/73  Pulse:  (!) 106  Resp:    Temp:  98.4 F (36.9 C)  SpO2: (!) 89% 100%    Exam:  no distress very frail cachectic appearing Caucasian female--on oxygen no icterus no pallor flat affect kyphotic insp crckles, ? egophony R side posterolat Abdomen is scaphoid soft she has no lower extremity edema Neurologically intact   I have personally reviewed the following:   Today's Data  .   Lab Data  . Potassium 4.8 -->3.9 . chloride 88 -->82  . CO2 45 up from baseline in the 30 range . Anion gap 10 . Phos 2.4, alb 2.6 . Hemoglobin 8.4-->9.3   Micro Data  . Cultures from 7/30 are negative  Imaging  . X-ray showe cardiomeg and worsening fluid status on 8/3  Cardiology Data  IMPRESSIONS    1. The left ventricle has mildly reduced systolic function, with an ejection fraction of 45-50%. The cavity size was normal. Left ventricular diastolic Doppler parameters are consistent with pseudonormalization. Left ventrical global hypokinesis without  regional wall motion abnormalities.  2. The right ventricle has mildly reduced systolic function. The cavity was normal. There is no increase in right ventricular wall thickness. Right ventricular systolic pressure is mildly elevated with an estimated pressure of 43.5 mmHg.  3. Left atrial size was not assessed.  4. Right atrial size was mildly dilated.  5. Mitral valve regurgitation is moderate by color flow Doppler.  6. The aortic valve has an indeterminate number of cusps. Mild thickening of the aortic valve. Mild  calcification of the aortic valve. Aortic valve regurgitation is moderate by color flow Doppler.  7. The inferior vena cava was dilated in size with <50% respiratory variability  Other Data  . No  Scheduled Meds: . acetaZOLAMIDE  500 mg Oral Q12H  . Chlorhexidine Gluconate Cloth  6 each Topical Daily  . clonazePAM  2 mg Oral BID  . fluticasone  1 spray Each Nare Daily  . fluticasone furoate-vilanterol  1 puff Inhalation Daily  . furosemide  40 mg Oral Daily  . gabapentin  300 mg Oral TID  . heparin  5,000 Units Subcutaneous Q8H  . ipratropium-albuterol  3 mL Nebulization TID  . mouth rinse  15 mL Mouth Rinse BID  . Melatonin  6 mg Oral QHS  . pantoprazole  40 mg Oral Daily  . polyethylene glycol  17 g Oral Daily  . predniSONE  20 mg Oral Q breakfast  . venlafaxine XR  75 mg Oral Q breakfast   Continuous Infusions: . sodium chloride Stopped (03/23/19 2354)    Active Problems:   Acute on chronic respiratory failure with hypoxia and hypercapnia (HCC)   LOS: 5 days   How to contact the [facility] Attending or Consulting provider [place] or covering provider during after hours [place], for this patient?  1. Check the care team in [facility] and look for a) attending/consulting TRH provider listed and b) the [facility] team listed 2. Log into [email or web address] and use [place]'s universal password to access. If you do not have the password, please contact the hospital operator. 3. Locate the [facility] provider you are looking for under [facility] and page to a number that you can be directly reached. 4. If you still have difficulty reaching the provider, please page the [facility] (Director on Call) for the Hospitalists listed on amion for assistance.

## 2019-03-29 LAB — CBC WITH DIFFERENTIAL/PLATELET
Abs Immature Granulocytes: 0.74 10*3/uL — ABNORMAL HIGH (ref 0.00–0.07)
Basophils Absolute: 0.1 10*3/uL (ref 0.0–0.1)
Basophils Relative: 1 %
Eosinophils Absolute: 0.1 10*3/uL (ref 0.0–0.5)
Eosinophils Relative: 1 %
HCT: 34.3 % — ABNORMAL LOW (ref 36.0–46.0)
Hemoglobin: 10.1 g/dL — ABNORMAL LOW (ref 12.0–15.0)
Immature Granulocytes: 5 %
Lymphocytes Relative: 33 %
Lymphs Abs: 4.9 10*3/uL — ABNORMAL HIGH (ref 0.7–4.0)
MCH: 30.3 pg (ref 26.0–34.0)
MCHC: 29.4 g/dL — ABNORMAL LOW (ref 30.0–36.0)
MCV: 103 fL — ABNORMAL HIGH (ref 80.0–100.0)
Monocytes Absolute: 1.2 10*3/uL — ABNORMAL HIGH (ref 0.1–1.0)
Monocytes Relative: 8 %
Neutro Abs: 7.7 10*3/uL (ref 1.7–7.7)
Neutrophils Relative %: 52 %
Platelets: 640 10*3/uL — ABNORMAL HIGH (ref 150–400)
RBC: 3.33 MIL/uL — ABNORMAL LOW (ref 3.87–5.11)
RDW: 17.2 % — ABNORMAL HIGH (ref 11.5–15.5)
WBC: 14.8 10*3/uL — ABNORMAL HIGH (ref 4.0–10.5)
nRBC: 0.4 % — ABNORMAL HIGH (ref 0.0–0.2)

## 2019-03-29 LAB — COMPREHENSIVE METABOLIC PANEL
ALT: 17 U/L (ref 0–44)
AST: 13 U/L — ABNORMAL LOW (ref 15–41)
Albumin: 2.9 g/dL — ABNORMAL LOW (ref 3.5–5.0)
Alkaline Phosphatase: 65 U/L (ref 38–126)
Anion gap: 11 (ref 5–15)
BUN: 23 mg/dL — ABNORMAL HIGH (ref 6–20)
CO2: 43 mmol/L — ABNORMAL HIGH (ref 22–32)
Calcium: 9.5 mg/dL (ref 8.9–10.3)
Chloride: 85 mmol/L — ABNORMAL LOW (ref 98–111)
Creatinine, Ser: 0.75 mg/dL (ref 0.44–1.00)
GFR calc Af Amer: >60 mL/min (ref >60)
GFR calc non Af Amer: >60 mL/min (ref >60)
Glucose, Bld: 97 mg/dL (ref 70–99)
Potassium: 4.5 mmol/L (ref 3.5–5.1)
Sodium: 139 mmol/L (ref 135–145)
Total Bilirubin: 0.3 mg/dL (ref 0.3–1.2)
Total Protein: 6.6 g/dL (ref 6.5–8.1)

## 2019-03-29 LAB — GLUCOSE, CAPILLARY
Glucose-Capillary: 100 mg/dL — ABNORMAL HIGH (ref 70–99)
Glucose-Capillary: 112 mg/dL — ABNORMAL HIGH (ref 70–99)
Glucose-Capillary: 150 mg/dL — ABNORMAL HIGH (ref 70–99)
Glucose-Capillary: 156 mg/dL — ABNORMAL HIGH (ref 70–99)
Glucose-Capillary: 175 mg/dL — ABNORMAL HIGH (ref 70–99)
Glucose-Capillary: 251 mg/dL — ABNORMAL HIGH (ref 70–99)

## 2019-03-29 LAB — FOLATE RBC
Folate, Hemolysate: 361 ng/mL
Folate, RBC: 1068 ng/mL (ref >498)
Hematocrit: 33.8 % — ABNORMAL LOW (ref 34.0–46.6)

## 2019-03-29 LAB — SARS CORONAVIRUS 2 BY RT PCR (HOSPITAL ORDER, PERFORMED IN ~~LOC~~ HOSPITAL LAB): SARS Coronavirus 2: NEGATIVE

## 2019-03-29 NOTE — TOC Initial Note (Addendum)
Transition of Care [facility]) - Initial/Assessment Note    Patient Details  Name: Krystal Cantu MRN: [id number] Date of Birth: [birth year]  Transition of Care [facility] [facility] CM/SW Contact:    Maryclare Labrador, RN Phone Number: 03/29/2019, 10:03 AM  Clinical Narrative:        PTA independent from home alone - pt has a friend [name] that assit when needed in the home.  CM explained role to pt - pt A&O during phone assessment.  CM discussed recommendation of SNF with pt - pt informed CM that she is aware of recommendation and  is in agreement with short term SNF placement.  Pt  informed CM that she would prefer [facility] in [place] as she has previously stayed with them.  Pt is on supplemental home oxygen 3 L supplied by [employer].  CM will begin SNF process       Attending note updated ; dx of bipolar removed and replaced with anxiety.  CM discussed case with TOC Director - original PASSR can be utilized for pt  Update:  Olivia Mackie with [facility] has offered pt a bed - awaiting COVID test results  Expected Discharge Plan: [place] Barriers to Discharge: Continued Medical Work up   Patient Goals and CMS Choice Patient states their goals for this hospitalization and ongoing recovery are:: Pt states she is ready to get back home alone CMS Medicare.gov Compare Post Acute Care list provided to:: Patient Choice offered to / list presented to : Patient  Expected Discharge Plan and Services Expected Discharge Plan: [place] Choice: [place] Living arrangements for the past 2 months: Single Family Home                                      Prior Living Arrangements/Services Living arrangements for the past 2 months: Single Family Home Lives with:: Self Patient language and need for interpreter reviewed:: Yes Do you feel safe going back to the place where you live?: No   Pt acknowledges that she needs to go to shorterm rehab  Need for  Family Participation in Patient Care: Yes (Comment) Care giver support system in place?: Yes (comment)(Pt informed CM that her friend [name] can help her once she returns home) Current home services: DME(walker, cane, 3:1 and tub chair) Criminal Activity/Legal Involvement Pertinent to Current Situation/Hospitalization: No - Comment as needed  Activities of Daily Living Home Assistive Devices/Equipment: None ADL Screening (condition at time of admission) Patient's cognitive ability adequate to safely complete daily activities?: Yes Is the patient deaf or have difficulty hearing?: No Does the patient have difficulty seeing, even when wearing glasses/contacts?: No Does the patient have difficulty concentrating, remembering, or making decisions?: Yes Patient able to express need for assistance with ADLs?: Yes Does the patient have difficulty dressing or bathing?: No Independently performs ADLs?: Yes (appropriate for developmental age) Does the patient have difficulty walking or climbing stairs?: No Weakness of Legs: Both Weakness of Arms/Hands: None  Permission Sought/Granted Permission sought to share information with : Case Manager, Chartered certified accountant granted to share information with : Yes, Verbal Permission Granted     Permission granted to share info w AGENCY: local SNF's in the [place] area  Permission granted to share info w Relationship: Pt declined for information to be shared with family/friend  Permission granted to share info w Contact Information: Pt  declined  Emotional Assessment   Attitude/Demeanor/Rapport: Self-Confident, Engaged Affect (typically observed): Accepting, Adaptable Orientation: : Oriented to Self, Oriented to Place, Oriented to  Time, Oriented to Situation   Psych Involvement: No (comment)  Admission diagnosis:  RESPIRATORY FAILURE Patient Active Problem List   Diagnosis Date Noted  . Acute on chronic respiratory failure with  hypoxia and hypercapnia ([place]) 03/23/2019  . ILD (interstitial lung disease) ([place]) 10/15/2017  . Chronic respiratory failure with hypoxia and hypercapnia ([place]) 10/15/2017  . Chronic low back pain 10/11/2017  . Cardiomyopathy ([place]) 10/11/2017  . Fibromyalgia 09/21/2017  . Thoracic spondylosis without myelopathy 09/21/2017  . Degenerative disc disease, lumbar 09/15/2012  . GERD 11/11/2009  . NEVUS 10/07/2009  . GENERALIZED ANXIETY DISORDER 09/26/2009  . CONSTIPATION 09/26/2009  . IBS 09/26/2009  . BLIND LOOP SYNDROME 09/26/2009  . ABDOMINAL DISTENSION 09/06/2009  . Cigarette smoker 02/27/2009  . COPD GOLD 0 02/27/2009  . OTH SPEC ALVEOL&PARIETOALVEOL PNEUMONOPATHIES 02/27/2009   PCP:  Irven Shelling, MD Pharmacy:   [place], [place] - [place] [address] Phone: [phone] Fax: [phone]     Social Determinants of Health (SDOH) Interventions    Readmission Risk Interventions No flowsheet data found.

## 2019-03-29 NOTE — Progress Notes (Addendum)
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  She was found unresponsive on the afternoon of 03/22/2019 and drooling by her daughter, EMS brought patient to the [facility] ER where she was hypoxic.  Hypercarbic with CO2 of 90 and pH 7.2.  BiPAP without improvement.  Patient was ultimately intubated and transferred to [facility] ICU.  She was transiently on norepinephrine.  Patient was ultimately extubated to nasal cannula oxygen on 03/24/2019 and transferred to general medical floor.  She is treated as COPD exacerbation. 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  Assessment & Plan:  Acute on chronic respiratory hypoxic and hypercapnic respiratory failure due to COPD exacerbation: -Status post ventilator dependent respiratory failure -Extubated on 7/31 -Clinically improving, continue prednisone taper, duo nebs -Incentive spirometry -Antibiotics completed and discontinued -Wean O2 down to 3 L which is her baseline -Discharge planning  Positive blood cultures:  -1 of the 2 bottles grew coagulase-negative Staphylococcus which is consistent with contamination   Metabolic encephalopathy, unresponsiveness: Review of records from [facility] showed multiple ER visits which was evaluated with MRI, CT brain EEG and neurology consultation and was thought to be benzo withdrawal.   -CT head this admission is negative.  Patient with no neurological deficits. -She was definitely hypercarbic on admission with PCO2 of 90 which was contributing -Per chart review previous episodes of decreased responsiveness have been suspected to be from benzodiazepine withdrawal seizure -Patient is  on high-dose Klonopin 2 mg twice daily at baseline which was resumed, advised slow taper on this down to a more acceptable dose -Resolved, mental status back to normal  COPD/chronic respiratory failure -On 3 L home O2 at baseline, see discussion above  History of pulmonary hypertension: - On Revatio at home, resume this at discharge  Chronic systolic and diastolic CHF -EF is 45 to 59% with diastolic dysfunction -Continue oral Lasix  Smoker: On nicotine patch.  Anxiety, depression -Resumed Klonopin and Effexor  Deconditioning:  -PT OT evaluation completed, SNF recommended for rehabilitation  DVT prophylaxis: Heparin subcu Code Status: Full code Family Communication: No family at bedside Disposition Plan: SNF when bed available  Consultants:   PCCM, signed off  Procedures:   None  Antimicrobials:   azithromycin, 03/23/2019-03/24/2019   Subjective: -Feels okay, no events overnight, breathing is improved and close to baseline  Objective: Vitals:   03/29/19 0500 03/29/19 0723 03/29/19 0736 03/29/19 0740  BP:  105/77    Pulse:  93    Resp:  20    Temp:  97.7 F (36.5 C)    TempSrc:  Axillary    SpO2:  100% 98% 99%  Weight: 43.7 kg     Height:        Intake/Output Summary (Last 24 hours) at 03/29/2019 1126 Last data filed at 03/29/2019 1100 Gross per 24 hour  Intake 240 ml  Output 1050 ml  Net -810 ml   Filed Weights   03/28/19 0623 03/28/19 1600 03/29/19 0500  Weight: 42.7 kg 40.2 kg 43.7 kg    Examination:  General exam: Appears calm and comfortable, anxious, chronically sick looking.  Not in any distress.  She is currently on 2 L of oxygen. Respiratory system: Clear to auscultation.  Respiratory effort normal.  No added sounds. Cardiovascular system: S1 & S2 heard, RRR. No JVD, murmurs, rubs, gallops or clicks. No pedal edema. Gastrointestinal system: Abdomen is nondistended, soft and nontender. No organomegaly or masses felt. Normal bowel sounds heard.  Central nervous system: Alert and oriented. No focal neurological deficits. Extremities: Symmetric 5 x 5 power. Skin: No rashes, lesions or ulcers Psychiatry: Judgement and insight appear normal. Mood & affect anxious.    Data Reviewed: I have personally reviewed following labs and imaging studies  CBC: Recent Labs  Lab 03/25/19 0504 03/26/19 0704 03/27/19 0538 03/28/19 0914 03/29/19 0541  WBC 5.1 4.3 8.7 11.7* 14.8*  NEUTROABS  --   --   --  5.8 7.7  HGB 8.1* 8.0* 8.4* 9.3* 10.1*  HCT 26.3* 26.7* 28.9* 31.6* 34.3*  MCV 101.9* 103.1* 107.4* 105.0* 103.0*  PLT 411* 404* 425* 544* 937*   Basic Metabolic Panel: Recent Labs  Lab 03/23/19 1738  03/25/19 0504 03/26/19 0704 03/27/19 0538 03/28/19 0914 03/29/19 0541  NA 139   < > 138 141 141 137 139  K 5.3*   < > 4.2 5.2* 4.8 3.9 4.5  CL 94*   < > 96* 95* 88* 82* 85*  CO2 37*   < > 35* 39* 44* 45* 43*  GLUCOSE 118*   < > 100* 123* 111* 207* 97  BUN 23*   < > 20 18 17  22* 23*  CREATININE 0.88   < > 0.59 0.69 0.78 0.77 0.75  CALCIUM 8.7*   < > 8.5* 8.8* 8.6* 8.6* 9.5  MG 1.9  --  1.9  --   --   --   --   PHOS 4.2  --  3.3  --   --  2.4*  --    < > = values in this interval not displayed.   GFR: Estimated Creatinine Clearance: 52.9 mL/min (by C-G formula based on SCr of 0.75 mg/dL). Liver Function Tests: Recent Labs  Lab 03/28/19 0914 03/29/19 0541  AST  --  13*  ALT  --  17  ALKPHOS  --  65  BILITOT  --  0.3  PROT  --  6.6  ALBUMIN 2.6* 2.9*   No results for input(s): LIPASE, AMYLASE in the last 168 hours. No results for input(s): AMMONIA in the last 168 hours. Coagulation Profile: No results for input(s): INR, PROTIME in the last 168 hours. Cardiac Enzymes: No results for input(s): CKTOTAL, CKMB, CKMBINDEX, TROPONINI in the last 168 hours. BNP (last 3 results) No results for input(s): PROBNP in the last 8760 hours. HbA1C: No results for input(s): HGBA1C in the last 72 hours. CBG: Recent Labs  Lab  03/28/19 1121 03/28/19 1936 03/28/19 2323 03/29/19 0403 03/29/19 0720  GLUCAP 121* 189* 228* 175* 100*   Lipid Profile: No results for input(s): CHOL, HDL, LDLCALC, TRIG, CHOLHDL, LDLDIRECT in the last 72 hours. Thyroid Function Tests: No results for input(s): TSH, T4TOTAL, FREET4, T3FREE, THYROIDAB in the last 72 hours. Anemia Panel: Recent Labs    03/28/19 1221  VITAMINB12 325   Sepsis Labs: Recent Labs  Lab 03/23/19 1738 03/23/19 2022  PROCALCITON <0.10  --   LATICACIDVEN 3.2* 1.5    Recent Results (from the past 240 hour(s))  MRSA PCR Screening     Status: None   Collection Time: 03/23/19  5:07 PM   Specimen: Nasal Mucosa; Nasopharyngeal  Result Value Ref Range Status   MRSA by PCR NEGATIVE NEGATIVE Final    Comment:  The GeneXpert MRSA Assay (FDA approved for NASAL specimens only), is one component of a comprehensive MRSA colonization surveillance program. It is not intended to diagnose MRSA infection nor to guide or monitor treatment for MRSA infections. Performed at [facility], [place] [address] [place], [place] [address]   Culture, blood (routine x 2)     Status: Abnormal   Collection Time: 03/23/19  7:00 PM   Specimen: BLOOD  Result Value Ref Range Status   Specimen Description BLOOD RIGHT ANTECUBITAL  Final   Special Requests   Final    BOTTLES DRAWN AEROBIC AND ANAEROBIC Blood Culture adequate volume   Culture  Setup Time   Final    GRAM POSITIVE COCCI IN CLUSTERS AEROBIC BOTTLE ONLY CRITICAL RESULT CALLED TO, READ BACK BY AND VERIFIED WITH: G. ABBOTT,PHARMD 0629 03/25/2019 T. TYSOR    Culture (A)  Final    STAPHYLOCOCCUS SPECIES (COAGULASE NEGATIVE) THE SIGNIFICANCE OF ISOLATING THIS ORGANISM FROM A SINGLE SET OF BLOOD CULTURES WHEN MULTIPLE SETS ARE DRAWN IS UNCERTAIN. PLEASE NOTIFY THE MICROBIOLOGY DEPARTMENT WITHIN ONE WEEK IF SPECIATION AND SENSITIVITIES ARE REQUIRED. Performed at [facility], [place] [address],  [place], [place] [address]    Report Status 03/27/2019 FINAL  Final  Blood Culture ID Panel (Reflexed)     Status: None   Collection Time: 03/23/19  7:00 PM  Result Value Ref Range Status   Enterococcus species NOT DETECTED NOT DETECTED Final   Listeria monocytogenes NOT DETECTED NOT DETECTED Final   Staphylococcus species NOT DETECTED NOT DETECTED Final   Staphylococcus aureus (BCID) NOT DETECTED NOT DETECTED Final   Streptococcus species NOT DETECTED NOT DETECTED Final   Streptococcus agalactiae NOT DETECTED NOT DETECTED Final   Streptococcus pneumoniae NOT DETECTED NOT DETECTED Final   Streptococcus pyogenes NOT DETECTED NOT DETECTED Final   Acinetobacter baumannii NOT DETECTED NOT DETECTED Final   Enterobacteriaceae species NOT DETECTED NOT DETECTED Final   Enterobacter cloacae complex NOT DETECTED NOT DETECTED Final   Escherichia coli NOT DETECTED NOT DETECTED Final   Klebsiella oxytoca NOT DETECTED NOT DETECTED Final   Klebsiella pneumoniae NOT DETECTED NOT DETECTED Final   Proteus species NOT DETECTED NOT DETECTED Final   Serratia marcescens NOT DETECTED NOT DETECTED Final   Haemophilus influenzae NOT DETECTED NOT DETECTED Final   Neisseria meningitidis NOT DETECTED NOT DETECTED Final   Pseudomonas aeruginosa NOT DETECTED NOT DETECTED Final   Candida albicans NOT DETECTED NOT DETECTED Final   Candida glabrata NOT DETECTED NOT DETECTED Final   Candida krusei NOT DETECTED NOT DETECTED Final   Candida parapsilosis NOT DETECTED NOT DETECTED Final   Candida tropicalis NOT DETECTED NOT DETECTED Final    Comment: Performed at [facility], [address], [place], [place] [address]  Culture, blood (routine x 2)     Status: None   Collection Time: 03/23/19  7:15 PM   Specimen: BLOOD RIGHT HAND  Result Value Ref Range Status   Specimen Description BLOOD RIGHT HAND  Final   Special Requests   Final    BOTTLES DRAWN AEROBIC ONLY Blood Culture results may not be optimal due to an  excessive volume of blood received in culture bottles   Culture   Final    NO GROWTH 5 DAYS Performed at [facility], [place] [address], [place], [place] [address]    Report Status 03/28/2019 FINAL  Final         Radiology Studies: No results found.  Scheduled Meds: . acetaZOLAMIDE  500 mg Oral Q12H  . Chlorhexidine Gluconate Cloth  6 each Topical Daily  . clonazePAM  2 mg Oral BID  . fluticasone  1 spray Each Nare Daily  . fluticasone furoate-vilanterol  1 puff Inhalation Daily  . furosemide  20 mg Oral Daily  . gabapentin  300 mg Oral TID  . heparin  5,000 Units Subcutaneous Q8H  . ipratropium-albuterol  3 mL Nebulization TID  . mouth rinse  15 mL Mouth Rinse BID  . Melatonin  6 mg Oral QHS  . pantoprazole  40 mg Oral Daily  . polyethylene glycol  17 g Oral Daily  . predniSONE  20 mg Oral Q breakfast  . venlafaxine XR  75 mg Oral Q breakfast   Continuous Infusions: . sodium chloride Stopped (03/23/19 2354)     LOS: 6 days    Time spent: 35 minutes    Domenic Polite, MD [facility]  03/29/2019, 11:26 AM

## 2019-03-29 NOTE — NC FL2 (Addendum)
[place] LEVEL OF CARE SCREENING TOOL     IDENTIFICATION  Patient Name: Krystal Cantu Birthdate: 1961-06-27 Sex: female Admission Date (Current Location): 03/23/2019  [facility] and [place] Number:  Herbalist and Address:  [facility], [place] [address] [place], [place] [address]      Provider Number: [id number]  Attending Physician Name and Address:  Domenic Polite, MD  Relative Name and Phone Number:       Current Level of Care: Hospital Recommended Level of Care: [place] Prior Approval Number:    Date Approved/Denied:   PASRR Number: [id number]  Discharge Plan: SNF    Current Diagnoses: Patient Active Problem List   Diagnosis Date Noted  . Acute on chronic respiratory failure with hypoxia and hypercapnia ([place]) 03/23/2019  . ILD (interstitial lung disease) ([place]) 10/15/2017  . Chronic respiratory failure with hypoxia and hypercapnia ([place]) 10/15/2017  . Chronic low back pain 10/11/2017  . Cardiomyopathy ([place]) 10/11/2017  . Fibromyalgia 09/21/2017  . Thoracic spondylosis without myelopathy 09/21/2017  . Degenerative disc disease, lumbar 09/15/2012  . GERD 11/11/2009  . NEVUS 10/07/2009  . GENERALIZED ANXIETY DISORDER 09/26/2009  . CONSTIPATION 09/26/2009  . IBS 09/26/2009  . BLIND LOOP SYNDROME 09/26/2009  . ABDOMINAL DISTENSION 09/06/2009  . Cigarette smoker 02/27/2009  . COPD GOLD 0 02/27/2009  . OTH SPEC ALVEOL&PARIETOALVEOL PNEUMONOPATHIES 02/27/2009    Orientation RESPIRATION BLADDER Height & Weight     Self, Place, Time  O2(baseline of 3 liters home oxygen) Continent Weight: 43.7 kg Height:  5' 4.5" (163.8 cm)  BEHAVIORAL SYMPTOMS/MOOD NEUROLOGICAL BOWEL NUTRITION STATUS      Continent Diet(Diet regular Room service appropriate? Yes; Fluid consistency: Thin)  AMBULATORY STATUS COMMUNICATION OF NEEDS Skin   Limited Assist Verbally Other (Comment)(Ecchymosis on both arms)                    Personal Care Assistance Level of Assistance  Bathing, Feeding, Dressing Bathing Assistance: Limited assistance Feeding assistance: Limited assistance Dressing Assistance: Limited assistance     Functional Limitations [place]  PT (By licensed PT), OT (By licensed OT)     PT Frequency: PT x 5 times a week OT Frequency: OT x 5 times a week            Contractures Contractures Info: Not present    Additional Factors Info  Code Status, Allergies, Psychotropic(Pt on Klonopin, Xanax and Effexor - dx of general anxiety disorder) Code Status Info: FULL Allergies Info: Ceftriaxone, Pregabalin, Codeine, Itraconazole, Sulfamethoxazole Psychotropic Info: Klonopin, Effexor, Xanax  dx of generalized anxiety and bipolar         Current Medications (03/29/2019):  This is the current hospital active medication list Current Facility-Administered Medications  Medication Dose Route Frequency Provider Last Rate Last Dose  . 0.9 %  sodium chloride infusion   Intravenous PRN Audria Nine, DO   Stopped at 03/23/19 2354  . acetaminophen (TYLENOL) tablet 650 mg  650 mg Oral Q4H PRN Barb Merino, MD      . acetaZOLAMIDE (DIAMOX) 12 hr capsule 500 mg  500 mg Oral Q12H Nita Sells, MD   500 mg at 03/29/19 0937  . Chlorhexidine Gluconate Cloth 2 % PADS 6 each  6 each Topical Daily Audria Nine, DO   6 each at 03/29/19 [phone]  . clonazePAM (KLONOPIN) tablet 2 mg  2 mg Oral  BID Barb Merino, MD   2 mg at 03/29/19 3762  . fluticasone (FLONASE) 50 MCG/ACT nasal spray 1 spray  1 spray Each Nare Daily Nita Sells, MD   1 spray at 03/29/19 0937  . fluticasone furoate-vilanterol (BREO ELLIPTA) 100-25 MCG/INH 1 puff  1 puff Inhalation Daily Nita Sells, MD   1 puff at 03/29/19 0737  . furosemide (LASIX) tablet 20 mg  20 mg Oral Daily Nita Sells, MD   20 mg at 03/29/19 8315  . gabapentin (NEURONTIN) capsule 300 mg   300 mg Oral TID Barb Merino, MD   300 mg at 03/29/19 0937  . heparin injection 5,000 Units  5,000 Units Subcutaneous Q8H Alfonzo Feller, NP   5,000 Units at 03/29/19 [phone]  . ipratropium-albuterol (DUONEB) 0.5-2.5 (3) MG/3ML nebulizer solution 3 mL  3 mL Nebulization TID Barb Merino, MD   3 mL at 03/29/19 0735  . MEDLINE mouth rinse  15 mL Mouth Rinse BID Rush Farmer, MD   15 mL at 03/29/19 6073  . Melatonin TABS 6 mg  6 mg Oral QHS Barb Merino, MD   6 mg at 03/28/19 2213  . ondansetron (ZOFRAN) injection 4 mg  4 mg Intravenous Q6H PRN Alfonzo Feller, NP   4 mg at 03/25/19 2003  . pantoprazole (PROTONIX) EC tablet 40 mg  40 mg Oral Daily Alfonzo Feller, NP   40 mg at 03/29/19 7106  . polyethylene glycol (MIRALAX / GLYCOLAX) packet 17 g  17 g Oral Daily Nita Sells, MD   17 g at 03/29/19 0936  . predniSONE (DELTASONE) tablet 20 mg  20 mg Oral Q breakfast Barb Merino, MD   20 mg at 03/29/19 0937  . sodium chloride flush (NS) 0.9 % injection 10-40 mL  10-40 mL Intracatheter PRN Audria Nine, DO      . venlafaxine XR (EFFEXOR-XR) 24 hr capsule 75 mg  75 mg Oral Q breakfast Barb Merino, MD   75 mg at 03/29/19 2694     Discharge Medications: Please see discharge summary for a list of discharge medications.  Relevant Imaging Results:  Relevant Lab Results:   Additional Information Social Security Number [id number]  Maryclare Labrador, RN

## 2019-03-30 LAB — CBC WITH DIFFERENTIAL/PLATELET
Abs Immature Granulocytes: 0.6 10*3/uL — ABNORMAL HIGH (ref 0.00–0.07)
Basophils Absolute: 0.1 10*3/uL (ref 0.0–0.1)
Basophils Relative: 1 %
Eosinophils Absolute: 0 10*3/uL (ref 0.0–0.5)
Eosinophils Relative: 0 %
HCT: 35.5 % — ABNORMAL LOW (ref 36.0–46.0)
Hemoglobin: 10.4 g/dL — ABNORMAL LOW (ref 12.0–15.0)
Immature Granulocytes: 5 %
Lymphocytes Relative: 20 %
Lymphs Abs: 2.6 10*3/uL (ref 0.7–4.0)
MCH: 30.3 pg (ref 26.0–34.0)
MCHC: 29.3 g/dL — ABNORMAL LOW (ref 30.0–36.0)
MCV: 103.5 fL — ABNORMAL HIGH (ref 80.0–100.0)
Monocytes Absolute: 0.8 10*3/uL (ref 0.1–1.0)
Monocytes Relative: 6 %
Neutro Abs: 8.9 10*3/uL — ABNORMAL HIGH (ref 1.7–7.7)
Neutrophils Relative %: 68 %
Platelets: 714 10*3/uL — ABNORMAL HIGH (ref 150–400)
RBC: 3.43 MIL/uL — ABNORMAL LOW (ref 3.87–5.11)
RDW: 17 % — ABNORMAL HIGH (ref 11.5–15.5)
WBC: 12.9 10*3/uL — ABNORMAL HIGH (ref 4.0–10.5)
nRBC: 0.2 % (ref 0.0–0.2)

## 2019-03-30 LAB — COMPREHENSIVE METABOLIC PANEL
ALT: 17 U/L (ref 0–44)
AST: 13 U/L — ABNORMAL LOW (ref 15–41)
Albumin: 3.1 g/dL — ABNORMAL LOW (ref 3.5–5.0)
Alkaline Phosphatase: 59 U/L (ref 38–126)
Anion gap: 11 (ref 5–15)
BUN: 33 mg/dL — ABNORMAL HIGH (ref 6–20)
CO2: 37 mmol/L — ABNORMAL HIGH (ref 22–32)
Calcium: 9.3 mg/dL (ref 8.9–10.3)
Chloride: 87 mmol/L — ABNORMAL LOW (ref 98–111)
Creatinine, Ser: 1.08 mg/dL — ABNORMAL HIGH (ref 0.44–1.00)
GFR calc Af Amer: >60 mL/min (ref >60)
GFR calc non Af Amer: 57 mL/min — ABNORMAL LOW (ref >60)
Glucose, Bld: 157 mg/dL — ABNORMAL HIGH (ref 70–99)
Potassium: 3.9 mmol/L (ref 3.5–5.1)
Sodium: 135 mmol/L (ref 135–145)
Total Bilirubin: 0.2 mg/dL — ABNORMAL LOW (ref 0.3–1.2)
Total Protein: 7.1 g/dL (ref 6.5–8.1)

## 2019-03-30 LAB — GLUCOSE, CAPILLARY
Glucose-Capillary: 151 mg/dL — ABNORMAL HIGH (ref 70–99)
Glucose-Capillary: 103 mg/dL — ABNORMAL HIGH (ref 70–99)
Glucose-Capillary: 141 mg/dL — ABNORMAL HIGH (ref 70–99)
Glucose-Capillary: 152 mg/dL — ABNORMAL HIGH (ref 70–99)

## 2019-03-30 MED ORDER — ACETAMINOPHEN 325 MG PO TABS: 650.0000 mg | ORAL_TABLET | ORAL | Status: AC | PRN

## 2019-03-30 MED ORDER — SENNOSIDES-DOCUSATE SODIUM 8.6-50 MG PO TABS
1.0000 | ORAL_TABLET | Freq: Two times a day (BID) | ORAL | Status: DC
  Filled 2019-03-30 (×2): qty 1

## 2019-03-30 MED ORDER — CLONAZEPAM 2 MG PO TABS: 1.0000 mg | ORAL_TABLET | Freq: Two times a day (BID) | ORAL | 0 refills | Status: DC

## 2019-03-30 MED ORDER — FUROSEMIDE 20 MG PO TABS: 20.0000 mg | ORAL_TABLET | Freq: Every day | ORAL | Status: AC

## 2019-03-30 MED ORDER — SENNOSIDES-DOCUSATE SODIUM 8.6-50 MG PO TABS
1.0000 | ORAL_TABLET | Freq: Once | ORAL | Status: AC
  Administered 2019-03-30: 1 via ORAL

## 2019-03-30 MED ORDER — PREDNISONE 20 MG PO TABS: 20.0000 mg | ORAL_TABLET | Freq: Every day | ORAL | 0 refills | Status: AC

## 2019-03-30 NOTE — TOC Transition Note (Addendum)
Transition of Care [facility]) - CM/SW Discharge Note   Patient Details  Name: Krystal Cantu MRN: [id number] Date of Birth: 05/06/1961  [facility] CM/SW Contact:  Maryclare Labrador, RN Phone Number: 03/30/2019, 11:50 AM   Clinical Narrative:   CM received confirmation from [facility] in [place] that pt has insurance auth for admittance today.  CM informed pt that she will transfer to [facility] today - pt remains in agreement.  Pt will transfer to facility by way of [facility].  CM will provide bedside nurse transport pkg and nurse will call [facility] when appropriate  [facility] information Pts room number will be [phone]  Report to be called to : [phone]  Ext [phone] - nurse will be [name]  Update:  Pt requests that CM contact her mom [name] and inform that she is transporting to [facility] today and request that her mom bring clothes to facility - Pts mom informed CM that she appreciates the call and will take pts clothes to facility.  No other CM needs determined - CM signing off    Final next level of care: [place] Barriers to Discharge: Barriers Resolved   Patient Goals and CMS Choice Patient states their goals for this hospitalization and ongoing recovery are:: Pt states she is ready to get back home alone CMS Medicare.gov Compare Post Acute Care list provided to:: Patient Choice offered to / list presented to : Patient  Discharge Placement              Patient chooses bed at: [facility], [place] Patient to be transferred to facility by: [place] Name of family member notified: Pt declined to notify family/friends of transfer    Discharge Plan and Services     Post Acute Care Choice: [place]                               Social Determinants of Health (SDOH) Interventions     Readmission Risk Interventions No flowsheet data found.

## 2019-03-30 NOTE — Progress Notes (Signed)
Physical Therapy Treatment Patient Details Name: Krystal Cantu MRN: [id number] DOB: 11-Mar-1961 Today's Date: 03/30/2019    History of Present Illness 58 yo female with pmh COPD, h/o BOOP vs ILD, Chronic hypoxic resp failure on 2-3 L at home presented to OSH ([facility]) for sob. admitted for acute resp failure req mechanical ventilation 7/30-7/31.      PT Comments    Pt with brighter affect/mood today, hopeful for rehab and to get stronger.  She was able to walk further with RW (almost double last session) with one standing rest break.  VSS on 3 L O2 [place] during gait.  PT will continue to follow acutely for safe mobility progression  Follow Up Recommendations  SNF     Equipment Recommendations  None recommended by PT    Recommendations for Other Services   NA     Precautions / Restrictions Precautions Precautions: Fall;Other (comment) Precaution Comments: monitor O2    Mobility  Bed Mobility Overal bed mobility: Modified Independent                Transfers Overall transfer level: Needs assistance Equipment used: Rolling walker (2 wheeled) Transfers: Sit to/from Stand Sit to Stand: Min guard         General transfer comment: Min guard assist for safety during transitions, cues for safe hand placement.   Ambulation/Gait Ambulation/Gait assistance: Min guard Gait Distance (Feet): 110 Feet Assistive device: Rolling walker (2 wheeled) Gait Pattern/deviations: Step-through pattern;Trunk flexed;Shuffle Gait velocity: decreased Gait velocity interpretation: 1.31 - 2.62 ft/sec, indicative of limited community ambulator General Gait Details: Pt with slow, shuffling gait, on 3 L O2 [place] during gait with O2 sats stable and HR stable throughout.  She was able to go further today than last session , but still required one standing rest break half way.        Balance Overall balance assessment: Needs assistance Sitting-balance support: Feet supported;Bilateral upper  extremity supported Sitting balance-Leahy Scale: Good     Standing balance support: Bilateral upper extremity supported Standing balance-Leahy Scale: Poor Standing balance comment: needs external support from RW and therapist.                             Cognition Arousal/Alertness: Awake/alert Behavior During Therapy: WFL for tasks assessed/performed Overall Cognitive Status: Within Functional Limits for tasks assessed                                 General Comments: brighter affect today             Pertinent Vitals/Pain Pain Assessment: No/denies pain Pain Score: 0-No pain           PT Goals (current goals can now be found in the care plan section) Acute Rehab PT Goals Patient Stated Goal: Get stronger before going home Progress towards PT goals: Progressing toward goals    Frequency    Min 2X/week      PT Plan Current plan remains appropriate       AM-PAC PT "6 Clicks" Mobility   Outcome Measure  Help needed turning from your back to your side while in a flat bed without using bedrails?: None Help needed moving from lying on your back to sitting on the side of a flat bed without using bedrails?: None Help needed moving to and from a bed to a chair (including a wheelchair)?: None Help  needed standing up from a chair using your arms (e.g., wheelchair or bedside chair)?: A Little Help needed to walk in hospital room?: A Little Help needed climbing 3-5 steps with a railing? : A Little 6 Click Score: 21    End of Session Equipment Utilized During Treatment: Gait belt;Oxygen(3 L O2 [place]) Activity Tolerance: Patient limited by fatigue Patient left: in bed;with call bell/phone within reach Nurse Communication: Mobility status PT Visit Diagnosis: Unsteadiness on feet (R26.81);Other abnormalities of gait and mobility (R26.89);History of falling (Z91.81);Difficulty in walking, not elsewhere classified (R26.2);Adult, failure to thrive  (R62.7)     Time: 1002-1018 PT Time Calculation (min) (ACUTE ONLY): 16 min  Charges:  $Gait Training: 8-22 mins          Macallister Ashmead B. Alaisha Eversley, PT, DPT  Acute Rehabilitation [phone] pager [phone] office  @ [facility]: [phone]            03/30/2019, 4:01 PM

## 2019-03-30 NOTE — Discharge Summary (Signed)
Physician Discharge Summary  Krystal Cantu OVZ:[id number] DOB: 22-Oct-1960 DOA: 03/23/2019  PCP: Irven Shelling, MD  Admit date: 03/23/2019 Discharge date: 03/30/2019  Time spent: 35 minutes  Recommendations for Outpatient Follow-up:  Please check bmet in 1 week, continue to emphasize smoking cessation Caution with oversedation, continue to slowly wean Klonopin   Discharge Diagnoses:  Active Problems:   Acute on chronic respiratory failure with hypoxia and hypercapnia (HCC) COPD exacerbation History of BOOP Metabolic encephalopathy COPD/chronic respiratory failure Pulmonary hypertension Chronic systolic and diastolic CHF Anxiety Depression Tobacco abuse Debility/deconditioning  Discharge Condition: Improved  Diet recommendation: Low-sodium, heart healthy Filed Weights   03/28/19 1600 03/29/19 0500 03/30/19 0303  Weight: 40.2 kg 43.7 kg 44.1 kg    History of present illness:  57 year old female with history of COPD, 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  She was found unresponsive on the afternoon of 03/22/2019 and drooling by her daughter, EMS brought patient to the [facility] ER where she was hypoxic.  Hypercarbic with CO2 of 90 and pH 7.2.  BiPAP without improvement.  Patient was ultimately intubated and transferred to [facility] ICU.  She was transiently on norepinephrine.  Patient was ultimately extubated to nasal cannula oxygen on 03/24/2019 and transferred to general medical floor.  She is treated as COPD exacerbation. 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   Hospital Course:   Acute on chronic respiratory hypoxic and hypercapnic respiratory failure due to COPD exacerbation: -Status post ventilator dependent respiratory failure -Extubated on 7/31 -Clinically improving, continue prednisone taper, duo  nebs -Antibiotics completed and discontinued -Weaned O2 down to 3 L which is her baseline -Discharge to rehab on a prednisone taper of 20 mg daily for 3 more days to complete course  Positive blood cultures:  -1 of the 2 bottles grew coagulase-negative Staphylococcus which is consistent with contamination   Metabolic encephalopathy, unresponsiveness: Review of records from [facility] showed multiple ER visits which was evaluated with MRI, CT brain EEG and neurology consultation and was thought to be benzo withdrawal.   -CT head this admission is negative.  Patient with no neurological deficits. -She was definitely hypercarbic on admission with PCO2 of 90 which was contributing -Per chart review previous episodes of decreased responsiveness have been suspected to be from benzodiazepine withdrawal seizure -Patient is on high-dose Klonopin 2 mg twice daily at baseline which was resumed, and then changed to 1 mg in a.m. and 2 mg in p.m., advised very slow taper on this down to a more acceptable dose -Resolved, mental status back to normal  COPD/chronic respiratory failure -On 3 L home O2 at baseline, see discussion above  History of pulmonary hypertension: - On Revatio at home, resume this at discharge  Chronic systolic and diastolic CHF -EF is 45 to 41% with diastolic dysfunction -Continue oral Lasix  Smoker: On nicotine patch.  Anxiety, depression -Resumed Klonopin and Effexor  Deconditioning:  -PT OT evaluation completed, SNF recommended for rehabilitation  Discharge Exam: Vitals:   03/30/19 0720 03/30/19 0747  BP: 109/70   Pulse: 89   Resp: 14   Temp: 98.3 F (36.8 C)   SpO2: 100% 94%    General: AAOx3, frail cachectic Cardiovascular: S1-S2/regular rate rhythm Respiratory: Poor air movement bilaterally, no wheezes or rhonchi  Discharge Instructions    Allergies as of 03/30/2019      Reactions   Ceftriaxone  Anaphylaxis   Pregabalin Other (See  Comments)   Pt. Experienced joint pain, depression, confusion Pt. Experienced joint pain, depression, confusion   Codeine    REACTION: nausea   Itraconazole    REACTION: diarhea and nausea   Sulfamethoxazole    REACTION: diarhea and nauseted      Medication List    STOP taking these medications   ALPRAZolam 1 MG tablet Commonly known as: XANAX     TAKE these medications   acetaminophen 325 MG tablet Commonly known as: TYLENOL Take 2 tablets (650 mg total) by mouth every 4 (four) hours as needed for mild pain (temp > 101.5).   Breo Ellipta 100-25 MCG/INH Aepb Generic drug: fluticasone furoate-vilanterol Inhale 1 puff into the lungs daily.   clonazePAM 2 MG tablet Commonly known as: KLONOPIN Take 0.5-1 tablets (1-2 mg total) by mouth 2 (two) times daily. Take 1mg  in am and 2mg  in pm What changed:   how much to take  additional instructions   esomeprazole 40 MG capsule Commonly known as: NEXIUM Take 40 mg by mouth daily.   feeding supplement Liqd Take 1 Container by mouth 2 (two) times daily between meals.   fluticasone 50 MCG/ACT nasal spray Commonly known as: FLONASE Place 1 spray into both nostrils daily.   Fluticasone-Umeclidin-Vilant 100-62.5-25 MCG/INH Aepb Commonly known as: Trelegy Ellipta Inhale 1 puff into the lungs daily.   furosemide 20 MG tablet Commonly known as: LASIX Take 1 tablet (20 mg total) by mouth daily. What changed:   medication strength  how much to take   gabapentin 300 MG capsule Commonly known as: NEURONTIN Take 300 mg by mouth 3 (three) times daily.   ipratropium-albuterol 0.5-2.5 (3) MG/3ML Soln Commonly known as: DUONEB Take 3 mLs by nebulization every 6 (six) hours.   Melatonin 5 MG Tabs Take 5 mg by mouth at bedtime.   ondansetron 4 MG tablet Commonly known as: ZOFRAN Take 4-8 mg by mouth every 8 (eight) hours as needed for nausea or vomiting.   OXYGEN Place 2-3 L into the nose See admin instructions. 2lpm with  rest and 3lpm with exertion   predniSONE 20 MG tablet Commonly known as: DELTASONE Take 1 tablet (20 mg total) by mouth daily with breakfast for 3 days. Start taking on: March 31, 2019   ProAir HFA 108 (90 Base) MCG/ACT inhaler Generic drug: albuterol Inhale 2 puffs into the lungs every 6 (six) hours as needed for wheezing or shortness of breath.   venlafaxine XR 75 MG 24 hr capsule Commonly known as: EFFEXOR-XR Take 75 mg by mouth daily with breakfast.   Vitamin D-1000 Max St 25 MCG (1000 UT) tablet Generic drug: Cholecalciferol Take 1,000 Units by mouth daily.      Allergies  Allergen Reactions  . Ceftriaxone Anaphylaxis  . Pregabalin Other (See Comments)    Pt. Experienced joint pain, depression, confusion Pt. Experienced joint pain, depression, confusion   . Codeine     REACTION: nausea  . Itraconazole     REACTION: diarhea and nausea  . Sulfamethoxazole     REACTION: diarhea and nauseted   Contact information for after-discharge care    Destination    [facility] .   Service: Skilled Nursing Contact information: [place] [phone]               The results of significant diagnostics from this hospitalization (including imaging, microbiology, ancillary and laboratory) are listed below for reference.  Significant Diagnostic Studies: Dg Chest 2 View  Result Date: 03/27/2019 CLINICAL DATA:  Shortness of breath this morning. Weakness and fatigue. EXAM: CHEST - 2 VIEW COMPARISON:  Single-view of the chest 03/23/2019, 03/22/2019 and 02/11/2019. CT chest 03/22/2019. FINDINGS: Cardiomegaly, emphysema, biapical scar and bronchiectasis are again seen. Aeration has worsened in the mid and lower lung zones bilaterally. No pneumothorax or pleural fluid. No acute or focal bony abnormality. IMPRESSION: Worsened airspace disease in the mid and lower lung zones bilaterally has an appearance most  suggestive of pulmonary edema. Emphysema and bronchiectasis. Electronically Signed   By: Inge Rise M.D.   On: 03/27/2019 09:25   Dg Chest Port 1 View  Result Date: 03/23/2019 CLINICAL DATA:  Respiratory failure. Endotracheal tube placement. EXAM: PORTABLE CHEST 1 VIEW 5:30 p.m. COMPARISON:  03/23/2019 at 6:15 a.m. FINDINGS: Endotracheal tube is 4.7 cm above the carina. NG tube tip is in the body of the stomach. Heart size and pulmonary vascularity are normal. There is diffuse accentuation of the interstitial markings particularly in the right upper lobe where there is an ill-defined infiltrate. There is also streaky infiltrate in the left lung apex. However, both of these areas of infiltrate have improved since the prior study of earlier on the same date. No discrete effusion. IMPRESSION: 1. Endotracheal tube and NG tube appear in good position. 2. Slightly improving bilateral infiltrates. Electronically Signed   By: Lorriane Shire M.D.   On: 03/23/2019 17:43    Microbiology: Recent Results (from the past 240 hour(s))  MRSA PCR Screening     Status: None   Collection Time: 03/23/19  5:07 PM   Specimen: Nasal Mucosa; Nasopharyngeal  Result Value Ref Range Status   MRSA by PCR NEGATIVE NEGATIVE Final    Comment:        The GeneXpert MRSA Assay (FDA approved for NASAL specimens only), is one component of a comprehensive MRSA colonization surveillance program. It is not intended to diagnose MRSA infection nor to guide or monitor treatment for MRSA infections. Performed at [facility], [place] [address] [place], [place] [address]   Culture, blood (routine x 2)     Status: Abnormal   Collection Time: 03/23/19  7:00 PM   Specimen: BLOOD  Result Value Ref Range Status   Specimen Description BLOOD RIGHT ANTECUBITAL  Final   Special Requests   Final    BOTTLES DRAWN AEROBIC AND ANAEROBIC Blood Culture adequate volume   Culture  Setup Time   Final    GRAM POSITIVE COCCI IN  CLUSTERS AEROBIC BOTTLE ONLY CRITICAL RESULT CALLED TO, READ BACK BY AND VERIFIED WITH: G. ABBOTT,PHARMD 0629 03/25/2019 T. TYSOR    Culture (A)  Final    STAPHYLOCOCCUS SPECIES (COAGULASE NEGATIVE) THE SIGNIFICANCE OF ISOLATING THIS ORGANISM FROM A SINGLE SET OF BLOOD CULTURES WHEN MULTIPLE SETS ARE DRAWN IS UNCERTAIN. PLEASE NOTIFY THE MICROBIOLOGY DEPARTMENT WITHIN ONE WEEK IF SPECIATION AND SENSITIVITIES ARE REQUIRED. Performed at [facility], [place] [address] [place], [place] [address]    Report Status 03/27/2019 FINAL  Final  Blood Culture ID Panel (Reflexed)     Status: None   Collection Time: 03/23/19  7:00 PM  Result Value Ref Range Status   Enterococcus species NOT DETECTED NOT DETECTED Final   Listeria monocytogenes NOT DETECTED NOT DETECTED Final   Staphylococcus species NOT DETECTED NOT DETECTED Final   Staphylococcus aureus (BCID) NOT DETECTED NOT DETECTED Final   Streptococcus species NOT DETECTED NOT DETECTED Final  Streptococcus agalactiae NOT DETECTED NOT DETECTED Final   Streptococcus pneumoniae NOT DETECTED NOT DETECTED Final   Streptococcus pyogenes NOT DETECTED NOT DETECTED Final   Acinetobacter baumannii NOT DETECTED NOT DETECTED Final   Enterobacteriaceae species NOT DETECTED NOT DETECTED Final   Enterobacter cloacae complex NOT DETECTED NOT DETECTED Final   Escherichia coli NOT DETECTED NOT DETECTED Final   Klebsiella oxytoca NOT DETECTED NOT DETECTED Final   Klebsiella pneumoniae NOT DETECTED NOT DETECTED Final   Proteus species NOT DETECTED NOT DETECTED Final   Serratia marcescens NOT DETECTED NOT DETECTED Final   Haemophilus influenzae NOT DETECTED NOT DETECTED Final   Neisseria meningitidis NOT DETECTED NOT DETECTED Final   Pseudomonas aeruginosa NOT DETECTED NOT DETECTED Final   Candida albicans NOT DETECTED NOT DETECTED Final   Candida glabrata NOT DETECTED NOT DETECTED Final   Candida krusei NOT DETECTED NOT DETECTED Final   Candida  parapsilosis NOT DETECTED NOT DETECTED Final   Candida tropicalis NOT DETECTED NOT DETECTED Final    Comment: Performed at [facility], [place] [address], [place], [place] [address]  Culture, blood (routine x 2)     Status: None   Collection Time: 03/23/19  7:15 PM   Specimen: BLOOD RIGHT HAND  Result Value Ref Range Status   Specimen Description BLOOD RIGHT HAND  Final   Special Requests   Final    BOTTLES DRAWN AEROBIC ONLY Blood Culture results may not be optimal due to an excessive volume of blood received in culture bottles   Culture   Final    NO GROWTH 5 DAYS Performed at [facility], [place] [address], [place], [place] [address]    Report Status 03/28/2019 FINAL  Final  SARS Coronavirus 2 [facility] order, Performed in [place] hospital lab)     Status: None   Collection Time: 03/29/19  3:00 PM  Result Value Ref Range Status   SARS Coronavirus 2 NEGATIVE NEGATIVE Final    Comment: (NOTE) If result is NEGATIVE SARS-CoV-2 target nucleic acids are NOT DETECTED. The SARS-CoV-2 RNA is generally detectable in upper and lower  respiratory specimens during the acute phase of infection. The lowest  concentration of SARS-CoV-2 viral copies this assay can detect is 250  copies / mL. A negative result does not preclude SARS-CoV-2 infection  and should not be used as the sole basis for treatment or other  patient management decisions.  A negative result may occur with  improper specimen collection / handling, submission of specimen other  than nasopharyngeal swab, presence of viral mutation(s) within the  areas targeted by this assay, and inadequate number of viral copies  (<250 copies / mL). A negative result must be combined with clinical  observations, patient history, and epidemiological information. If result is POSITIVE SARS-CoV-2 target nucleic acids are DETECTED. The SARS-CoV-2 RNA is generally detectable in upper and lower  respiratory specimens dur ing the acute  phase of infection.  Positive  results are indicative of active infection with SARS-CoV-2.  Clinical  correlation with patient history and other diagnostic information is  necessary to determine patient infection status.  Positive results do  not rule out bacterial infection or co-infection with other viruses. If result is PRESUMPTIVE POSTIVE SARS-CoV-2 nucleic acids MAY BE PRESENT.   A presumptive positive result was obtained on the submitted specimen  and confirmed on repeat testing.  While 2019 novel coronavirus  (SARS-CoV-2) nucleic acids may be present in the submitted sample  additional confirmatory testing may  be necessary for epidemiological  and / or clinical management purposes  to differentiate between  SARS-CoV-2 and other Sarbecovirus currently known to infect humans.  If clinically indicated additional testing with an alternate test  methodology [phone]) is advised. The SARS-CoV-2 RNA is generally  detectable in upper and lower respiratory sp ecimens during the acute  phase of infection. The expected result is Negative. Fact Sheet for Patients:  StrictlyIdeas.no Fact Sheet for Healthcare Providers: BankingDealers.co.za This test is not yet approved or cleared by the [place] FDA and has been authorized for detection and/or diagnosis of SARS-CoV-2 by FDA under an Emergency Use Authorization (EUA).  This EUA will remain in effect (meaning this test can be used) for the duration of the COVID-19 declaration under Section 564(b)(1) of the Act, 21 U.S.C. section 360bbb-3(b)(1), unless the authorization is terminated or revoked sooner. Performed at [facility], [place] [address] [place], [place] [address]      Labs: Basic Metabolic Panel: Recent Labs  Lab 03/23/19 1738  03/25/19 0504 03/26/19 0704 03/27/19 0538 03/28/19 0914 03/29/19 0541 03/30/19 0554  NA 139   < > 138 141 141 137 139 135  K 5.3*   < > 4.2  5.2* 4.8 3.9 4.5 3.9  CL 94*   < > 96* 95* 88* 82* 85* 87*  CO2 37*   < > 35* 39* 44* 45* 43* 37*  GLUCOSE 118*   < > 100* 123* 111* 207* 97 157*  BUN 23*   < > 20 18 17  22* 23* 33*  CREATININE 0.88   < > 0.59 0.69 0.78 0.77 0.75 1.08*  CALCIUM 8.7*   < > 8.5* 8.8* 8.6* 8.6* 9.5 9.3  MG 1.9  --  1.9  --   --   --   --   --   PHOS 4.2  --  3.3  --   --  2.4*  --   --    < > = values in this interval not displayed.   Liver Function Tests: Recent Labs  Lab 03/28/19 0914 03/29/19 0541 03/30/19 0554  AST  --  13* 13*  ALT  --  17 17  ALKPHOS  --  65 59  BILITOT  --  0.3 0.2*  PROT  --  6.6 7.1  ALBUMIN 2.6* 2.9* 3.1*   No results for input(s): LIPASE, AMYLASE in the last 168 hours. No results for input(s): AMMONIA in the last 168 hours. CBC: Recent Labs  Lab 03/26/19 0704 03/27/19 0538 03/28/19 0914 03/28/19 1221 03/29/19 0541 03/30/19 0554  WBC 4.3 8.7 11.7*  --  14.8* 12.9*  NEUTROABS  --   --  5.8  --  7.7 8.9*  HGB 8.0* 8.4* 9.3*  --  10.1* 10.4*  HCT 26.7* 28.9* 31.6* 33.8* 34.3* 35.5*  MCV 103.1* 107.4* 105.0*  --  103.0* 103.5*  PLT 404* 425* 544*  --  640* 714*   Cardiac Enzymes: No results for input(s): CKTOTAL, CKMB, CKMBINDEX, TROPONINI in the last 168 hours. BNP: BNP (last 3 results) Recent Labs    03/23/19 1747  BNP 431.3*    ProBNP (last 3 results) No results for input(s): PROBNP in the last 8760 hours.  CBG: Recent Labs  Lab 03/29/19 1913 03/29/19 2344 03/30/19 0306 03/30/19 0718 03/30/19 1117  GLUCAP 251* 156* 151* 103* 141*       Signed:  Domenic Polite MD.  [facility] 03/30/2019, 11:24 AM

## 2019-03-31 NOTE — Telephone Encounter (Signed)
Checked pt's chart and pt has been discharged from the hospital. Pt was admitted at [facility] from 7/30-8/6. Pt had covid test performed 8/5 while in hospital and the results were negative.   Called and spoke with pt letting her know that we were needing to get her scheduled for a f/u with MW. Pt said that she was currently at [place] home and did not know how long she was going to be in there. Pt said that she would call the office once released from there to get an appt scheduled with MW. Pt asked for me to call back and leave her a VM with our office phone number so she would have it to call back and get an appt scheduled. I have called pt back and left her a detailed message with our office phone number. Nothing further needed.

## 2019-04-29 DIAGNOSIS — K219 Gastro-esophageal reflux disease without esophagitis: Secondary | ICD-10-CM

## 2019-04-29 DIAGNOSIS — J441 Chronic obstructive pulmonary disease with (acute) exacerbation: Secondary | ICD-10-CM

## 2019-04-29 DIAGNOSIS — J84116 Cryptogenic organizing pneumonia: Secondary | ICD-10-CM

## 2019-04-29 DIAGNOSIS — E7841 Elevated Lipoprotein(a): Secondary | ICD-10-CM | POA: Diagnosis not present

## 2019-04-29 DIAGNOSIS — J9601 Acute respiratory failure with hypoxia: Secondary | ICD-10-CM

## 2019-04-30 DIAGNOSIS — E7841 Elevated Lipoprotein(a): Secondary | ICD-10-CM | POA: Diagnosis not present

## 2019-04-30 DIAGNOSIS — J9601 Acute respiratory failure with hypoxia: Secondary | ICD-10-CM | POA: Diagnosis not present

## 2019-04-30 DIAGNOSIS — J441 Chronic obstructive pulmonary disease with (acute) exacerbation: Secondary | ICD-10-CM | POA: Diagnosis not present

## 2019-04-30 DIAGNOSIS — K219 Gastro-esophageal reflux disease without esophagitis: Secondary | ICD-10-CM | POA: Diagnosis not present

## 2019-05-01 DIAGNOSIS — J441 Chronic obstructive pulmonary disease with (acute) exacerbation: Secondary | ICD-10-CM | POA: Diagnosis not present

## 2019-05-01 DIAGNOSIS — J9601 Acute respiratory failure with hypoxia: Secondary | ICD-10-CM | POA: Diagnosis not present

## 2019-05-01 DIAGNOSIS — K219 Gastro-esophageal reflux disease without esophagitis: Secondary | ICD-10-CM | POA: Diagnosis not present

## 2019-05-01 DIAGNOSIS — E7841 Elevated Lipoprotein(a): Secondary | ICD-10-CM | POA: Diagnosis not present

## 2019-05-02 DIAGNOSIS — J441 Chronic obstructive pulmonary disease with (acute) exacerbation: Secondary | ICD-10-CM | POA: Diagnosis not present

## 2019-05-02 DIAGNOSIS — K219 Gastro-esophageal reflux disease without esophagitis: Secondary | ICD-10-CM | POA: Diagnosis not present

## 2019-05-02 DIAGNOSIS — E7841 Elevated Lipoprotein(a): Secondary | ICD-10-CM | POA: Diagnosis not present

## 2019-05-02 DIAGNOSIS — J9601 Acute respiratory failure with hypoxia: Secondary | ICD-10-CM | POA: Diagnosis not present

## 2019-05-20 ENCOUNTER — Inpatient Hospital Stay (HOSPITAL_COMMUNITY)
Admission: AD | Admit: 2019-05-20 | Discharge: 2019-06-08 | DRG: 208 | Disposition: A | Discharge status: Skilled Nursing Facility | Payer: Medicare Other | Source: Other Acute Inpatient Hospital | Attending: Internal Medicine | Admitting: Internal Medicine

## 2019-05-20 ENCOUNTER — Inpatient Hospital Stay (HOSPITAL_COMMUNITY): Payer: Medicare Other

## 2019-05-20 DIAGNOSIS — J441 Chronic obstructive pulmonary disease with (acute) exacerbation: Secondary | ICD-10-CM | POA: Diagnosis present

## 2019-05-20 DIAGNOSIS — E872 Acidosis: Secondary | ICD-10-CM | POA: Diagnosis present

## 2019-05-20 DIAGNOSIS — Z515 Encounter for palliative care: Secondary | ICD-10-CM

## 2019-05-20 DIAGNOSIS — I959 Hypotension, unspecified: Secondary | ICD-10-CM | POA: Diagnosis present

## 2019-05-20 DIAGNOSIS — J9621 Acute and chronic respiratory failure with hypoxia: Secondary | ICD-10-CM | POA: Diagnosis present

## 2019-05-20 DIAGNOSIS — Z888 Allergy status to other drugs, medicaments and biological substances status: Secondary | ICD-10-CM

## 2019-05-20 DIAGNOSIS — R0602 Shortness of breath: Secondary | ICD-10-CM | POA: Diagnosis present

## 2019-05-20 DIAGNOSIS — J8410 Pulmonary fibrosis, unspecified: Secondary | ICD-10-CM | POA: Diagnosis present

## 2019-05-20 DIAGNOSIS — R531 Weakness: Secondary | ICD-10-CM

## 2019-05-20 DIAGNOSIS — Z978 Presence of other specified devices: Secondary | ICD-10-CM

## 2019-05-20 DIAGNOSIS — Z79899 Other long term (current) drug therapy: Secondary | ICD-10-CM | POA: Diagnosis not present

## 2019-05-20 DIAGNOSIS — Z885 Allergy status to narcotic agent status: Secondary | ICD-10-CM

## 2019-05-20 DIAGNOSIS — R0600 Dyspnea, unspecified: Secondary | ICD-10-CM | POA: Diagnosis not present

## 2019-05-20 DIAGNOSIS — J849 Interstitial pulmonary disease, unspecified: Secondary | ICD-10-CM | POA: Diagnosis not present

## 2019-05-20 DIAGNOSIS — M40209 Unspecified kyphosis, site unspecified: Secondary | ICD-10-CM | POA: Diagnosis present

## 2019-05-20 DIAGNOSIS — E440 Moderate protein-calorie malnutrition: Secondary | ICD-10-CM | POA: Diagnosis present

## 2019-05-20 DIAGNOSIS — E1165 Type 2 diabetes mellitus with hyperglycemia: Secondary | ICD-10-CM | POA: Diagnosis not present

## 2019-05-20 DIAGNOSIS — Z20828 Contact with and (suspected) exposure to other viral communicable diseases: Secondary | ICD-10-CM | POA: Diagnosis present

## 2019-05-20 DIAGNOSIS — Z882 Allergy status to sulfonamides status: Secondary | ICD-10-CM | POA: Diagnosis not present

## 2019-05-20 DIAGNOSIS — J9622 Acute and chronic respiratory failure with hypercapnia: Secondary | ICD-10-CM | POA: Diagnosis present

## 2019-05-20 DIAGNOSIS — J9601 Acute respiratory failure with hypoxia: Secondary | ICD-10-CM | POA: Diagnosis not present

## 2019-05-20 DIAGNOSIS — Z881 Allergy status to other antibiotic agents status: Secondary | ICD-10-CM

## 2019-05-20 DIAGNOSIS — Z66 Do not resuscitate: Secondary | ICD-10-CM | POA: Diagnosis not present

## 2019-05-20 DIAGNOSIS — F419 Anxiety disorder, unspecified: Secondary | ICD-10-CM

## 2019-05-20 DIAGNOSIS — Z7951 Long term (current) use of inhaled steroids: Secondary | ICD-10-CM

## 2019-05-20 DIAGNOSIS — F17210 Nicotine dependence, cigarettes, uncomplicated: Secondary | ICD-10-CM | POA: Diagnosis present

## 2019-05-20 DIAGNOSIS — Z681 Body mass index (BMI) 19 or less, adult: Secondary | ICD-10-CM | POA: Diagnosis not present

## 2019-05-20 DIAGNOSIS — R0609 Other forms of dyspnea: Secondary | ICD-10-CM | POA: Diagnosis not present

## 2019-05-20 DIAGNOSIS — Z7189 Other specified counseling: Secondary | ICD-10-CM | POA: Diagnosis not present

## 2019-05-20 DIAGNOSIS — I2729 Other secondary pulmonary hypertension: Secondary | ICD-10-CM | POA: Diagnosis present

## 2019-05-20 DIAGNOSIS — Z9911 Dependence on respirator [ventilator] status: Secondary | ICD-10-CM | POA: Diagnosis not present

## 2019-05-20 HISTORY — DX: Chronic obstructive pulmonary disease, unspecified: J44.9

## 2019-05-20 LAB — COMPREHENSIVE METABOLIC PANEL
ALT: 19 U/L (ref 0–44)
AST: 22 U/L (ref 15–41)
Albumin: 2.3 g/dL — ABNORMAL LOW (ref 3.5–5.0)
Alkaline Phosphatase: 73 U/L (ref 38–126)
Anion gap: 10 (ref 5–15)
BUN: 15 mg/dL (ref 6–20)
CO2: 35 mmol/L — ABNORMAL HIGH (ref 22–32)
Calcium: 6.9 mg/dL — ABNORMAL LOW (ref 8.9–10.3)
Chloride: 91 mmol/L — ABNORMAL LOW (ref 98–111)
Creatinine, Ser: 0.73 mg/dL (ref 0.44–1.00)
GFR calc Af Amer: >60 mL/min (ref >60)
GFR calc non Af Amer: >60 mL/min (ref >60)
Glucose, Bld: 147 mg/dL — ABNORMAL HIGH (ref 70–99)
Potassium: 4.4 mmol/L (ref 3.5–5.1)
Sodium: 136 mmol/L (ref 135–145)
Total Bilirubin: 0.7 mg/dL (ref 0.3–1.2)
Total Protein: 6.1 g/dL — ABNORMAL LOW (ref 6.5–8.1)

## 2019-05-20 LAB — CBC WITH DIFFERENTIAL/PLATELET
Abs Immature Granulocytes: 0.09 10*3/uL — ABNORMAL HIGH (ref 0.00–0.07)
Basophils Absolute: 0 10*3/uL (ref 0.0–0.1)
Basophils Relative: 0 %
Eosinophils Absolute: 0 10*3/uL (ref 0.0–0.5)
Eosinophils Relative: 0 %
HCT: 28.2 % — ABNORMAL LOW (ref 36.0–46.0)
Hemoglobin: 8.3 g/dL — ABNORMAL LOW (ref 12.0–15.0)
Immature Granulocytes: 1 %
Lymphocytes Relative: 2 %
Lymphs Abs: 0.3 10*3/uL — ABNORMAL LOW (ref 0.7–4.0)
MCH: 30.4 pg (ref 26.0–34.0)
MCHC: 29.4 g/dL — ABNORMAL LOW (ref 30.0–36.0)
MCV: 103.3 fL — ABNORMAL HIGH (ref 80.0–100.0)
Monocytes Absolute: 0.4 10*3/uL (ref 0.1–1.0)
Monocytes Relative: 3 %
Neutro Abs: 11.1 10*3/uL — ABNORMAL HIGH (ref 1.7–7.7)
Neutrophils Relative %: 94 %
Platelets: 352 10*3/uL (ref 150–400)
RBC: 2.73 MIL/uL — ABNORMAL LOW (ref 3.87–5.11)
RDW: 17.4 % — ABNORMAL HIGH (ref 11.5–15.5)
WBC: 11.8 10*3/uL — ABNORMAL HIGH (ref 4.0–10.5)
nRBC: 0 % (ref 0.0–0.2)

## 2019-05-20 LAB — GLUCOSE, CAPILLARY
Glucose-Capillary: 113 mg/dL — ABNORMAL HIGH (ref 70–99)
Glucose-Capillary: 143 mg/dL — ABNORMAL HIGH (ref 70–99)
Glucose-Capillary: 144 mg/dL — ABNORMAL HIGH (ref 70–99)

## 2019-05-20 LAB — POCT I-STAT 7, (LYTES, BLD GAS, ICA,H+H)
Acid-Base Excess: 11 mmol/L — ABNORMAL HIGH (ref 0.0–2.0)
Bicarbonate: 38.5 mmol/L — ABNORMAL HIGH (ref 20.0–28.0)
Calcium, Ion: 1 mmol/L — ABNORMAL LOW (ref 1.15–1.40)
HCT: 26 % — ABNORMAL LOW (ref 36.0–46.0)
Hemoglobin: 8.8 g/dL — ABNORMAL LOW (ref 12.0–15.0)
O2 Saturation: 90 %
Patient temperature: 37.3
Potassium: 4 mmol/L (ref 3.5–5.1)
Sodium: 133 mmol/L — ABNORMAL LOW (ref 135–145)
TCO2: 41 mmol/L — ABNORMAL HIGH (ref 22–32)
pCO2 arterial: 74 mmHg (ref 32.0–48.0)
pH, Arterial: 7.326 — ABNORMAL LOW (ref 7.350–7.450)
pO2, Arterial: 68 mmHg — ABNORMAL LOW (ref 83.0–108.0)

## 2019-05-20 LAB — MAGNESIUM: Magnesium: 2.3 mg/dL (ref 1.7–2.4)

## 2019-05-20 LAB — MRSA PCR SCREENING: MRSA by PCR: NEGATIVE

## 2019-05-20 LAB — PHOSPHORUS: Phosphorus: 2.4 mg/dL — ABNORMAL LOW (ref 2.5–4.6)

## 2019-05-20 LAB — PROCALCITONIN: Procalcitonin: 1.46 ng/mL

## 2019-05-20 MED ORDER — IPRATROPIUM-ALBUTEROL 0.5-2.5 (3) MG/3ML IN SOLN
3.0000 mL | Freq: Four times a day (QID) | RESPIRATORY_TRACT | Status: DC
  Administered 2019-05-20 (×2): 3 mL via RESPIRATORY_TRACT
  Filled 2019-05-20 (×3): qty 3

## 2019-05-20 MED ORDER — ACETAZOLAMIDE SODIUM 500 MG IJ SOLR
500.0000 mg | Freq: Two times a day (BID) | INTRAMUSCULAR | Status: DC
  Filled 2019-05-20: qty 500

## 2019-05-20 MED ORDER — ORAL CARE MOUTH RINSE
15.0000 mL | OROMUCOSAL | Status: DC
  Administered 2019-05-20 – 2019-05-31 (×59): 15 mL via OROMUCOSAL

## 2019-05-20 MED ORDER — VENLAFAXINE HCL 75 MG PO TABS
75.0000 mg | ORAL_TABLET | Freq: Two times a day (BID) | ORAL | Status: DC
  Administered 2019-05-20 – 2019-06-08 (×35): 75 mg via ORAL
  Filled 2019-05-20 (×43): qty 1

## 2019-05-20 MED ORDER — SILDENAFIL CITRATE 20 MG PO TABS: 20.0000 mg | ORAL_TABLET | Freq: Three times a day (TID) | ORAL | Status: DC

## 2019-05-20 MED ORDER — FUROSEMIDE 10 MG/ML IJ SOLN: 40.0000 mg | Freq: Three times a day (TID) | INTRAMUSCULAR | Status: DC

## 2019-05-20 MED ORDER — SODIUM CHLORIDE 0.9 % IV SOLN
250.0000 mL | INTRAVENOUS | Status: DC
  Administered 2019-05-20 – 2019-05-23 (×3): 250 mL via INTRAVENOUS

## 2019-05-20 MED ORDER — INSULIN ASPART 100 UNIT/ML ~~LOC~~ SOLN
0.0000 [IU] | SUBCUTANEOUS | Status: DC
  Administered 2019-05-20 – 2019-05-21 (×3): 2 [IU] via SUBCUTANEOUS
  Administered 2019-05-21 (×2): 3 [IU] via SUBCUTANEOUS
  Administered 2019-05-21: 2 [IU] via SUBCUTANEOUS
  Administered 2019-05-22 (×3): 3 [IU] via SUBCUTANEOUS
  Administered 2019-05-22: 20:00:00 2 [IU] via SUBCUTANEOUS
  Administered 2019-05-22 – 2019-05-23 (×3): 3 [IU] via SUBCUTANEOUS
  Administered 2019-05-23 – 2019-05-24 (×3): 2 [IU] via SUBCUTANEOUS
  Administered 2019-05-25: 3 [IU] via SUBCUTANEOUS
  Administered 2019-05-25 (×2): 2 [IU] via SUBCUTANEOUS
  Administered 2019-05-26: 5 [IU] via SUBCUTANEOUS
  Administered 2019-05-26: 3 [IU] via SUBCUTANEOUS
  Administered 2019-05-26: 8 [IU] via SUBCUTANEOUS
  Administered 2019-05-27: 3 [IU] via SUBCUTANEOUS
  Administered 2019-05-27 (×3): 2 [IU] via SUBCUTANEOUS
  Administered 2019-05-27 (×2): 3 [IU] via SUBCUTANEOUS
  Administered 2019-05-28 (×2): 2 [IU] via SUBCUTANEOUS
  Administered 2019-05-28 – 2019-05-29 (×3): 3 [IU] via SUBCUTANEOUS
  Administered 2019-05-29: 2 [IU] via SUBCUTANEOUS
  Administered 2019-05-29: 3 [IU] via SUBCUTANEOUS
  Administered 2019-05-29 – 2019-05-30 (×2): 2 [IU] via SUBCUTANEOUS
  Administered 2019-05-30 – 2019-05-31 (×2): 3 [IU] via SUBCUTANEOUS
  Administered 2019-06-01 (×3): 2 [IU] via SUBCUTANEOUS
  Administered 2019-06-01: 3 [IU] via SUBCUTANEOUS
  Administered 2019-06-02: 2 [IU] via SUBCUTANEOUS
  Administered 2019-06-02: 3 [IU] via SUBCUTANEOUS
  Administered 2019-06-03 (×2): 2 [IU] via SUBCUTANEOUS
  Administered 2019-06-04: 3 [IU] via SUBCUTANEOUS
  Administered 2019-06-04: 2 [IU] via SUBCUTANEOUS
  Administered 2019-06-05: 3 [IU] via SUBCUTANEOUS
  Administered 2019-06-06 (×2): 2 [IU] via SUBCUTANEOUS
  Administered 2019-06-07: 3 [IU] via SUBCUTANEOUS
  Administered 2019-06-07: 2 [IU] via SUBCUTANEOUS

## 2019-05-20 MED ORDER — ENOXAPARIN SODIUM 40 MG/0.4ML ~~LOC~~ SOLN
40.0000 mg | SUBCUTANEOUS | Status: DC
  Administered 2019-05-20 – 2019-05-23 (×4): 40 mg via SUBCUTANEOUS
  Filled 2019-05-20 (×4): qty 0.4

## 2019-05-20 MED ORDER — ALPRAZOLAM 0.5 MG PO TABS
1.0000 mg | ORAL_TABLET | Freq: Two times a day (BID) | ORAL | Status: DC
  Administered 2019-05-20 – 2019-05-23 (×7): 1 mg via ORAL
  Filled 2019-05-20 (×7): qty 2

## 2019-05-20 MED ORDER — SODIUM CHLORIDE 0.9 % IV SOLN
500.0000 mg | INTRAVENOUS | Status: AC
  Administered 2019-05-20 – 2019-05-22 (×3): 500 mg via INTRAVENOUS
  Filled 2019-05-20 (×3): qty 500

## 2019-05-20 MED ORDER — PANTOPRAZOLE SODIUM 40 MG PO PACK
40.0000 mg | PACK | Freq: Every day | ORAL | Status: DC
  Administered 2019-05-21 – 2019-05-23 (×3): 40 mg
  Filled 2019-05-20 (×3): qty 20

## 2019-05-20 MED ORDER — GABAPENTIN 250 MG/5ML PO SOLN
300.0000 mg | Freq: Three times a day (TID) | ORAL | Status: DC
  Administered 2019-05-20 – 2019-06-01 (×33): 300 mg via ORAL
  Filled 2019-05-20 (×39): qty 6

## 2019-05-20 MED ORDER — LACTATED RINGERS IV SOLN
INTRAVENOUS | Status: DC
  Administered 2019-05-20: 16:00:00 via INTRAVENOUS

## 2019-05-20 MED ORDER — METHYLPREDNISOLONE SODIUM SUCC 125 MG IJ SOLR
60.0000 mg | Freq: Three times a day (TID) | INTRAMUSCULAR | Status: DC
  Administered 2019-05-20 – 2019-05-22 (×6): 60 mg via INTRAVENOUS
  Filled 2019-05-20 (×6): qty 2

## 2019-05-20 MED ORDER — FENTANYL CITRATE (PF) 100 MCG/2ML IJ SOLN
50.0000 ug | INTRAMUSCULAR | Status: DC | PRN
  Administered 2019-05-20 – 2019-05-23 (×7): 50 ug via INTRAVENOUS
  Filled 2019-05-20 (×7): qty 2

## 2019-05-20 MED ORDER — MELATONIN 3 MG PO TABS
3.0000 mg | ORAL_TABLET | Freq: Every day | ORAL | Status: DC
  Administered 2019-05-20 – 2019-06-07 (×16): 3 mg
  Filled 2019-05-20 (×21): qty 1

## 2019-05-20 MED ORDER — BUDESONIDE 0.25 MG/2ML IN SUSP
0.2500 mg | Freq: Two times a day (BID) | RESPIRATORY_TRACT | Status: DC
  Administered 2019-05-20 – 2019-05-21 (×2): 0.25 mg via RESPIRATORY_TRACT
  Filled 2019-05-20 (×2): qty 2

## 2019-05-20 MED ORDER — CHLORHEXIDINE GLUCONATE CLOTH 2 % EX PADS
6.0000 | MEDICATED_PAD | Freq: Every day | CUTANEOUS | Status: DC
  Administered 2019-05-20 – 2019-05-30 (×9): 6 via TOPICAL

## 2019-05-20 MED ORDER — NOREPINEPHRINE 4 MG/250ML-% IV SOLN
2.0000 ug/min | INTRAVENOUS | Status: DC
  Administered 2019-05-20: 16:00:00 4 ug/min via INTRAVENOUS
  Filled 2019-05-20: qty 250

## 2019-05-20 MED ORDER — LORAZEPAM 2 MG/ML IJ SOLN
1.0000 mg | Freq: Once | INTRAMUSCULAR | Status: AC
  Administered 2019-05-20: 16:00:00 1 mg via INTRAVENOUS
  Filled 2019-05-20: qty 1

## 2019-05-20 MED ORDER — CHLORHEXIDINE GLUCONATE 0.12% ORAL RINSE (MEDLINE KIT)
15.0000 mL | Freq: Two times a day (BID) | OROMUCOSAL | Status: DC
  Administered 2019-05-20 – 2019-06-07 (×28): 15 mL via OROMUCOSAL

## 2019-05-20 NOTE — H&P (Addendum)
NAME:  Krystal Cantu, MRN:  [id number], DOB:  1961/06/13, LOS: 0 ADMISSION DATE:  05/20/2019, CONSULTATION DATE:  9/26 REFERRING MD:  [facility] , CHIEF COMPLAINT:  Acute on chronic resp failure    Brief History   58 year old female admitted with acute on chronic hypoxic and hypercarbic respiratory failure requiring mechanical ventilation on 9/26.  History of present illness   This is a 58 year old female patient with a complicated pulmonary history as mentioned below followed by Dr. Melvyn Novas  in our clinic.  Last hospitalized in August 2020 for acute on chronic respiratory failure felt  Presents to the emergency room via EMS after being found at home unresponsive on 9/26.  Apparently had been in normal state of health the night prior.  On EMS arrival Glasgow Coma Scale was 3 with pulse oximetry at 44% this improved with saturations up to 97% on supplemental oxygen but she remained unresponsive, did not improve with BiPAP so was placed on mechanical ventilator and subsequently transferred to [facility] for further evaluation.  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  Known significant component of restrictive lung disease superimposed on underlying COPD Due to COPD exacerbation this did require mechanical ventilation.   Significant Hospital Events     Consults:    Procedures:  Endotracheal tube 9/26  Significant Diagnostic Tests:    Micro Data:  Respiratory culture 9/26>>>  Antimicrobials:   azith 9/26>>> Interim history/subjective:  More awake on admission  Objective   Blood  pressure 111/71, pulse 89, resp. rate (Abnormal) 24, height 5\' 1"  (1.549 m), SpO2 97 %.    Vent Mode: PCV FiO2 (%):  [50 %] 50 % Set Rate:  [24 bmp] 24 bmp PEEP:  [8 cmH20] 8 cmH20 Plateau Pressure:  [34 cmH20] 34 cmH20  No intake or output data in the 24 hours ending 05/20/19 1420 There were no vitals filed for this visit.  Examination: General: This is a frail 58 year old female who appears much older than stated age currently on mechanical ventilation but in no acute distress she does trigger apnea alarm when spontaneous ventilation attempted HENT: Normocephalic atraumatic no jugular venous distention sclera are nonicteric orally intubated Lungs: Diffuse scattered rales and rhonchi no accessory use unable to trigger adequate spontaneous ventilation on pressure support Cardiovascular: Regular rate and rhythm Abdomen: Soft not tender Extremities: Brisk capillary refill without edema Neuro: Awake oriented following commands writing on [facility] Problem list    Assessment & Plan:   Acute on chronic hypoxic and hypercarbic respiratory failure in the setting of what appears to be acute COPD exacerbation superimposed on both severe COPD and underlying restrictive lung disease so raises concern about ILD flare.  She has had a prior history of ILD felt RB ILD secondary to smoking she had been smoking up until last hospital admission just August Plan Continuing full ventilator support for now Scheduled bronchodilators IV Solu-Medrol cxr Sputum culture  Checking pro calcitonin IV azithromycin day 1 PAD protocol RA SS goal 0 to -1 VAP bundle Daily assessment for weaning, ideally try to get her liberated from mechanical ventilator as soon as possible We will need to readdress goals  of care post extubation  Hypotension.  Suspect this is secondary to acidemia, currently weaning norepinephrine Plan Continue gentle hydration Wean norepinephrine for start blood pressure  greater than 90  Hyperglycemia  Plan ssi    Best practice:  Diet: NPO Pain/Anxiety/Delirium protocol (if indicated): 9/26 Code Status: full code  DVT prophylaxis 9/26 SUP: PPI DM:SSI Family Communication: pending  Disposition: critically ill w/ acute on chronic resp failure treat as AECOPD. Hope we can rapidly wean. Need goals of care. ? Transplant candidate   Labs   CBC: No results for input(s): WBC, NEUTROABS, HGB, HCT, MCV, PLT in the last 168 hours.  Basic Metabolic Panel: No results for input(s): NA, K, CL, CO2, GLUCOSE, BUN, CREATININE, CALCIUM, MG, PHOS in the last 168 hours. GFR: CrCl cannot be calculated (Patient's most recent lab result is older than the maximum 21 days allowed.). No results for input(s): PROCALCITON, WBC, LATICACIDVEN in the last 168 hours.  Liver Function Tests: No results for input(s): AST, ALT, ALKPHOS, BILITOT, PROT, ALBUMIN in the last 168 hours. No results for input(s): LIPASE, AMYLASE in the last 168 hours. No results for input(s): AMMONIA in the last 168 hours.  ABG    Component Value Date/Time   PHART 7.459 (H) 03/23/2019 2008   PCO2ART 51.0 (H) 03/23/2019 2008   PO2ART 50.0 (L) 03/23/2019 2008   HCO3 36.2 (H) 03/23/2019 2008   TCO2 38 (H) 03/23/2019 2008   O2SAT 86.0 03/23/2019 2008     Coagulation Profile: No results for input(s): INR, PROTIME in the last 168 hours.  Cardiac Enzymes: No results for input(s): CKTOTAL, CKMB, CKMBINDEX, TROPONINI in the last 168 hours.  HbA1C: Hgb A1c MFr Bld  Date/Time Value Ref Range Status  03/23/2019 05:38 PM 5.6 4.8 - 5.6 % Final    Comment:    (NOTE) Pre diabetes:          5.7%-6.4% Diabetes:              >6.4% Glycemic control for   <7.0% adults with diabetes     CBG: No results for input(s): GLUCAP in the last 168 hours.  Review of Systems:   Not able   Past Medical History  She,  has no past medical history on file.   Surgical History   No past surgical history on  file.   Social History   reports that she has been smoking cigarettes. She has a 72.00 pack-year smoking history. She has quit using smokeless tobacco.   Family History   Her family history is not on file.   Allergies Allergies  Allergen Reactions  . Ceftriaxone Anaphylaxis  . Pregabalin Other (See Comments)    Pt. Experienced joint pain, depression, confusion Pt. Experienced joint pain, depression, confusion   . Codeine     REACTION: nausea  . Itraconazole     REACTION: diarhea and nausea  . Sulfamethoxazole     REACTION: diarhea and nauseted     Home Medications  Prior to Admission medications   Medication Sig Start Date End Date Taking? Authorizing Provider  acetaminophen (TYLENOL) 325 MG tablet Take 2 tablets (650 mg total) by mouth every 4 (four) hours as needed for mild pain (temp > 101.5). 03/30/19   Domenic Polite, MD  Cholecalciferol (VITAMIN D-1000 MAX ST) 1000 units tablet Take 1,000 Units by mouth daily.     [provider]  clonazePAM (KLONOPIN) 2 MG tablet Take 0.5-1 tablets (1-2 mg total) by mouth 2 (two) times daily. Take 1mg  in  am and 2mg  in pm 03/30/19   Domenic Polite, MD  esomeprazole (NEXIUM) 40 MG capsule Take 40 mg by mouth daily.  09/16/17   [provider]  feeding supplement (BOOST HIGH PROTEIN) LIQD Take 1 Container by mouth 2 (two) times daily between meals.    [provider]  fluticasone (FLONASE) 50 MCG/ACT nasal spray Place 1 spray into both nostrils daily.  10/04/17   [provider]  fluticasone furoate-vilanterol (BREO ELLIPTA) 100-25 MCG/INH AEPB Inhale 1 puff into the lungs daily. 09/16/17   [provider]  Fluticasone-Umeclidin-Vilant (TRELEGY ELLIPTA) 100-62.5-25 MCG/INH AEPB Inhale 1 puff into the lungs daily. Patient not taking: Reported on 03/25/2019 11/26/17   Tanda Rockers, MD  furosemide (LASIX) 20 MG tablet Take 1 tablet (20 mg total) by mouth daily. 03/30/19   Domenic Polite, MD  gabapentin  (NEURONTIN) 300 MG capsule Take 300 mg by mouth 3 (three) times daily.  09/21/17   [provider]  ipratropium-albuterol (DUONEB) 0.5-2.5 (3) MG/3ML SOLN Take 3 mLs by nebulization every 6 (six) hours.    [provider]  Melatonin 5 MG TABS Take 5 mg by mouth at bedtime.    [provider]  ondansetron (ZOFRAN) 4 MG tablet Take 4-8 mg by mouth every 8 (eight) hours as needed for nausea or vomiting.    [provider]  OXYGEN Place 2-3 L into the nose See admin instructions. 2lpm with rest and 3lpm with exertion     [provider]  PROAIR HFA 108 (90 Base) MCG/ACT inhaler Inhale 2 puffs into the lungs every 6 (six) hours as needed for wheezing or shortness of breath.  09/16/17   [provider]  venlafaxine XR (EFFEXOR-XR) 75 MG 24 hr capsule Take 75 mg by mouth daily with breakfast.  09/16/17   [provider]     Critical care time: 36 min       Erick Colace ACNP-BC [place] Pager # [phone] OR # [phone] if no answer

## 2019-05-20 NOTE — Progress Notes (Signed)
Pt arrived on unit w/ 262ml bag of fentanyl.  Fentanyl gtt d/c'd. 184ml of fentanyl wasted in steri cycle bin. Cathie Beams, RN witnessed.

## 2019-05-21 ENCOUNTER — Inpatient Hospital Stay (HOSPITAL_COMMUNITY): Payer: Medicare Other

## 2019-05-21 DIAGNOSIS — J9621 Acute and chronic respiratory failure with hypoxia: Principal | ICD-10-CM

## 2019-05-21 DIAGNOSIS — Z978 Presence of other specified devices: Secondary | ICD-10-CM

## 2019-05-21 DIAGNOSIS — Z9911 Dependence on respirator [ventilator] status: Secondary | ICD-10-CM

## 2019-05-21 DIAGNOSIS — J9622 Acute and chronic respiratory failure with hypercapnia: Secondary | ICD-10-CM

## 2019-05-21 DIAGNOSIS — J849 Interstitial pulmonary disease, unspecified: Secondary | ICD-10-CM

## 2019-05-21 LAB — CBC WITH DIFFERENTIAL/PLATELET
Abs Immature Granulocytes: 0.07 10*3/uL (ref 0.00–0.07)
Basophils Absolute: 0 10*3/uL (ref 0.0–0.1)
Basophils Relative: 0 %
Eosinophils Absolute: 0 10*3/uL (ref 0.0–0.5)
Eosinophils Relative: 0 %
HCT: 25.1 % — ABNORMAL LOW (ref 36.0–46.0)
Hemoglobin: 7.3 g/dL — ABNORMAL LOW (ref 12.0–15.0)
Immature Granulocytes: 1 %
Lymphocytes Relative: 4 %
Lymphs Abs: 0.4 10*3/uL — ABNORMAL LOW (ref 0.7–4.0)
MCH: 30 pg (ref 26.0–34.0)
MCHC: 29.1 g/dL — ABNORMAL LOW (ref 30.0–36.0)
MCV: 103.3 fL — ABNORMAL HIGH (ref 80.0–100.0)
Monocytes Absolute: 0.4 10*3/uL (ref 0.1–1.0)
Monocytes Relative: 4 %
Neutro Abs: 8.6 10*3/uL — ABNORMAL HIGH (ref 1.7–7.7)
Neutrophils Relative %: 91 %
Platelets: 314 10*3/uL (ref 150–400)
RBC: 2.43 MIL/uL — ABNORMAL LOW (ref 3.87–5.11)
RDW: 17.7 % — ABNORMAL HIGH (ref 11.5–15.5)
WBC: 9.4 10*3/uL (ref 4.0–10.5)
nRBC: 0.2 % (ref 0.0–0.2)

## 2019-05-21 LAB — BLOOD GAS, ARTERIAL
Acid-Base Excess: 10.4 mmol/L — ABNORMAL HIGH (ref 0.0–2.0)
Bicarbonate: 36.2 mmol/L — ABNORMAL HIGH (ref 20.0–28.0)
Drawn by: 33100
FIO2: 40
O2 Saturation: 96.5 %
PEEP: 8 cmH2O
Patient temperature: 99.3
Pressure control: 30 cmH2O
RATE: 24 resp/min
pCO2 arterial: 67.7 mmHg (ref 32.0–48.0)
pH, Arterial: 7.349 — ABNORMAL LOW (ref 7.350–7.450)
pO2, Arterial: 89.9 mmHg (ref 83.0–108.0)

## 2019-05-21 LAB — BASIC METABOLIC PANEL
Anion gap: 7 (ref 5–15)
BUN: 13 mg/dL (ref 6–20)
CO2: 35 mmol/L — ABNORMAL HIGH (ref 22–32)
Calcium: 7 mg/dL — ABNORMAL LOW (ref 8.9–10.3)
Chloride: 92 mmol/L — ABNORMAL LOW (ref 98–111)
Creatinine, Ser: 0.59 mg/dL (ref 0.44–1.00)
GFR calc Af Amer: >60 mL/min (ref >60)
GFR calc non Af Amer: >60 mL/min (ref >60)
Glucose, Bld: 120 mg/dL — ABNORMAL HIGH (ref 70–99)
Potassium: 3.6 mmol/L (ref 3.5–5.1)
Sodium: 134 mmol/L — ABNORMAL LOW (ref 135–145)

## 2019-05-21 LAB — PHOSPHORUS: Phosphorus: 1.5 mg/dL — ABNORMAL LOW (ref 2.5–4.6)

## 2019-05-21 LAB — GLUCOSE, CAPILLARY
Glucose-Capillary: 116 mg/dL — ABNORMAL HIGH (ref 70–99)
Glucose-Capillary: 150 mg/dL — ABNORMAL HIGH (ref 70–99)
Glucose-Capillary: 117 mg/dL — ABNORMAL HIGH (ref 70–99)
Glucose-Capillary: 140 mg/dL — ABNORMAL HIGH (ref 70–99)
Glucose-Capillary: 161 mg/dL — ABNORMAL HIGH (ref 70–99)
Glucose-Capillary: 195 mg/dL — ABNORMAL HIGH (ref 70–99)

## 2019-05-21 LAB — HIV ANTIBODY (ROUTINE TESTING W REFLEX): HIV Screen 4th Generation wRfx: NONREACTIVE

## 2019-05-21 LAB — MAGNESIUM: Magnesium: 2.1 mg/dL (ref 1.7–2.4)

## 2019-05-21 MED ORDER — VITAL HIGH PROTEIN PO LIQD
1000.0000 mL | ORAL | Status: DC
  Administered 2019-05-21: 16:00:00 1000 mL

## 2019-05-21 MED ORDER — PRO-STAT SUGAR FREE PO LIQD
30.0000 mL | Freq: Two times a day (BID) | ORAL | Status: DC
  Administered 2019-05-21 – 2019-05-22 (×3): 30 mL
  Filled 2019-05-21 (×3): qty 30

## 2019-05-21 MED ORDER — IPRATROPIUM-ALBUTEROL 0.5-2.5 (3) MG/3ML IN SOLN
3.0000 mL | RESPIRATORY_TRACT | Status: DC
  Administered 2019-05-21 – 2019-05-26 (×36): 3 mL via RESPIRATORY_TRACT
  Filled 2019-05-21 (×10): qty 3
  Filled 2019-05-21: qty 36
  Filled 2019-05-21 (×24): qty 3

## 2019-05-21 MED ORDER — MAGNESIUM SULFATE 2 GM/50ML IV SOLN
2.0000 g | Freq: Once | INTRAVENOUS | Status: AC
  Administered 2019-05-21: 2 g via INTRAVENOUS
  Filled 2019-05-21: qty 50

## 2019-05-21 MED ORDER — BUDESONIDE 0.5 MG/2ML IN SUSP
0.5000 mg | Freq: Two times a day (BID) | RESPIRATORY_TRACT | Status: DC
  Administered 2019-05-21 – 2019-06-08 (×36): 0.5 mg via RESPIRATORY_TRACT
  Filled 2019-05-21 (×37): qty 2

## 2019-05-21 MED ORDER — MIDAZOLAM HCL 2 MG/2ML IJ SOLN
1.0000 mg | INTRAMUSCULAR | Status: DC | PRN
  Administered 2019-05-21 – 2019-05-23 (×3): 1 mg via INTRAVENOUS
  Filled 2019-05-21 (×3): qty 2

## 2019-05-21 NOTE — Progress Notes (Signed)
NAME:  Krystal Cantu, MRN:  [id number], DOB:  1960/09/05, LOS: 1 ADMISSION DATE:  05/20/2019, CONSULTATION DATE:  9/26 REFERRING MD:  [facility] , CHIEF COMPLAINT:  Acute on chronic resp failure    Brief History   58 year old female admitted with acute on chronic hypoxic and hypercarbic respiratory failure requiring mechanical ventilation on 9/26.  History of present illness   This is a 58 year old female patient with a complicated pulmonary history as mentioned below followed by Dr. Melvyn Novas  in our clinic.  Last hospitalized in August 2020 for acute on chronic respiratory failure felt  Presents to the emergency room via EMS after being found at home unresponsive on 9/26.  Apparently had been in normal state of health the night prior.  On EMS arrival Glasgow Coma Scale was 3 with pulse oximetry at 44% this improved with saturations up to 97% on supplemental oxygen but she remained unresponsive, did not improve with BiPAP so was placed on mechanical ventilator and subsequently transferred to [facility] for further evaluation.  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  Known significant component of restrictive lung disease superimposed on underlying COPD Due to COPD exacerbation this did require mechanical ventilation.   Significant Hospital Events     Consults:  Palliative care  Procedures:  Endotracheal tube 9/26  Significant Diagnostic Tests:   03/24/2019: ECHO IMPRESSIONS  1. The left ventricle has mildly reduced systolic function, with an ejection fraction of 45-50%. The cavity size was  normal. Left ventricular diastolic Doppler parameters are consistent with pseudonormalization. Left ventrical global hypokinesis without  regional wall motion abnormalities.  2. The right ventricle has mildly reduced systolic function. The cavity was normal. There is no increase in right ventricular wall thickness. Right ventricular systolic pressure is mildly elevated with an estimated pressure of 43.5 mmHg.  3. Left atrial size was not assessed.  4. Right atrial size was mildly dilated.  5. Mitral valve regurgitation is moderate by color flow Doppler.  6. The aortic valve has an indeterminate number of cusps. Mild thickening of the aortic valve. Mild calcification of the aortic valve. Aortic valve regurgitation is moderate by color flow Doppler.  7. The inferior vena cava was dilated in size with <50% respiratory variability.   Micro Data:  Respiratory culture 9/26>>>  Antimicrobials:  azith 9/26>>>  Interim history/subjective:   Intubated on mechanical life support, critically ill.  Awake alert able to follow some commands even though on mechanical support.  Nods head yes no to questions.  Objective   Blood pressure 96/66, pulse 79, temperature 99.5 F (37.5 C), resp. rate (!) 23, height 5\' 1"  (1.549 m), weight 48.1 kg, SpO2 100 %.    Vent Mode: PCV FiO2 (%):  [40 %-50 %] 40 % Set Rate:  [24 bmp] 24 bmp PEEP:  [8 cmH20] 8 cmH20 Plateau Pressure:  [34 cmH20-37 cmH20] 37 cmH20   Intake/Output Summary (Last 24 hours) at 05/21/2019 1048 Last data filed at 05/21/2019 1000 Gross per 24 hour  Intake 1804.72 ml  Output 1325 ml  Net 479.72 ml   Filed Weights   05/21/19 0500  Weight: 48.1 kg    Examination:  General: Intubated on mechanical life support, following commands HENT: NCAT, no JVD, tracking appropriately Lungs: Diffuse bilateral crackles, poor volumes and pressure support on mechanical ventilation Cardiovascular: Regular rate and rhythm S1-S2 Abdomen: Soft, nontender,  nondistended, bowel sounds present Extremities: No edema Neuro: Alert oriented following commands no focal deficit   Resolved Hospital Problem list    Assessment & Plan:   Acute on chronic hypoxemic hypercarbic respiratory failure Acute exacerbation of her underlying chronic respiratory disease. Appears to have smoking-related interstitial lung disease, COPD and prior diagnosis of Boop dating back from 2006. Severe restrictive lung disease at baseline. Disease state would be consist with end-stage fibrotic lung disease Likely secondary pulmonary hypertension to underlying lung disease, was on sildenafil in the past. Plan At this point we will have to continue full mechanical ventilatory support. Per documentation she was a DNR in the past. We have started goals of care discussions with the patient as well as patient's family. I plan to meet with the patient's brother this afternoon. Continue scheduled bronchodilators IV Solu-Medrol Continue azithromycin x5 days I suspect that the most difficult situation will be liberating her from the mechanical ventilator In pressure support trials this morning her volumes are just too small to sustain. I do not believe that she would be a good candidate for tracheostomy or permanent mechanical ventilation due to her severe end-stage disease at baseline. I have placed consult palliative care.  Hypotension, resolved Continue to observe  Hyperglycemia  Plan SSI   Best practice:  Diet: NPO Pain/Anxiety/Delirium protocol (if indicated): 9/26 Code Status: full code  DVT prophylaxis 9/26 SUP: PPI DM:SSI Family Communication: pending  Disposition: critically ill w/ acute on chronic resp failure treat as AECOPD. Hope we can rapidly wean. Need goals of care. ? Transplant candidate   Labs   CBC: Recent Labs  Lab 05/20/19 1502 05/20/19 1535 05/21/19 0547  WBC 11.8*  --  9.4  NEUTROABS 11.1*  --  8.6*  HGB 8.3* 8.8* 7.3*  HCT 28.2* 26.0*  25.1*  MCV 103.3*  --  103.3*  PLT 352  --  [id number]    Basic Metabolic Panel: Recent Labs  Lab 05/20/19 1502 05/20/19 1535 05/21/19 0547  NA 136 133* 134*  K 4.4 4.0 3.6  CL 91*  --  92*  CO2 35*  --  35*  GLUCOSE 147*  --  120*  BUN 15  --  13  CREATININE 0.73  --  0.59  CALCIUM 6.9*  --  7.0*  MG 2.3  --   --   PHOS 2.4*  --   --    GFR: Estimated Creatinine Clearance: 57.8 mL/min (by C-G formula based on SCr of 0.59 mg/dL). Recent Labs  Lab 05/20/19 1502 05/21/19 0547  PROCALCITON 1.46  --   WBC 11.8* 9.4    Liver Function Tests: Recent Labs  Lab 05/20/19 1502  AST 22  ALT 19  ALKPHOS 73  BILITOT 0.7  PROT 6.1*  ALBUMIN 2.3*   No results for input(s): LIPASE, AMYLASE in the last 168 hours. No results for input(s): AMMONIA in the last 168 hours.  ABG    Component Value Date/Time   PHART 7.349 (L) 05/21/2019 0509   PCO2ART 67.7 (HH) 05/21/2019 0509   PO2ART 89.9 05/21/2019 0509   HCO3 36.2 (H) 05/21/2019 0509   TCO2 41 (H) 05/20/2019 1535   O2SAT 96.5 05/21/2019 0509     Coagulation Profile: No results for input(s): INR, PROTIME in the last 168 hours.  Cardiac Enzymes: No results for input(s): CKTOTAL, CKMB, CKMBINDEX, TROPONINI in the last 168 hours.  HbA1C: Hgb A1c MFr Bld  Date/Time Value Ref Range Status  03/23/2019 05:38 PM 5.6 4.8 - 5.6 % Final    Comment:    (NOTE) Pre diabetes:          5.7%-6.4% Diabetes:              >6.4% Glycemic control for   <7.0% adults with diabetes     CBG: Recent Labs  Lab 05/20/19 1554 05/20/19 2037 05/20/19 2349 05/21/19 0435 05/21/19 0726  GLUCAP 143* 113* 144* 116* 150*    Review of Systems:   Not able   Past Medical History  She,  has no past medical history on file.   Surgical History   No past surgical history on file.   Social History   reports that she has been smoking cigarettes. She has a 72.00 pack-year smoking history. She has quit using smokeless tobacco.   Family History    Her family history is not on file.   Allergies Allergies  Allergen Reactions  . Ceftriaxone Anaphylaxis  . Pregabalin Other (See Comments)    Pt. Experienced joint pain, depression, confusion   . Codeine Nausea Only  . Itraconazole Diarrhea and Nausea Only  . Sulfamethoxazole Diarrhea and Nausea Only     Home Medications  Prior to Admission medications   Medication Sig Start Date End Date Taking? Authorizing Provider  acetaminophen (TYLENOL) 325 MG tablet Take 2 tablets (650 mg total) by mouth every 4 (four) hours as needed for mild pain (temp > 101.5). 03/30/19   Domenic Polite, MD  Cholecalciferol (VITAMIN D-1000 MAX ST) 1000 units tablet Take 1,000 Units by mouth daily.     [provider]  clonazePAM (KLONOPIN) 2 MG tablet Take 0.5-1 tablets (1-2 mg total) by mouth 2 (two) times daily. Take 1mg  in am and 2mg  in pm 03/30/19   Domenic Polite, MD  esomeprazole (NEXIUM) 40 MG capsule Take 40 mg by mouth daily.  09/16/17   [provider]  feeding supplement (BOOST HIGH PROTEIN) LIQD Take 1 Container by mouth 2 (two) times daily between meals.    [provider]  fluticasone (FLONASE) 50 MCG/ACT nasal spray Place 1 spray into both nostrils daily.  10/04/17   [provider]  fluticasone furoate-vilanterol (BREO ELLIPTA) 100-25 MCG/INH AEPB Inhale 1 puff into the lungs daily. 09/16/17   [provider]  Fluticasone-Umeclidin-Vilant (TRELEGY ELLIPTA) 100-62.5-25 MCG/INH AEPB Inhale 1 puff into the lungs daily. Patient not taking: Reported on 03/25/2019 11/26/17   Tanda Rockers, MD  furosemide (LASIX) 20 MG tablet Take 1 tablet (20 mg total) by mouth daily. 03/30/19   Domenic Polite, MD  gabapentin (NEURONTIN) 300 MG capsule Take 300 mg by mouth 3 (three) times daily.  09/21/17   [provider]  ipratropium-albuterol (DUONEB) 0.5-2.5 (3) MG/3ML SOLN Take 3 mLs by nebulization every 6 (six) hours.    [provider]  Melatonin 5 MG TABS  Take 5 mg by mouth at bedtime.    [provider]  ondansetron (ZOFRAN) 4 MG tablet Take 4-8 mg by mouth every 8 (eight) hours as needed for nausea or vomiting.    [provider]  OXYGEN Place 2-3 L into the nose See admin instructions. 2lpm with rest and 3lpm with exertion     [provider]  PROAIR HFA 108 (90 Base) MCG/ACT inhaler Inhale 2 puffs into the lungs  every 6 (six) hours as needed for wheezing or shortness of breath.  09/16/17   [provider]  venlafaxine XR (EFFEXOR-XR) 75 MG 24 hr capsule Take 75 mg by mouth daily with breakfast.  09/16/17   [provider]     This patient is critically ill with multiple organ system failure; which, requires frequent high complexity decision making, assessment, support, evaluation, and titration of therapies. This was completed through the application of advanced monitoring technologies and extensive interpretation of multiple databases. During this encounter critical care time was devoted to patient care services described in this note for 33 minutes.   Garner Nash, DO [facility] 05/21/2019 10:48 AM  Personal pager: [phone] If unanswered, please page CCM On-call: [phone]

## 2019-05-21 NOTE — Progress Notes (Signed)
[facility] Progress Note Patient Name: Doralene Foxworthy DOB: 04-27-1961 MRN: [id number]   Date of Service  05/21/2019  HPI/Events of Note  RT- ABG reviewed. Stable type 2 resp failure on PCV 25, rate 24.   eICU Interventions  Continue same.      Intervention Category Intermediate Interventions: Other:  Elmer Sow 05/21/2019, 5:24 AM

## 2019-05-21 NOTE — Progress Notes (Signed)
[facility] Progress Note Patient Name: Krystal Cantu DOB: 1961-02-11 MRN: [id number]   Date of Service  05/21/2019  HPI/Events of Note  Asking for AM labs. On Vent/COPD exacerbation. Labs reviewed.   eICU Interventions  ABG, BMP, CBC ordered     Intervention Category Minor Interventions: Routine modifications to care plan (e.g. PRN medications for pain, fever)  Elmer Sow 05/21/2019, 2:51 AM

## 2019-05-21 NOTE — Progress Notes (Signed)
PCCM:  Case discussed with colleague, Dr. Chase Caller our ILD Clinic Director. We will obtain a HRCT for further prognostication and evaluation of any presence of honey combing. We appreciate all the input and help with this case.   Garner Nash, DO [facility] 05/21/2019 4:51 PM

## 2019-05-22 ENCOUNTER — Inpatient Hospital Stay (HOSPITAL_COMMUNITY): Payer: Medicare Other

## 2019-05-22 DIAGNOSIS — Z7189 Other specified counseling: Secondary | ICD-10-CM

## 2019-05-22 DIAGNOSIS — J9601 Acute respiratory failure with hypoxia: Secondary | ICD-10-CM

## 2019-05-22 DIAGNOSIS — J8410 Pulmonary fibrosis, unspecified: Secondary | ICD-10-CM

## 2019-05-22 DIAGNOSIS — Z515 Encounter for palliative care: Secondary | ICD-10-CM

## 2019-05-22 LAB — CBC
HCT: 22.9 % — ABNORMAL LOW (ref 36.0–46.0)
Hemoglobin: 7.1 g/dL — ABNORMAL LOW (ref 12.0–15.0)
MCH: 30.5 pg (ref 26.0–34.0)
MCHC: 31 g/dL (ref 30.0–36.0)
MCV: 98.3 fL (ref 80.0–100.0)
Platelets: 277 10*3/uL (ref 150–400)
RBC: 2.33 MIL/uL — ABNORMAL LOW (ref 3.87–5.11)
RDW: 18.3 % — ABNORMAL HIGH (ref 11.5–15.5)
WBC: 5.8 10*3/uL (ref 4.0–10.5)
nRBC: 0.7 % — ABNORMAL HIGH (ref 0.0–0.2)

## 2019-05-22 LAB — GLUCOSE, CAPILLARY
Glucose-Capillary: 164 mg/dL — ABNORMAL HIGH (ref 70–99)
Glucose-Capillary: 200 mg/dL — ABNORMAL HIGH (ref 70–99)
Glucose-Capillary: 194 mg/dL — ABNORMAL HIGH (ref 70–99)
Glucose-Capillary: 168 mg/dL — ABNORMAL HIGH (ref 70–99)
Glucose-Capillary: 139 mg/dL — ABNORMAL HIGH (ref 70–99)

## 2019-05-22 LAB — BASIC METABOLIC PANEL
Anion gap: 7 (ref 5–15)
BUN: 30 mg/dL — ABNORMAL HIGH (ref 6–20)
CO2: 34 mmol/L — ABNORMAL HIGH (ref 22–32)
Calcium: 7.1 mg/dL — ABNORMAL LOW (ref 8.9–10.3)
Chloride: 94 mmol/L — ABNORMAL LOW (ref 98–111)
Creatinine, Ser: 0.56 mg/dL (ref 0.44–1.00)
GFR calc Af Amer: >60 mL/min (ref >60)
GFR calc non Af Amer: >60 mL/min (ref >60)
Glucose, Bld: 162 mg/dL — ABNORMAL HIGH (ref 70–99)
Potassium: 3.2 mmol/L — ABNORMAL LOW (ref 3.5–5.1)
Sodium: 135 mmol/L (ref 135–145)

## 2019-05-22 LAB — PHOSPHORUS
Phosphorus: 1.4 mg/dL — ABNORMAL LOW (ref 2.5–4.6)
Phosphorus: 2.4 mg/dL — ABNORMAL LOW (ref 2.5–4.6)

## 2019-05-22 LAB — MAGNESIUM
Magnesium: 2.8 mg/dL — ABNORMAL HIGH (ref 1.7–2.4)
Magnesium: 2.2 mg/dL (ref 1.7–2.4)

## 2019-05-22 MED ORDER — POTASSIUM CHLORIDE 20 MEQ/15ML (10%) PO SOLN
20.0000 meq | Freq: Once | ORAL | Status: AC
  Administered 2019-05-22: 10:00:00 20 meq
  Filled 2019-05-22: qty 15

## 2019-05-22 MED ORDER — POTASSIUM PHOSPHATES 15 MMOLE/5ML IV SOLN
20.0000 mmol | Freq: Once | INTRAVENOUS | Status: AC
  Administered 2019-05-22: 10:00:00 20 mmol via INTRAVENOUS
  Filled 2019-05-22: qty 6.67

## 2019-05-22 MED ORDER — VITAL AF 1.2 CAL PO LIQD
1000.0000 mL | ORAL | Status: DC
  Administered 2019-05-22: 16:00:00 1000 mL
  Filled 2019-05-22: qty 1000

## 2019-05-22 MED ORDER — LIDOCAINE VISCOUS HCL 2 % MT SOLN
15.0000 mL | OROMUCOSAL | Status: DC | PRN
  Administered 2019-05-22 – 2019-06-02 (×3): 15 mL via OROMUCOSAL
  Filled 2019-05-22 (×3): qty 15

## 2019-05-22 MED ORDER — METHYLPREDNISOLONE SODIUM SUCC 125 MG IJ SOLR
60.0000 mg | Freq: Two times a day (BID) | INTRAMUSCULAR | Status: DC
  Administered 2019-05-22 – 2019-05-24 (×4): 60 mg via INTRAVENOUS
  Filled 2019-05-22 (×4): qty 2

## 2019-05-22 NOTE — Consult Note (Signed)
Consultation Note Date: 05/22/2019   Patient Name: Krystal Cantu  DOB: 1961/05/08  MRN: [id number]  Age / Sex: 58 y.o., female  PCP: Bonnita Nasuti, MD Referring Physician: Candee Furbish, MD  Reason for Consultation: Establishing goals of care and Psychosocial/spiritual support  HPI/Patient Profile: 58 y.o. female admitted on 05/20/2019 with a past medical h/o recurrent hypercarbic respiratory failure related to advanced lung disease/COPD vs. ILD and thoracic cage deformity from kyphosis.   Last hospitalized in August 2020 for acute on chronic respiratory failure.  Currently intubated.  Patient faces treatment option decisions, advanced directive decisions and anticipatory care needs.   Clinical Assessment and Goals of Care:   This NP Wadie Lessen reviewed medical records, received report from team, assessed the patient and then meet at the patient's bedside to discuss diagnosis, prognosis, GOC,  disposition and options.       Concept of Palliative Care were discussed.   A discussion was had today regarding advanced directives.  Concepts specific to code status,was had.  The difference between a aggressive medical intervention path  and a palliative comfort care path for this patient at this time was had.  We discussed specifically the "what if's" of trachiostomy and long term vent dependence  Patient is able to communicate, but it is difficult due to the fact that she is intubated and must write down her questions and responses on pad of paper  I also spoke by phone with the patient's daughter/Rebecca and her brother Octavia Bruckner.  Plan is to meet tomorrow at noon at the bedside for a family conversation regarding the above decisions    Values and goals of care important to patient and family were attempted to be elicited.    Questions and concerns addressed.   Family encouraged to call with questions or  concerns.    PMT will continue to support holistically.    SUMMARY OF RECOMMENDATIONS    Code Status/Advance Care Planning:  Full code  Palliative Prophylaxis:   Aspiration, Bowel Regimen, Delirium Protocol and Oral Care  Additional Recommendations (Limitations, Scope, Preferences):  Full Scope Treatment  Psycho-social/Spiritual:   Desire for further Chaplaincy support:yes  Additional Recommendations: Created space and opportunity for patient to explore her thoughts and feelings regarding her current medical situation.  Her significant end-stage lung disease is positioning her to face her own human mortality and the limitations of medical interventions to prolong quality of life when the body begins to fail to thrive.  Prognosis:   Unable to determine-dependent on patient's desire for life prolonging measures  Discharge Planning: To Be Determined      Primary Diagnoses: Present on Admission: . COPD exacerbation ([place])   I have reviewed the medical record, interviewed the patient and family, and examined the patient. The following aspects are pertinent.  No past medical history on file. Social History   Socioeconomic History  . Marital status: Divorced    Spouse name: Not on file  . Number of children: Not on file  . Years of education: Not  on file  . Highest education level: Not on file  Occupational History  . Not on file  Social Needs  . Financial resource strain: Not on file  . Food insecurity    Worry: Not on file    Inability: Not on file  . Transportation needs    Medical: Not on file    Non-medical: Not on file  Tobacco Use  . Smoking status: Current Some Day Smoker    Packs/day: 2.00    Years: 36.00    Pack years: 72.00    Types: Cigarettes  . Smokeless tobacco: Former Network engineer and Sexual Activity  . Alcohol use: Not on file  . Drug use: Not on file  . Sexual activity: Not on file  Lifestyle  . Physical activity    Days per week:  Not on file    Minutes per session: Not on file  . Stress: Not on file  Relationships  . Social Herbalist on phone: Not on file    Gets together: Not on file    Attends religious service: Not on file    Active member of club or organization: Not on file    Attends meetings of clubs or organizations: Not on file    Relationship status: Not on file  Other Topics Concern  . Not on file  Social History Narrative  . Not on file   No family history on file. Scheduled Meds: . ALPRAZolam  1 mg Oral BID  . budesonide (PULMICORT) nebulizer solution  0.5 mg Nebulization BID  . chlorhexidine gluconate (MEDLINE KIT)  15 mL Mouth Rinse BID  . Chlorhexidine Gluconate Cloth  6 each Topical Daily  . enoxaparin (LOVENOX) injection  40 mg Subcutaneous Q24H  . feeding supplement (PRO-STAT SUGAR FREE 64)  30 mL Per Tube BID  . feeding supplement (VITAL HIGH PROTEIN)  1,000 mL Per Tube Q24H  . gabapentin  300 mg Oral Q8H  . insulin aspart  0-15 Units Subcutaneous Q4H  . ipratropium-albuterol  3 mL Nebulization Q4H  . mouth rinse  15 mL Mouth Rinse 10 times per day  . Melatonin  3 mg Per Tube QHS  . methylPREDNISolone (SOLU-MEDROL) injection  60 mg Intravenous Q12H  . pantoprazole sodium  40 mg Per Tube Q1200  . potassium chloride  20 mEq Per Tube Once  . venlafaxine  75 mg Oral BID WC   Continuous Infusions: . sodium chloride 10 mL/hr at 05/22/19 0700  . azithromycin 500 mg (05/21/19 1528)  . potassium PHOSPHATE IVPB (in mmol)     PRN Meds:.fentaNYL (SUBLIMAZE) injection, midazolam Medications Prior to Admission:  Prior to Admission medications   Medication Sig Start Date End Date Taking? Authorizing Provider  atorvastatin (LIPITOR) 20 MG tablet Take 20 mg by mouth at bedtime.   Yes [provider]  Calcium Carbonate (CALCIUM 600 PO) Take 600 mg by mouth daily.   Yes [provider]  Cholecalciferol (VITAMIN D-3) 25 MCG (1000 UT) CAPS Take 1,000 Units by mouth  daily.   Yes [provider]  clonazePAM (KLONOPIN) 1 MG tablet Take 1 mg by mouth 2 (two) times daily.   Yes [provider]  esomeprazole (NEXIUM) 40 MG capsule Take 40 mg by mouth daily.  09/16/17  Yes [provider]  fluticasone (FLONASE) 50 MCG/ACT nasal spray Place 1 spray into both nostrils daily.  10/04/17  Yes [provider]  fluticasone furoate-vilanterol (BREO ELLIPTA) 100-25 MCG/INH AEPB Inhale 1 puff  into the lungs at bedtime.  09/16/17  Yes [provider]  gabapentin (NEURONTIN) 300 MG capsule Take 300 mg by mouth 3 (three) times daily.  09/21/17  Yes [provider]  ipratropium-albuterol (DUONEB) 0.5-2.5 (3) MG/3ML SOLN Take 3 mLs by nebulization 4 (four) times daily.    Yes [provider]  Melatonin 5 MG TABS Take 5 mg by mouth at bedtime.   Yes [provider]  OXYGEN Place 2-3 L/min into the nose continuous.    Yes [provider]  potassium chloride SA (K-DUR) 20 MEQ tablet Take 20 mEq by mouth 2 (two) times daily.   Yes [provider]  PROAIR HFA 108 [phone] Base) MCG/ACT inhaler Inhale 2 puffs into the lungs every 6 (six) hours as needed for wheezing or shortness of breath.  09/16/17  Yes [provider]  sildenafil (REVATIO) 20 MG tablet Take 20 mg by mouth 3 (three) times daily.   Yes [provider]  venlafaxine (EFFEXOR) 75 MG tablet Take 75 mg by mouth daily.   Yes [provider]  acetaminophen (TYLENOL) 325 MG tablet Take 2 tablets (650 mg total) by mouth every 4 (four) hours as needed for mild pain (temp > 101.5). 03/30/19   Domenic Polite, MD  clonazePAM (KLONOPIN) 2 MG tablet Take 0.5-1 tablets (1-2 mg total) by mouth 2 (two) times daily. Take 27m in am and 274min pm Patient not taking: Reported on 05/20/2019 03/30/19   JoDomenic PoliteMD  feeding supplement (BOOST HIGH PROTEIN) LIQD Take 1 Container by mouth 2 (two) times daily between meals.    [provider]  Fluticasone-Umeclidin-Vilant (TRELEGY ELLIPTA) 100-62.5-25 MCG/INH AEPB Inhale 1 puff into the lungs daily. 11/26/17   WeTanda RockersMD  furosemide (LASIX) 20 MG tablet Take 1 tablet (20 mg total) by mouth daily. 03/30/19   JoDomenic PoliteMD  ondansetron (ZOFRAN) 4 MG tablet Take 4-8 mg by mouth every 8 (eight) hours as needed for nausea or vomiting.    [provider]   Allergies  Allergen Reactions  . Ceftriaxone Anaphylaxis  . Pregabalin Other (See Comments)    Pt. Experienced joint pain, depression, confusion   . Codeine Nausea Only  . Itraconazole Diarrhea and Nausea Only  . Sulfamethoxazole Diarrhea and Nausea Only   Review of Systems  Constitutional: Positive for fatigue.  Respiratory: Positive for shortness of breath.   Neurological: Positive for weakness.    Physical Exam Constitutional:      Appearance: She is underweight.     Interventions: She is intubated.  Cardiovascular:     Rate and Rhythm: Normal rate and regular rhythm.  Pulmonary:     Effort: She is intubated.  Skin:    General: Skin is warm and dry.  Neurological:     Mental Status: She is alert and oriented to person, place, and time.     Vital Signs: BP 104/66   Pulse 95   Temp 99.1 F (37.3 C)   Resp (!) 9   Ht _0  (1.549 m)   Wt 45.6 kg   SpO2 94%   BMI 18.99 kg/m  Pain Scale: CPOT       SpO2: SpO2: 94 % O2 Device:SpO2: 94 % O2 Flow Rate: .   IO: Intake/output summary:   Intake/Output Summary (Last 24 hours) at 05/22/2019 0852 Last data filed at 05/22/2019 0700 Gross per 24 hour  Intake 1472.28 ml  Output 1375 ml  Net 97.28 ml  LBM: Last BM Date: (PTA) Baseline Weight: Weight: 48.1 kg Most recent weight: Weight: 45.6 kg     Palliative Assessment/Data: 30 % at best    Discussed with Dr Tamala Julian and Dr Valeta Harms  Time In: 0915 Time Out: 1025 Time Total: 70 minutes Greater than 50%  of this time was spent counseling and coordinating care related to  the above assessment and plan.  Signed by: Wadie Lessen, NP   Please contact Palliative Medicine Team phone at [phone] for questions and concerns.  For individual provider: See Shea Evans

## 2019-05-22 NOTE — Progress Notes (Signed)
NAME:  Krystal Cantu, MRN:  [id number], DOB:  Aug 19, 1961, LOS: 2 ADMISSION DATE:  05/20/2019, CONSULTATION DATE:  9/26 REFERRING MD:  [facility] , CHIEF COMPLAINT:  Acute on chronic resp failure    Brief History   58 year old female admitted with acute on chronic hypoxic and hypercarbic respiratory failure requiring mechanical ventilation on 9/26.  History of present illness   This is a 58 year old female patient with a complicated pulmonary history as mentioned below followed by Dr. Melvyn Novas  in our clinic.  Last hospitalized in August 2020 for acute on chronic respiratory failure felt  Presents to the emergency room via EMS after being found at home unresponsive on 9/26.  Apparently had been in normal state of health the night prior.  On EMS arrival Glasgow Coma Scale was 3 with pulse oximetry at 44% this improved with saturations up to 97% on supplemental oxygen but she remained unresponsive, did not improve with BiPAP so was placed on mechanical ventilator and subsequently transferred to [facility] for further evaluation.  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  Known significant component of restrictive lung disease superimposed on underlying COPD Due to COPD exacerbation this did require mechanical ventilation.   Significant Hospital Events     Consults:  Palliative care  Procedures:  Endotracheal tube 9/26  Significant Diagnostic Tests:   03/24/2019: ECHO IMPRESSIONS  1. The left ventricle has mildly reduced systolic function, with an ejection fraction of 45-50%. The cavity size was  normal. Left ventricular diastolic Doppler parameters are consistent with pseudonormalization. Left ventrical global hypokinesis without  regional wall motion abnormalities.  2. The right ventricle has mildly reduced systolic function. The cavity was normal. There is no increase in right ventricular wall thickness. Right ventricular systolic pressure is mildly elevated with an estimated pressure of 43.5 mmHg.  3. Left atrial size was not assessed.  4. Right atrial size was mildly dilated.  5. Mitral valve regurgitation is moderate by color flow Doppler.  6. The aortic valve has an indeterminate number of cusps. Mild thickening of the aortic valve. Mild calcification of the aortic valve. Aortic valve regurgitation is moderate by color flow Doppler.  7. The inferior vena cava was dilated in size with <50% respiratory variability.   Micro Data:  Respiratory culture 9/26>>>  Antimicrobials:  azith 9/26>>>  Interim history/subjective:   Intubated on mechanical life support critically ill.  Awake alert following commands able to communicate with handwriting via board.  Objective   Blood pressure 104/66, pulse 74, temperature 99 F (37.2 C), resp. rate (!) 24, height 5\' 1"  (1.549 m), weight 45.6 kg, SpO2 97 %.    Vent Mode: PCV FiO2 (%):  [30 %-40 %] 30 % Set Rate:  [24 bmp] 24 bmp PEEP:  [8 cmH20] 8 cmH20 Plateau Pressure:  [33 cmH20-37 cmH20] 33 cmH20   Intake/Output Summary (Last 24 hours) at 05/22/2019 0737 Last data filed at 05/22/2019 0700 Gross per 24 hour  Intake 1599.75 ml  Output 1585 ml  Net 14.75 ml   Filed Weights   05/21/19 0500 05/22/19 0425  Weight: 48.1 kg 45.6 kg    Examination: General: Elderly female, cachectic,  low muscle mass, critically ill intubated on mechanical life support HENT: See AT, no JVD, thin neck, tracking appropriately, endotracheal tube in place Lungs: Bilateral crackles, poor volumes, does not tolerate SBT this morning.  To low volumes and  high pressure support Cardiovascular: Regular rate and rhythm, S1-S2 Abdomen: Soft, nontender, nondistended, bowel sounds present Extremities: No edema Neuro: Alert oriented following commands no focal deficit moving all 4 extremities able to communicate via handwriting on pad while she is on ventilator.   Resolved Hospital Problem list    Assessment & Plan:   Acute on chronic hypoxemic hypercarbic respiratory failure Acute exacerbation of her underlying chronic respiratory disease. Appears to have h/o a smoking-related interstitial lung disease, COPD, and prior diagnosis of BOOP dating back from 2006. Severe restrictive lung disease at baseline. Disease state would be consist with end-stage fibrotic lung disease and DPLD  Likely secondary pulmonary hypertension to underlying lung disease, was on sildenafil in the past, which in her case could make v/q mismatch worse  I doubt she is a candidate for transplantation evaluation as she was smoking up until this admission  Plan HRCT ordered yesterday. Images reviewed this morning. Discussed results with patient at bedside. Continue full mechanical ventilatory support at this time. She is in very high need of pressure control. Pressure control of 30 to maintain a tidal volume in the mid 200s. We will also discuss goals of care with family again. We appreciate palliative care input.  Consultation pending Continue scheduled bronchodilators Continue IV Solu-Medrol Complete 5 days azithromycin I suspect that we should at least give a try at liberating her from the mechanical ventilator. My recommendation would be for a one-way extubation with no plans to reintubate. I am not sure that the patient and family yet understands the repercussions of her potentially requiring a tracheostomy tube and permanent mechanical ventilation due to her end-stage lung disease. We will continue to work with the patient and family regarding this.  Hypotension,  resolved Resolved continue to observe  Hyperglycemia  Plan SI   Best practice:  Diet: NPO Pain/Anxiety/Delirium protocol (if indicated): 9/26 Code Status: full code  DVT prophylaxis 9/26 SUP: PPI DM:SSI Family Communication: pending  Disposition:  Labs   CBC: Recent Labs  Lab 05/20/19 1502 05/20/19 1535 05/21/19 0547 05/22/19 0357  WBC 11.8*  --  9.4 5.8  NEUTROABS 11.1*  --  8.6*  --   HGB 8.3* 8.8* 7.3* 7.1*  HCT 28.2* 26.0* 25.1* 22.9*  MCV 103.3*  --  103.3* 98.3  PLT 352  --  314 [id number]    Basic Metabolic Panel: Recent Labs  Lab 05/20/19 1502 05/20/19 1535 05/21/19 0547 05/21/19 1408 05/22/19 0357  NA 136 133* 134*  --  135  K 4.4 4.0 3.6  --  3.2*  CL 91*  --  92*  --  94*  CO2 35*  --  35*  --  34*  GLUCOSE 147*  --  120*  --  162*  BUN 15  --  13  --  30*  CREATININE 0.73  --  0.59  --  0.56  CALCIUM 6.9*  --  7.0*  --  7.1*  MG 2.3  --   --  2.1 2.8*  PHOS 2.4*  --   --  1.5* 1.4*   GFR: Estimated Creatinine Clearance: 55.2 mL/min (by C-G formula based on SCr of 0.56 mg/dL). Recent Labs  Lab 05/20/19 1502 05/21/19 0547 05/22/19 0357  PROCALCITON 1.46  --   --  WBC 11.8* 9.4 5.8    Liver Function Tests: Recent Labs  Lab 05/20/19 1502  AST 22  ALT 19  ALKPHOS 73  BILITOT 0.7  PROT 6.1*  ALBUMIN 2.3*   No results for input(s): LIPASE, AMYLASE in the last 168 hours. No results for input(s): AMMONIA in the last 168 hours.  ABG    Component Value Date/Time   PHART 7.349 (L) 05/21/2019 0509   PCO2ART 67.7 (HH) 05/21/2019 0509   PO2ART 89.9 05/21/2019 0509   HCO3 36.2 (H) 05/21/2019 0509   TCO2 41 (H) 05/20/2019 1535   O2SAT 96.5 05/21/2019 0509     Coagulation Profile: No results for input(s): INR, PROTIME in the last 168 hours.  Cardiac Enzymes: No results for input(s): CKTOTAL, CKMB, CKMBINDEX, TROPONINI in the last 168 hours.  HbA1C: Hgb A1c MFr Bld  Date/Time Value Ref Range Status  03/23/2019 05:38 PM 5.6 4.8 - 5.6  % Final    Comment:    (NOTE) Pre diabetes:          5.7%-6.4% Diabetes:              >6.4% Glycemic control for   <7.0% adults with diabetes     CBG: Recent Labs  Lab 05/21/19 1215 05/21/19 1556 05/21/19 1947 05/21/19 2326 05/22/19 0328  GLUCAP 117* 140* 161* 195* 164*    Review of Systems:   Not able   Past Medical History  She,  has no past medical history on file.   Surgical History   No past surgical history on file.   Social History   reports that she has been smoking cigarettes. She has a 72.00 pack-year smoking history. She has quit using smokeless tobacco.   Family History   Her family history is not on file.   Allergies Allergies  Allergen Reactions   Ceftriaxone Anaphylaxis   Pregabalin Other (See Comments)    Pt. Experienced joint pain, depression, confusion    Codeine Nausea Only   Itraconazole Diarrhea and Nausea Only   Sulfamethoxazole Diarrhea and Nausea Only     Home Medications  Prior to Admission medications   Medication Sig Start Date End Date Taking? Authorizing Provider  acetaminophen (TYLENOL) 325 MG tablet Take 2 tablets (650 mg total) by mouth every 4 (four) hours as needed for mild pain (temp > 101.5). 03/30/19   Domenic Polite, MD  Cholecalciferol (VITAMIN D-1000 MAX ST) 1000 units tablet Take 1,000 Units by mouth daily.     [provider]  clonazePAM (KLONOPIN) 2 MG tablet Take 0.5-1 tablets (1-2 mg total) by mouth 2 (two) times daily. Take 1mg  in am and 2mg  in pm 03/30/19   Domenic Polite, MD  esomeprazole (NEXIUM) 40 MG capsule Take 40 mg by mouth daily.  09/16/17   [provider]  feeding supplement (BOOST HIGH PROTEIN) LIQD Take 1 Container by mouth 2 (two) times daily between meals.    [provider]  fluticasone (FLONASE) 50 MCG/ACT nasal spray Place 1 spray into both nostrils daily.  10/04/17   [provider]  fluticasone furoate-vilanterol (BREO ELLIPTA) 100-25 MCG/INH AEPB Inhale 1  puff into the lungs daily. 09/16/17   [provider]  Fluticasone-Umeclidin-Vilant (TRELEGY ELLIPTA) 100-62.5-25 MCG/INH AEPB Inhale 1 puff into the lungs daily. Patient not taking: Reported on 03/25/2019 11/26/17   Tanda Rockers, MD  furosemide (LASIX) 20 MG tablet Take 1 tablet (20 mg total) by mouth daily. 03/30/19   Domenic Polite, MD  gabapentin (NEURONTIN) 300 MG  capsule Take 300 mg by mouth 3 (three) times daily.  09/21/17   [provider]  ipratropium-albuterol (DUONEB) 0.5-2.5 (3) MG/3ML SOLN Take 3 mLs by nebulization every 6 (six) hours.    [provider]  Melatonin 5 MG TABS Take 5 mg by mouth at bedtime.    [provider]  ondansetron (ZOFRAN) 4 MG tablet Take 4-8 mg by mouth every 8 (eight) hours as needed for nausea or vomiting.    [provider]  OXYGEN Place 2-3 L into the nose See admin instructions. 2lpm with rest and 3lpm with exertion     [provider]  PROAIR HFA 108 (90 Base) MCG/ACT inhaler Inhale 2 puffs into the lungs every 6 (six) hours as needed for wheezing or shortness of breath.  09/16/17   [provider]  venlafaxine XR (EFFEXOR-XR) 75 MG 24 hr capsule Take 75 mg by mouth daily with breakfast.  09/16/17   [provider]     This patient is critically ill with multiple organ system failure; which, requires frequent high complexity decision making, assessment, support, evaluation, and titration of therapies. This was completed through the application of advanced monitoring technologies and extensive interpretation of multiple databases. During this encounter critical care time was devoted to patient care services described in this note for 34 minutes.  Garner Nash, DO [facility] 05/22/2019 7:37 AM  Personal pager: [phone] If unanswered, please page CCM On-call: [phone]

## 2019-05-22 NOTE — Progress Notes (Signed)
Palliative Medicine RN Note: Rec'd request from PMT NP Wadie Lessen to provide family with printed education about what a trach is and what vent dependence is. Printed information was left on a chair in the room, as no family is present.  Marjie Skiff Mitul Hallowell, RN, BSN, [facility] Palliative Medicine Team 05/22/2019 12:53 PM Office [phone]

## 2019-05-22 NOTE — Progress Notes (Signed)
 Initial Nutrition Assessment  DOCUMENTATION CODES:   Underweight, Non-severe (moderate) malnutrition in context of chronic illness  INTERVENTION:   Tube Feeding:  Vital AF 1.2 at 45 ml/hr Provides 1296 kcals, 81 g of protein, 875 mL of free water Meets 100% estimated calorie and protein needs   NUTRITION DIAGNOSIS:   Moderate Malnutrition related to chronic illness(chronic lung disease (COPD, restrictive lung disease), CHF) as evidenced by moderate fat depletion, severe muscle depletion.  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 Patient is currently intubated on ventilator support MV: 7.7 L/min Temp (24hrs), Avg:99.3 F (37.4 C), Min:98.6 F (37 C), Max:99.9 F (37.7 C)  Propofol: N/A   Pt reports appetite has been poor and she belives she has lost weight. Pt reports height of 5 feet 4 inches and UBW around 89 pounds No recent wt loss per weight encounters  Pt reports using Pro-Stat, Boost and Ensure supplements; she reports she likes the Pro-Stat the most as it does not upset her stomach. She does drink Boost/Ensure but again it does upset her stomach at times  Vital High Protein infusing at 40 ml/hr  Pt with signs of refeeding post initiation of TF. Hypokalemic, hypophosphatemic   Labs: potassium 3.2 (L), phosphorus 1.4 (L) Meds: ss novolog, solumedrol, potassium phosphate   NUTRITION - FOCUSED PHYSICAL EXAM:    Most Recent Value  Orbital Region  Moderate depletion   Upper Arm Region  Moderate depletion  Thoracic and Lumbar Region  Moderate depletion  Buccal Region  Unable to assess  Temple Region  Severe depletion  Clavicle Bone Region  Severe depletion  Clavicle and Acromion Bone Region  Severe depletion  Scapular Bone Region  Severe depletion  Dorsal Hand  Moderate depletion  Patellar Region  Severe depletion  Anterior Thigh Region  Severe depletion  Posterior Calf Region  Severe depletion  Edema (RD Assessment)  None       Diet Order:   Diet Order            Diet NPO time specified  Diet effective now              EDUCATION NEEDS:   Not appropriate for education at this time  Skin:  Skin Assessment: Reviewed RN Assessment  Last BM:  9/27  Height:   Ht Readings from Last 1 Encounters:  05/22/19 5\' 4"  (1.626 m)    Weight:   Wt Readings from Last 1 Encounters:  05/22/19 45.6 kg    Ideal Body Weight:  54.5 kg  BMI:  Body mass index is 17.26 kg/m.  Estimated Nutritional Needs:   Kcal:  1307 kcals  Protein:  68-91 g  Fluid:  >/= 1.3 L    Deaunte Dente MS, RDN, LDN, CNSC [phone] Pager  [phone] Weekend/On-Call Pager

## 2019-05-23 DIAGNOSIS — Z66 Do not resuscitate: Secondary | ICD-10-CM

## 2019-05-23 DIAGNOSIS — E440 Moderate protein-calorie malnutrition: Secondary | ICD-10-CM | POA: Insufficient documentation

## 2019-05-23 DIAGNOSIS — R531 Weakness: Secondary | ICD-10-CM

## 2019-05-23 DIAGNOSIS — Z7189 Other specified counseling: Secondary | ICD-10-CM

## 2019-05-23 DIAGNOSIS — Z515 Encounter for palliative care: Secondary | ICD-10-CM

## 2019-05-23 LAB — GLUCOSE, CAPILLARY
Glucose-Capillary: 111 mg/dL — ABNORMAL HIGH (ref 70–99)
Glucose-Capillary: 119 mg/dL — ABNORMAL HIGH (ref 70–99)
Glucose-Capillary: 123 mg/dL — ABNORMAL HIGH (ref 70–99)
Glucose-Capillary: 133 mg/dL — ABNORMAL HIGH (ref 70–99)
Glucose-Capillary: 133 mg/dL — ABNORMAL HIGH (ref 70–99)
Glucose-Capillary: 176 mg/dL — ABNORMAL HIGH (ref 70–99)
Glucose-Capillary: 184 mg/dL — ABNORMAL HIGH (ref 70–99)
Glucose-Capillary: 95 mg/dL (ref 70–99)

## 2019-05-23 LAB — BASIC METABOLIC PANEL
Anion gap: 8 (ref 5–15)
BUN: 25 mg/dL — ABNORMAL HIGH (ref 6–20)
CO2: 34 mmol/L — ABNORMAL HIGH (ref 22–32)
Calcium: 8.3 mg/dL — ABNORMAL LOW (ref 8.9–10.3)
Chloride: 95 mmol/L — ABNORMAL LOW (ref 98–111)
Creatinine, Ser: 0.55 mg/dL (ref 0.44–1.00)
GFR calc Af Amer: >60 mL/min (ref >60)
GFR calc non Af Amer: >60 mL/min (ref >60)
Glucose, Bld: 170 mg/dL — ABNORMAL HIGH (ref 70–99)
Potassium: 4.1 mmol/L (ref 3.5–5.1)
Sodium: 137 mmol/L (ref 135–145)

## 2019-05-23 MED ORDER — OLANZAPINE 5 MG PO TBDP
5.0000 mg | ORAL_TABLET | Freq: Every day | ORAL | Status: DC
  Administered 2019-05-23: 22:00:00 5 mg via ORAL
  Filled 2019-05-23: qty 1

## 2019-05-23 NOTE — Progress Notes (Signed)
Patient ID: Krystal Cantu, female   DOB: November 10, 1960, 58 y.o.   MRN: [id number]   This NP visited patient at the bedside as a follow up to  yesterday's [place], for palliative medicine needs and emotional support and to meet with family as scheduled to discuss plan of care.  This nurse practitioner met at the bedside with the patient's daughter/Rebecca, her mother and the patient's brother/Tim.  Continued conversation today regarding diagnosis, prognosis, goals of care, end-of-life wishes, disposition and options.  The difference between a aggressive medical intervention path  and a palliative comfort care path for this patient at this time was had.  Values and goals of care important to patient and family were attempted to be elicited.   Family brought in for scanning a copy of the patient's advanced directive, advance directives supports patient's decision today to one-way extubation.  DNR/DNI is now documented.   Patient remains hopeful for successful extubation and more quality time with her family.  Plan of Care: -DNR/DNI -One-way extubation with documentation of no reintubation now or in the future. -Patient is open to utilization of BiPAP for  respiratory support -Continue current medical treatment plan and hope for improvement -Evaluated over the next 24 to 48 hours, for disposition plan --ultimately patient would like to return home -If patient decompensates patient and family agree to a shift to comfort path   Questions and concerns addressed.   Family encouraged to call with questions or concerns.    PMT will continue to support holistically  Discussed with patient the importance of continued conversation with her  family and the medical providers regarding overall plan of care and treatment options,  ensuring decisions are within the context of the patients values and GOCs.     Discussed with Dr Valeta Harms and Estanislado Spire NP and bedside RN  Total time spent on the unit was 60  minutes  Greater than 50% of the time was spent in counseling and coordination of care  Wadie Lessen NP  Palliative Medicine Team Team Phone # [phone] Pager [phone]

## 2019-05-23 NOTE — Progress Notes (Signed)
NAME:  Krystal Cantu, MRN:  [id number], DOB:  1961/07/14, LOS: 3 ADMISSION DATE:  05/20/2019, CONSULTATION DATE:  9/26 [facility]ERR[facility]:  [facility] , CHIEF COMPLAINT:  Acute on chronic resp failure    Brief History   58 year old female admitted with acute on chronic hypoxic and hypercarbic respiratory failure requiring mechanical ventilation on 9/26.  History of present illness   This is a 57 year old female patient with a complicated pulmonary history as mentioned below followed by Dr. Melvyn Novas  in our clinic.  Last hospitalized in August 2020 for acute on chronic respiratory failure felt  Presents to the emergency room via EMS after being found at home unresponsive on 9/26.  Apparently had been in normal state of health the night prior.  On EMS arrival Glasgow Coma Scale was 3 with pulse oximetry at 44% this improved with saturations up to 97% on supplemental oxygen but she remained unresponsive, did not improve with BiPAP so was placed on mechanical ventilator and subsequently transferred to [facility] for further evaluation.  > On further questioning she does endorse approximately 2 to 3 days of progressive shortness of breath, productive cough with white sputum, worsening wheezing and decreased effectiveness of short-term bronchodilators. 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  Known significant component of restrictive lung disease superimposed on underlying COPD Due to COPD exacerbation this did require mechanical ventilation.   Significant Hospital Events     Consults:  Palliative care  Procedures:  Endotracheal tube 9/26  Significant Diagnostic Tests:   03/24/2019: ECHO IMPRESSIONS  1. The left ventricle has mildly reduced systolic function, with an ejection fraction of 45-50%. The cavity size was  normal. Left ventricular diastolic Doppler parameters are consistent with pseudonormalization. Left ventrical global hypokinesis without  regional wall motion abnormalities.  2. The right ventricle has mildly reduced systolic function. The cavity was normal. There is no increase in right ventricular wall thickness. Right ventricular systolic pressure is mildly elevated with an estimated pressure of 43.5 mmHg.  3. Left atrial size was not assessed.  4. Right atrial size was mildly dilated.  5. Mitral valve regurgitation is moderate by color flow Doppler.  6. The aortic valve has an indeterminate number of cusps. Mild thickening of the aortic valve. Mild calcification of the aortic valve. Aortic valve regurgitation is moderate by color flow Doppler.  7. The inferior vena cava was dilated in size with <50% respiratory variability.   Micro Data:  Respiratory culture 9/26>>>  Antimicrobials:  azith 9/26>>>  Interim history/subjective:   Patient remains intubated on mechanical life support critically ill.  Alert oriented able to communicate with via writing.  Objective   Blood pressure 118/78, pulse 87, temperature 98.8 F (37.1 C), temperature source Oral, resp. rate (!) 21, height 5\' 4"  (1.626 m), weight 46.8 kg, SpO2 97 %.    Vent Mode: PCV FiO2 (%):  [30 %] 30 % Set Rate:  [24 bmp] 24 bmp PEEP:  [8 cmH20] 8 cmH20 Plateau Pressure:  [34 cmH20-36 cmH20] 34 cmH20   Intake/Output Summary (Last 24 hours) at 05/23/2019 0802 Last data filed at 05/23/2019 0600 Gross per 24 hour  Intake 1594.25 ml  Output 1445 ml  Net 149.25 ml   Filed Weights   05/21/19 0500 05/22/19 0425 05/23/19 0500  Weight: 48.1 kg 45.6 kg 46.8 kg  Examination: General: Elderly female, cachectic low muscle mass critically ill on mechanical life support HENT: AT, no JVD, thin neck, tracking appropriately endotracheal tube in place Lungs: Crackles, poor volumes on high pressure support, pressure control  settings. Cardiovascular: Regular rate and rhythm, S1-S2 Abdomen: Soft, nontender, nondistended, bowel sounds present Extremities: No edema Neuro: Alert, oriented, following commands, no focal deficit   Resolved Hospital Problem list    Assessment & Plan:   Acute on chronic hypoxemic hypercarbic respiratory failure Acute exacerbation of her underlying chronic respiratory disease. Appears to have h/o a smoking-related interstitial lung disease, COPD, and prior diagnosis of BOOP dating back from 2006. Severe restrictive lung disease at baseline. Disease state would be consist with end-stage fibrotic lung disease and DPLD  Likely secondary pulmonary hypertension to underlying lung disease, was on sildenafil in the past, which in her case could make v/q mismatch worse  I doubt she is a candidate for transplantation evaluation as she was smoking up until this admission  Plan Discussed again plans today with patient at bedside. Continue full mechanical ventilatory support until we have a goals of care are clearly defined. Patient needs very high pressure support/pressure control to maintain low lung volumes consistent with poor pulmonary compliance. Before offering tracheostomy tube for prolonged mechanical ventilatory need would prefer to extubate and see if she is able to maintain on her own even though she does not pass traditional SBT.  Palliative care meeting scheduled today at noon. Continue IV Solu-Medrol Completed course of azithromycin Continue scheduled bronchodilators. We appreciate palliative care input and their assistance in this discussion. I am not sure that the patient or family completely understand the repercussions of prolonged mechanical ventilatory support need in the setting of advanced end-stage lung disease.  Hypotension, resolved Resolved continue to observe  Hyperglycemia  Plan SSI   Best practice:  Diet: NPO Pain/Anxiety/Delirium protocol (if indicated):  9/26 Code Status: full code  DVT prophylaxis 9/26 SUP: PPI DM:SSI Family Communication: Palliative care meeting this afternoon.  I called spoke with the brother this morning to confirm Disposition: ICU  Labs   CBC: Recent Labs  Lab 05/20/19 1502 05/20/19 1535 05/21/19 0547 05/22/19 0357  WBC 11.8*  --  9.4 5.8  NEUTROABS 11.1*  --  8.6*  --   HGB 8.3* 8.8* 7.3* 7.1*  HCT 28.2* 26.0* 25.1* 22.9*  MCV 103.3*  --  103.3* 98.3  PLT 352  --  314 [id number]    Basic Metabolic Panel: Recent Labs  Lab 05/20/19 1502 05/20/19 1535 05/21/19 0547 05/21/19 1408 05/22/19 0357 05/22/19 1632  NA 136 133* 134*  --  135  --   K 4.4 4.0 3.6  --  3.2*  --   CL 91*  --  92*  --  94*  --   CO2 35*  --  35*  --  34*  --   GLUCOSE 147*  --  120*  --  162*  --   BUN 15  --  13  --  30*  --   CREATININE 0.73  --  0.59  --  0.56  --   CALCIUM 6.9*  --  7.0*  --  7.1*  --   MG 2.3  --   --  2.1 2.8* 2.2  PHOS 2.4*  --   --  1.5* 1.4* 2.4*   GFR: Estimated Creatinine Clearance: 56.6 mL/min (by C-G formula based on SCr of 0.56 mg/dL). Recent Labs  Lab 05/20/19 1502 05/21/19 0547 05/22/19 [id number]  PROCALCITON 1.46  --   --   WBC 11.8* 9.4 5.8    Liver Function Tests: Recent Labs  Lab 05/20/19 1502  AST 22  ALT 19  ALKPHOS 73  BILITOT 0.7  PROT 6.1*  ALBUMIN 2.3*   No results for input(s): LIPASE, AMYLASE in the last 168 hours. No results for input(s): AMMONIA in the last 168 hours.  ABG    Component Value Date/Time   PHART 7.349 (L) 05/21/2019 0509   PCO2ART 67.7 (HH) 05/21/2019 0509   PO2ART 89.9 05/21/2019 0509   HCO3 36.2 (H) 05/21/2019 0509   TCO2 41 (H) 05/20/2019 1535   O2SAT 96.5 05/21/2019 0509     Coagulation Profile: No results for input(s): INR, PROTIME in the last 168 hours.  Cardiac Enzymes: No results for input(s): CKTOTAL, CKMB, CKMBINDEX, TROPONINI in the last 168 hours.  HbA1C: Hgb A1c MFr Bld  Date/Time Value Ref Range Status  03/23/2019 05:38 PM 5.6  4.8 - 5.6 % Final    Comment:    (NOTE) Pre diabetes:          5.7%-6.4% Diabetes:              >6.4% Glycemic control for   <7.0% adults with diabetes     CBG: Recent Labs  Lab 05/22/19 1129 05/22/19 1541 05/22/19 1957 05/23/19 0000 05/23/19 0348  GLUCAP 194* 168* 139* 184* 111*    Review of Systems:   Not able   Past Medical History  She,  has no past medical history on file.   Surgical History   No past surgical history on file.   Social History   reports that she has been smoking cigarettes. She has a 72.00 pack-year smoking history. She has quit using smokeless tobacco.   Family History   Her family history is not on file.   Allergies Allergies  Allergen Reactions   Ceftriaxone Anaphylaxis   Pregabalin Other (See Comments)    Pt. Experienced joint pain, depression, confusion    Codeine Nausea Only   Itraconazole Diarrhea and Nausea Only   Sulfamethoxazole Diarrhea and Nausea Only     Home Medications  Prior to Admission medications   Medication Sig Start Date End Date Taking? Authorizing Provider  acetaminophen (TYLENOL) 325 MG tablet Take 2 tablets (650 mg total) by mouth every 4 (four) hours as needed for mild pain (temp > 101.5). 03/30/19   Domenic Polite, MD  Cholecalciferol (VITAMIN D-1000 MAX ST) 1000 units tablet Take 1,000 Units by mouth daily.     [provider]  clonazePAM (KLONOPIN) 2 MG tablet Take 0.5-1 tablets (1-2 mg total) by mouth 2 (two) times daily. Take 1mg  in am and 2mg  in pm 03/30/19   Domenic Polite, MD  esomeprazole (NEXIUM) 40 MG capsule Take 40 mg by mouth daily.  09/16/17   [provider]  feeding supplement (BOOST HIGH PROTEIN) LIQD Take 1 Container by mouth 2 (two) times daily between meals.    [provider]  fluticasone (FLONASE) 50 MCG/ACT nasal spray Place 1 spray into both nostrils daily.  10/04/17   [provider]  fluticasone furoate-vilanterol (BREO ELLIPTA) 100-25 MCG/INH AEPB  Inhale 1 puff into the lungs daily. 09/16/17   [provider]  Fluticasone-Umeclidin-Vilant (TRELEGY ELLIPTA) 100-62.5-25 MCG/INH AEPB Inhale 1 puff into the lungs daily. Patient not taking: Reported on 03/25/2019 11/26/17   Tanda Rockers, MD  furosemide (LASIX) 20 MG tablet Take 1 tablet (20 mg total) by mouth daily. 03/30/19  Domenic Polite, MD  gabapentin (NEURONTIN) 300 MG capsule Take 300 mg by mouth 3 (three) times daily.  09/21/17   [provider]  ipratropium-albuterol (DUONEB) 0.5-2.5 (3) MG/3ML SOLN Take 3 mLs by nebulization every 6 (six) hours.    [provider]  Melatonin 5 MG TABS Take 5 mg by mouth at bedtime.    [provider]  ondansetron (ZOFRAN) 4 MG tablet Take 4-8 mg by mouth every 8 (eight) hours as needed for nausea or vomiting.    [provider]  OXYGEN Place 2-3 L into the nose See admin instructions. 2lpm with rest and 3lpm with exertion     [provider]  PROAIR HFA 108 (90 Base) MCG/ACT inhaler Inhale 2 puffs into the lungs every 6 (six) hours as needed for wheezing or shortness of breath.  09/16/17   [provider]  venlafaxine XR (EFFEXOR-XR) 75 MG 24 hr capsule Take 75 mg by mouth daily with breakfast.  09/16/17   [provider]   This patient is critically ill with multiple organ system failure; which, requires frequent high complexity decision making, assessment, support, evaluation, and titration of therapies. This was completed through the application of advanced monitoring technologies and extensive interpretation of multiple databases. During this encounter critical care time was devoted to patient care services described in this note for 34 minutes.  Garner Nash, DO [facility] 05/23/2019 8:02 AM  Personal pager: [phone] If unanswered, please page CCM On-call: [phone]

## 2019-05-23 NOTE — Progress Notes (Signed)
PCCM Brief Progress Note   Discussed Palliative Care outcomes with Wadie Lessen NP.  Palliative care was able to have a meaningful conversation with the patient's family.  The patient and family agree that 1-way extubation is appropriate, and patient would not want to be re-intubated. The patient and family do however wish for NPPV vent support as indicated, upon extubation. Family and patient understand that after extubation, patient may decompensate. If patient decompensates, anticipate likely transition to comfort care.  P -1-way extubation when family has arrived and is ready -NPPV support desired by patient/family if indicated following extubation -If decompensates or fails to improve, consider comfort care.  -I will make Dr. Valeta Harms PCCM aware    Eliseo Gum MSN, AGACNP-BC [place] [id number] If no answer, [id number] 05/23/2019, 2:47 PM '

## 2019-05-23 NOTE — Procedures (Signed)
Extubation Procedure Note  Patient Details:   Name: Krystal Cantu DOB: Dec 30, 1960 MRN: [id number]   Airway Documentation:    Vent end date: 05/23/19 Vent end time: 1540   Evaluation  O2 sats: stable throughout Complications: No apparent complications Patient did tolerate procedure well. Bilateral Breath Sounds: Rhonchi, Inspiratory wheezes   Yes   Patient was extubated to a 4L [place] initially but then required a non rebreather due to decreased SAT's. Patient is currently not having any trouble breathing or stridor. Patient denies shortness of breath. Family and RN were present during extubation.   Claretta Fraise 05/23/2019, 3:40 PM

## 2019-05-23 NOTE — Progress Notes (Addendum)
[name] DOB: 03-19-1961 MRN: [id number]   Date of Service  05/23/2019  HPI/Events of Note  Pt was extubated compassionately earlier today. She has a tenouos respiratory status and is not to be re-intubated. She has had some agitation and anxiety but is unable to swallow medications.   eICU Interventions  Zyprexa Zydis 5 mg sub-lingually hs prn agitation/ anxiety.        Kerry Kass Shaquasha Gerstel 05/23/2019, 8:30 PM

## 2019-05-23 NOTE — Progress Notes (Signed)
4 hours post extubation.  Attempted one ice with patient which resulted in coughing.  Will not proceed with further evaluation.

## 2019-05-24 DIAGNOSIS — Z66 Do not resuscitate: Secondary | ICD-10-CM

## 2019-05-24 LAB — GLUCOSE, CAPILLARY
Glucose-Capillary: 109 mg/dL — ABNORMAL HIGH (ref 70–99)
Glucose-Capillary: 115 mg/dL — ABNORMAL HIGH (ref 70–99)
Glucose-Capillary: 109 mg/dL — ABNORMAL HIGH (ref 70–99)
Glucose-Capillary: 124 mg/dL — ABNORMAL HIGH (ref 70–99)
Glucose-Capillary: 170 mg/dL — ABNORMAL HIGH (ref 70–99)

## 2019-05-24 MED ORDER — METHYLPREDNISOLONE SODIUM SUCC 40 MG IJ SOLR
40.0000 mg | Freq: Every day | INTRAMUSCULAR | Status: AC
  Administered 2019-05-25 – 2019-05-27 (×3): 40 mg via INTRAVENOUS
  Filled 2019-05-24 (×4): qty 1

## 2019-05-24 MED ORDER — ALBUTEROL SULFATE (2.5 MG/3ML) 0.083% IN NEBU
INHALATION_SOLUTION | RESPIRATORY_TRACT | Status: AC
  Filled 2019-05-24: qty 3

## 2019-05-24 MED ORDER — ENOXAPARIN SODIUM 300 MG/3ML IJ SOLN
20.0000 mg | INTRAMUSCULAR | Status: DC
  Administered 2019-05-24 – 2019-05-25 (×2): 20 mg via SUBCUTANEOUS
  Filled 2019-05-24 (×2): qty 0.2

## 2019-05-24 MED ORDER — FAMOTIDINE 20 MG PO TABS
20.0000 mg | ORAL_TABLET | Freq: Every day | ORAL | Status: DC
  Administered 2019-05-25 – 2019-06-07 (×14): 20 mg via ORAL
  Filled 2019-05-24 (×14): qty 1

## 2019-05-24 MED ORDER — PRO-STAT SUGAR FREE PO LIQD
30.0000 mL | Freq: Three times a day (TID) | ORAL | Status: DC
  Administered 2019-05-24 – 2019-06-08 (×38): 30 mL via ORAL
  Filled 2019-05-24 (×39): qty 30

## 2019-05-24 MED ORDER — ENSURE ENLIVE PO LIQD
237.0000 mL | Freq: Three times a day (TID) | ORAL | Status: DC
  Administered 2019-05-24 – 2019-06-08 (×40): 237 mL via ORAL
  Filled 2019-05-24: qty 237

## 2019-05-24 MED ORDER — ALBUTEROL SULFATE (2.5 MG/3ML) 0.083% IN NEBU
2.5000 mg | INHALATION_SOLUTION | Freq: Once | RESPIRATORY_TRACT | Status: AC | PRN
  Administered 2019-06-01: 2.5 mg via RESPIRATORY_TRACT
  Filled 2019-05-24: qty 3

## 2019-05-24 MED ORDER — DEXTROSE 50 % IV SOLN
INTRAVENOUS | Status: AC
  Administered 2019-05-24: 50 mL
  Filled 2019-05-24: qty 50

## 2019-05-24 MED ORDER — ALPRAZOLAM 0.5 MG PO TABS
0.5000 mg | ORAL_TABLET | Freq: Three times a day (TID) | ORAL | Status: DC | PRN
  Administered 2019-05-24: 0.5 mg via ORAL
  Filled 2019-05-24: qty 1

## 2019-05-24 NOTE — Progress Notes (Signed)
05/24/2019 Patient transfer from [place] to [place] at 1815. She was alert, oriented when arrive on the unit, but very weak. Patient had no respiratory distress. Patient had bruises on arms. No wounds under foam dressing and skin intact. [facility] RN.

## 2019-05-24 NOTE — Progress Notes (Signed)
NAME:  Krystal Cantu, MRN:  [id number], DOB:  1960-12-03, LOS: 4 ADMISSION DATE:  05/20/2019, CONSULTATION DATE:  9/26 [facility]ERRING [facility]:  [facility] , CHIEF COMPLAINT:  Acute on chronic resp failure    Brief History   58 year old female admitted with acute on chronic hypoxic and hypercarbic respiratory failure requiring mechanical ventilation on 9/26.  History of present illness   This is a 58 year old female patient with a complicated pulmonary history as mentioned below followed by Dr. Melvyn Novas  in our clinic.  Last hospitalized in August 2020 for acute on chronic respiratory failure felt  Presents to the emergency room via EMS after being found at home unresponsive on 9/26.  Apparently had been in normal state of health the night prior.  On EMS arrival Glasgow Coma Scale was 3 with pulse oximetry at 44% this improved with saturations up to 97% on supplemental oxygen but she remained unresponsive, did not improve with BiPAP so was placed on mechanical ventilator and subsequently transferred to [facility] for further evaluation.  > On further questioning she does endorse approximately 2 to 3 days of progressive shortness of breath, productive cough with white sputum, worsening wheezing and decreased effectiveness of short-term bronchodilators. 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  Known significant component of restrictive lung disease superimposed on underlying COPD Due to COPD exacerbation this did require mechanical ventilation.   Significant Hospital Events     Consults:  Palliative care  Procedures:  Endotracheal tube 9/26 One-way extubation 05/23/2019  Significant Diagnostic Tests:   03/24/2019: ECHO IMPRESSIONS  1. The left ventricle has mildly reduced systolic function, with an ejection fraction  of 45-50%. The cavity size was normal. Left ventricular diastolic Doppler parameters are consistent with pseudonormalization. Left ventrical global hypokinesis without  regional wall motion abnormalities.  2. The right ventricle has mildly reduced systolic function. The cavity was normal. There is no increase in right ventricular wall thickness. Right ventricular systolic pressure is mildly elevated with an estimated pressure of 43.5 mmHg.  3. Left atrial size was not assessed.  4. Right atrial size was mildly dilated.  5. Mitral valve regurgitation is moderate by color flow Doppler.  6. The aortic valve has an indeterminate number of cusps. Mild thickening of the aortic valve. Mild calcification of the aortic valve. Aortic valve regurgitation is moderate by color flow Doppler.  7. The inferior vena cava was dilated in size with <50% respiratory variability.   Micro Data:  Respiratory culture 9/26>>>  Antimicrobials:  azith 9/26>>> x3 days  Interim history/subjective:   Patient tolerating well following one-way extubation yesterday.  Patient very thankful and grateful for care she is received here so far.  She would like to get up in the chair if at all possible today.  We will also plan to consult PT and OT to work with her.  Plans to move from the intensive care unit.  Objective   Blood pressure 118/73, pulse 83, temperature 98.8 F (37.1 C), temperature source Oral, resp. rate (!) 24, height 5\' 4"  (1.626 m), weight 42.1 kg, SpO2 100 %.    Vent Mode: PCV FiO2 (%):  [30 %-100 %] 100 % Set Rate:  [24 bmp] 24 bmp PEEP:  [8 cmH20] 8 cmH20 Plateau Pressure:  [33 cmH20] 33 cmH20   Intake/Output Summary (Last 24 hours) at 05/24/2019  [place] filed at 05/24/2019 0600 Gross per 24 hour  Intake 562.36 ml  Output 2100 ml  Net -1537.64 ml   Filed Weights   05/22/19 0425 05/23/19 0500 05/24/19 0144  Weight: 45.6 kg 46.8 kg 42.1 kg    Examination: General: Elderly, cachectic  female low muscle mass chronically ill ill HENT: NCAT, sclera clear tracking appropriately Lungs: Crackles inspiratory, no wheeze Cardiovascular: Regular rate and rhythm, S1-S2 Abdomen: Soft, nontender, nondistended, bowel sounds present Extremities: No edema Neuro: Alert oriented follow commands no focal deficit   Resolved Hospital Problem list    Assessment & Plan:   Acute on chronic hypoxemic hypercarbic respiratory failure Acute exacerbation of her underlying chronic respiratory disease. Appears to have h/o a smoking-related interstitial lung disease, COPD, and prior diagnosis of BOOP dating back from 2006. Severe restrictive lung disease at baseline. Disease state would be consist with end-stage fibrotic lung disease and DPLD  Likely secondary pulmonary hypertension to underlying lung disease, was on sildenafil in the past, which in her case could make v/q mismatch worse  I doubt she is a candidate for transplantation evaluation as she was smoking up until this admission  Plan Patient doing very well today post extubation. We appreciate palliative care input and meeting that was conducted yesterday. We agree with current DNR status. She will need a clear plan going home. Would even consider home hospice at discharge. Complete additional few days of IV steroids at this order has been placed and tapered. Completed course of azithromycin Continue scheduled bronchodilators. I spoke with Dr. Marylyn Ishihara from the [facility] who has graciously agreed to pick up patient for tomorrow.  Patient stable for transfer out of the intensive care unit to the MedSurg floor.  Hypotension, resolved Resolved continue to observe  Hyperglycemia  Plan SSI   Best practice:  Diet: NPO Pain/Anxiety/Delirium protocol (if indicated): na Code Status: full code  DVT prophylaxis 9/26 SUP: PPI DM:SSI Family Communication: Patient updated Disposition: Medical floor  Labs   CBC: Recent  Labs  Lab 05/20/19 1502 05/20/19 1535 05/21/19 0547 05/22/19 0357  WBC 11.8*  --  9.4 5.8  NEUTROABS 11.1*  --  8.6*  --   HGB 8.3* 8.8* 7.3* 7.1*  HCT 28.2* 26.0* 25.1* 22.9*  MCV 103.3*  --  103.3* 98.3  PLT 352  --  314 [id number]    Basic Metabolic Panel: Recent Labs  Lab 05/20/19 1502 05/20/19 1535 05/21/19 0547 05/21/19 1408 05/22/19 0357 05/22/19 1632 05/23/19 0827  NA 136 133* 134*  --  135  --  137  K 4.4 4.0 3.6  --  3.2*  --  4.1  CL 91*  --  92*  --  94*  --  95*  CO2 35*  --  35*  --  34*  --  34*  GLUCOSE 147*  --  120*  --  162*  --  170*  BUN 15  --  13  --  30*  --  25*  CREATININE 0.73  --  0.59  --  0.56  --  0.55  CALCIUM 6.9*  --  7.0*  --  7.1*  --  8.3*  MG 2.3  --   --  2.1 2.8* 2.2  --   PHOS 2.4*  --   --  1.5* 1.4* 2.4*  --    GFR: Estimated Creatinine Clearance: 50.9 mL/min (by C-G formula based on SCr of 0.55 mg/dL). Recent Labs  Lab 05/20/19 1502 05/21/19 0547  05/22/19 0357  PROCALCITON 1.46  --   --   WBC 11.8* 9.4 5.8    Liver Function Tests: Recent Labs  Lab 05/20/19 1502  AST 22  ALT 19  ALKPHOS 73  BILITOT 0.7  PROT 6.1*  ALBUMIN 2.3*   No results for input(s): LIPASE, AMYLASE in the last 168 hours. No results for input(s): AMMONIA in the last 168 hours.  ABG    Component Value Date/Time   PHART 7.349 (L) 05/21/2019 0509   PCO2ART 67.7 (HH) 05/21/2019 0509   PO2ART 89.9 05/21/2019 0509   HCO3 36.2 (H) 05/21/2019 0509   TCO2 41 (H) 05/20/2019 1535   O2SAT 96.5 05/21/2019 0509     Coagulation Profile: No results for input(s): INR, PROTIME in the last 168 hours.  Cardiac Enzymes: No results for input(s): CKTOTAL, CKMB, CKMBINDEX, TROPONINI in the last 168 hours.  HbA1C: Hgb A1c MFr Bld  Date/Time Value Ref Range Status  03/23/2019 05:38 PM 5.6 4.8 - 5.6 % Final    Comment:    (NOTE) Pre diabetes:          5.7%-6.4% Diabetes:              >6.4% Glycemic control for   <7.0% adults with diabetes     CBG:  Recent Labs  Lab 05/23/19 1602 05/23/19 2000 05/23/19 2341 05/24/19 0346 05/24/19 0802  GLUCAP 133* 95 133* 109* 115*    Review of Systems:   Not able   Past Medical History  She,  has no past medical history on file.   Surgical History   No past surgical history on file.   Social History   reports that she has been smoking cigarettes. She has a 72.00 pack-year smoking history. She has quit using smokeless tobacco.   Family History   Her family history is not on file.   Allergies Allergies  Allergen Reactions  . Ceftriaxone Anaphylaxis  . Pregabalin Other (See Comments)    Pt. Experienced joint pain, depression, confusion   . Codeine Nausea Only  . Itraconazole Diarrhea and Nausea Only  . Sulfamethoxazole Diarrhea and Nausea Only     Home Medications  Prior to Admission medications   Medication Sig Start Date End Date Taking? Authorizing Provider  acetaminophen (TYLENOL) 325 MG tablet Take 2 tablets (650 mg total) by mouth every 4 (four) hours as needed for mild pain (temp > 101.5). 03/30/19   Domenic Polite, MD  Cholecalciferol (VITAMIN D-1000 MAX ST) 1000 units tablet Take 1,000 Units by mouth daily.     [provider]  clonazePAM (KLONOPIN) 2 MG tablet Take 0.5-1 tablets (1-2 mg total) by mouth 2 (two) times daily. Take 1mg  in am and 2mg  in pm 03/30/19   Domenic Polite, MD  esomeprazole (NEXIUM) 40 MG capsule Take 40 mg by mouth daily.  09/16/17   [provider]  feeding supplement (BOOST HIGH PROTEIN) LIQD Take 1 Container by mouth 2 (two) times daily between meals.    [provider]  fluticasone (FLONASE) 50 MCG/ACT nasal spray Place 1 spray into both nostrils daily.  10/04/17   [provider]  fluticasone furoate-vilanterol (BREO ELLIPTA) 100-25 MCG/INH AEPB Inhale 1 puff into the lungs daily. 09/16/17   [provider]  Fluticasone-Umeclidin-Vilant (TRELEGY ELLIPTA) 100-62.5-25 MCG/INH AEPB Inhale 1 puff into the lungs  daily. Patient not taking: Reported on 03/25/2019 11/26/17   Tanda Rockers, MD  furosemide (LASIX) 20 MG tablet Take 1 tablet (20 mg total) by mouth  daily. 03/30/19   Domenic Polite, MD  gabapentin (NEURONTIN) 300 MG capsule Take 300 mg by mouth 3 (three) times daily.  09/21/17   [provider]  ipratropium-albuterol (DUONEB) 0.5-2.5 (3) MG/3ML SOLN Take 3 mLs by nebulization every 6 (six) hours.    [provider]  Melatonin 5 MG TABS Take 5 mg by mouth at bedtime.    [provider]  ondansetron (ZOFRAN) 4 MG tablet Take 4-8 mg by mouth every 8 (eight) hours as needed for nausea or vomiting.    [provider]  OXYGEN Place 2-3 L into the nose See admin instructions. 2lpm with rest and 3lpm with exertion     [provider]  PROAIR HFA 108 (90 Base) MCG/ACT inhaler Inhale 2 puffs into the lungs every 6 (six) hours as needed for wheezing or shortness of breath.  09/16/17   [provider]  venlafaxine XR (EFFEXOR-XR) 75 MG 24 hr capsule Take 75 mg by mouth daily with breakfast.  09/16/17   [provider]    Garner Nash, [facility] 05/24/2019 8:52 AM  Personal pager: [phone] If unanswered, please page CCM On-call: [phone]

## 2019-05-24 NOTE — Progress Notes (Signed)
Patient ID: Krystal Cantu, female   DOB: 11/20/1960, 58 y.o.   MRN: [id number]   This NP visited patient at the bedside as a follow up for  palliative medicine needs and emotional support.  Patient is OOB to chair, alert and oriented and "feels goodt".  Patient is thrilled that she is doing so well post extubation :)  We discussed the next steps; transitions of care,  disposition and options.  Krystal Cantu tells me she know she cannot return home to live alone right now.  She is hopeful for short term rehab but is considering long term placement.  Continue to take it "one step at a time", ongoing assessment for viable disposition plan; if patient remains stable  SNF for rehab vs long term placement, if she decompensates shift to comfort.  We discussed hospice benefit, she would be eligible for home hospice services   Questions and concerns addressed.   PMT will continue to support holistically  Discussed with patient the importance of continued conversation with her  family and the medical providers regarding overall plan of care and treatment options,  ensuring decisions are within the context of the patients values and GOCs.     Discussed with Dr Valeta Harms   Total time spent on the unit was 35 minutes  Greater than 50% of the time was spent in counseling and coordination of care  Krystal Lessen NP  Palliative Medicine Team Team Phone # [phone] Pager [phone]

## 2019-05-24 NOTE — Progress Notes (Signed)
Chaplain stopped by to check on Krystal Cantu and her daughter. [name] was resting at the time. Daughter said they did not need anything at that time. Chaplain made daughter aware that chaplain services are available 24/7.  Chaplain Resident, Evelene Croon, M Div Pager # [phone] personal Pager # [phone] on-call

## 2019-05-24 NOTE — Significant Event (Signed)
Rapid Response Event Note  Overview:  Asked to see a pt that was just transferred from 83M with increased WOB.  It was stated t6hat pt is a DNR and spO2 has been mid to high 90's although pt c/o SOB.  Initial Focused Assessment: On arrival, pt sitting in bed in respiratory distress. Pt unable to speak d/t SOB despite talking normally with RN on transfer. HR 99, BP 129/83, spO2 96% on 6L HFNC, RR 38. Lungs with inspiratory and expiratory wheezing throughout. Pt daughter and HPOA at bedside.   Interventions: neb treatment Bipap E-link called and Dr Lucile Shutters stated he agreed with Bipap and would also like to order a low dose of morphine to help with breathing.   Plan of Care (if not transferred): Continue to monitor respiratory status. If pt does not improve may need to call Md back and transition to comfort care since pt DNR/DNI. (palliative on board and has had this discussion according to notes). Will continue to monitor pt throughout the night. RN instructed to call back with any questions or concerns.   Event Summary:  arrived at 1900     event ended at  [address]

## 2019-05-24 NOTE — Consult Note (Signed)
Responded to call from floor to see pt. Offered pt (who is [place]) prayer & spiritual & emotional support before she is soon to transfer to rehab facility. Invited pt to call again for a chaplain if she wishes  before she goes..  Rev. Eloise Levels Chaplain

## 2019-05-24 NOTE — Progress Notes (Signed)
 Nutrition Follow-up  DOCUMENTATION CODES:   Underweight, Non-severe (moderate) malnutrition in context of chronic illness  INTERVENTION:   Ensure Enlive poTID, each supplement provides 350 kcal and 20 grams of protein  30 ml Prostat TID, each supplement provides 100 kcals and 15 grams protein.   Advance diet as tolerated to Regular  NUTRITION DIAGNOSIS:   Moderate Malnutrition related to chronic illness(chronic lung disease (COPD, restrictive lung disease), CHF) as evidenced by moderate fat depletion, severe muscle depletion.  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 Currently on HFNC.   Diet advanced to CL this AM, advance as tolerated  Pt previously reporting she takes Pro-Stat and Boost/Ensure supplements; RD to order  Weight down to 42.1 kg, admit weight 48.1 kg  Labs: reviewed Meds: solumedrol, ss novolog    Diet Order:   Diet Order            Diet clear liquid Room service appropriate? Yes; Fluid consistency: Thin  Diet effective now              EDUCATION NEEDS:   Not appropriate for education at this time  Skin:  Skin Assessment: Reviewed RN Assessment  Last BM:  9/27  Height:   Ht Readings from Last 1 Encounters:  05/22/19 5\' 4"  (1.626 m)    Weight:   Wt Readings from Last 1 Encounters:  05/24/19 42.1 kg    Ideal Body Weight:  54.5  kg  BMI:  Body mass index is 15.93 kg/m.  Estimated Nutritional Needs:   Kcal:  1470-1680 kcals  Protein:  68-91 g  Fluid:  >/= 1.3 L    Ady Heimann MS, RDN, LDN, CNSC [phone] Pager  [phone] Weekend/On-Call Pager

## 2019-05-24 NOTE — Progress Notes (Signed)
   Vital Signs MEWS/VS Documentation      05/24/2019 1936 05/24/2019 1938 05/24/2019 1940 05/24/2019 2008   MEWS Score:  5  4  4  3    MEWS Score Color:  Red  Red  Red  Yellow   Resp:  (!) 36  (!) 33  (!) 36  -   Pulse:  (!) 113  (!) 113  (!) 109  95   BP:  -  -  -  129/83   Temp:  -  -  -  97.8 F (36.6 C)   O2 Device:  (S) Bi-PAP  -  Bi-PAP  Bi-PAP   O2 Flow Rate (L/min):  -  -  -  -   FiO2 (%):  96 %  -  40 %  -      RN was called to patient room at 1846, patient was having problem breathing. Rn when to assess patient she was having work of breathing, but saturation was in the 90's. Patient was on High Flow at 6 Liters. Lung was assess by Asencion Partridge (charge nurse) patient had wheezes. Respiratory was called for patient to receive breathing treatment. Dr Tamala Julian was made aware of patient's condition. Patient received Xanax 0.5 mg. Rapid response was called to the bedside,Albuteral was ordered by rapid and patient was given Albuterol neb treatment.     Hecker, Shanetta Nicolls R 05/24/2019,8:38 PM

## 2019-05-24 NOTE — Progress Notes (Signed)
[facility] Progress Note Patient Name: Krystal Cantu DOB: [birth year] MRN: [id number]   Date of Service  05/24/2019  HPI/Events of Note  Pt with one way extubation yesterday. Limited trial of BIPAP for respiratory discomfort is acceptable to patient , and she is now in some distress. She is a DNR. RRT nurse requesting permission for BIPAP.  eICU Interventions  BIPAP request granted.        Kerry Kass Elissa Grieshop 05/24/2019, 8:13 PM

## 2019-05-25 ENCOUNTER — Other Ambulatory Visit: Payer: Self-pay

## 2019-05-25 DIAGNOSIS — F419 Anxiety disorder, unspecified: Secondary | ICD-10-CM

## 2019-05-25 DIAGNOSIS — R0609 Other forms of dyspnea: Secondary | ICD-10-CM

## 2019-05-25 LAB — GLUCOSE, CAPILLARY
Glucose-Capillary: 107 mg/dL — ABNORMAL HIGH (ref 70–99)
Glucose-Capillary: 124 mg/dL — ABNORMAL HIGH (ref 70–99)
Glucose-Capillary: 135 mg/dL — ABNORMAL HIGH (ref 70–99)
Glucose-Capillary: 169 mg/dL — ABNORMAL HIGH (ref 70–99)
Glucose-Capillary: 54 mg/dL — ABNORMAL LOW (ref 70–99)
Glucose-Capillary: 87 mg/dL (ref 70–99)
Glucose-Capillary: 89 mg/dL (ref 70–99)
Glucose-Capillary: 90 mg/dL (ref 70–99)

## 2019-05-25 MED ORDER — MORPHINE SULFATE (PF) 2 MG/ML IV SOLN
1.0000 mg | INTRAVENOUS | Status: DC | PRN
  Administered 2019-05-27: 1 mg via INTRAVENOUS
  Filled 2019-05-25: qty 1

## 2019-05-25 MED ORDER — LORAZEPAM 2 MG/ML IJ SOLN
1.0000 mg | INTRAMUSCULAR | Status: DC | PRN
  Administered 2019-05-26 – 2019-05-28 (×5): 1 mg via INTRAVENOUS
  Filled 2019-05-25 (×5): qty 1

## 2019-05-25 NOTE — Progress Notes (Addendum)
PCCM Interval Progress Note  58 y.o. female admitted 05/20/19 with acute on chronic hypoxic and hypercarbic respiratory failure requiring intubation / mechanical ventilation. Liberated from the ventilator with one way extubation on 05/23/19.  Initially received call from home health regarding need for order / signature for home NIV.  This was done and faxed to home health; however, later discussed case with Wadie Lessen, NP with Palliative Medicine.  I was informed that pt is certainly not close to ready for home discharge.  Discussions currently ongoing with family regarding potential home hopsice vs SNF vs shifting to comfort, etc.   D/c home NIV.  Dispo per palliative / primary team.  Appears will be headed towards SNF / home hospice / comfort.   Montey Hora, [place] [facility] Pager: [phone].  If no answer, [phone] 05/25/2019, 8:55 AM

## 2019-05-25 NOTE — Progress Notes (Signed)
PROGRESS NOTE    Krystal Cantu  TOI:[id number] DOB: 10-18-1960 DOA: 05/20/2019 PCP: Bonnita Nasuti, MD   Brief Narrative:  This is a 58 year old female patient with a complicated pulmonary history as mentioned below followed by Dr. Melvyn Novas.  Last hospitalized in August 2020 for acute on chronic respiratory failure felt  Presented to the emergency room on 05/20/2019 via EMS after being found at home unresponsive on.  Apparently had been in normal state of health the night prior.  On EMS arrival Glasgow Coma Scale was 3 with pulse oximetry at 44% this improved with saturations up to 97% on supplemental oxygen but she remained unresponsive, did not improve with BiPAP so was placed on mechanical ventilator and subsequently transferred to [facility] for further evaluation.  > On further questioning she does endorse approximately 2 to 3 days of progressive shortness of breath, productive cough with white sputum, worsening wheezing and decreased effectiveness of short-term bronchodilators. > She has had secondhand smoke exposure.  She was extubated on 05/23/2019 and was subsequently transferred under [facility] care on 05/25/2019.  She was also seen by palliative care and was made DNR.  Assessment & Plan:   Active Problems:   COPD exacerbation ([place])   Endotracheally intubated   Malnutrition of moderate degree   Palliative care by specialist   DNR (do not resuscitate) discussion   DNR (do not resuscitate)   Weakness generalized   Acute on chronic hypoxemic hypercarbic respiratory failure Acute exacerbation of her underlying chronic respiratory disease. Appears to have h/o a smoking-related interstitial lung disease, COPD, and prior diagnosis of BOOP dating back from 2006. Severe restrictive lung disease at baseline. Disease state would be consist with end-stage fibrotic lung disease and DPLD  Likely secondary pulmonary hypertension to underlying lung disease, was on sildenafil in the past, which in her case  could make v/q mismatch worse  I doubt she is a candidate for transplantation evaluation as she was smoking up until this admission  Plan Patient doing very well today post extubation.  Currently on 4 L of nasal oxygen.  She was on BiPAP overnight. We appreciate palliative care input.  They continue to follow for possible hospice consideration.  She was made DNR. She will need a clear plan going home. Complete additional few days of IV steroids at this order has been placed and tapered. Completed course of azithromycin Continue scheduled bronchodilators.  Hypotension, resolved Resolved continue to observe  Hyperglycemia : Controlled.  Continue SSI.  Anxiety: Continue PRN Xanax.  Physical deconditioning: Consulted PT.  DVT prophylaxis: Lovenox Code Status: DNR Family Communication:  None present at bedside.  Plan of care discussed with patient in length and he verbalized understanding and agreed with it. Disposition Plan: TBD  Consultants:   Palliative care  Procedures:   Intubation on 05/20/2019 and extubation on 05/23/2019  Antimicrobials:   Completed course of azithromycin   Subjective: Patient seen and examined.  She is on 2 L of oxygen.  She is alert but oriented x2.  No complaints and she looks comfortable.  Objective: Vitals:   05/25/19 0730 05/25/19 0733 05/25/19 0738 05/25/19 0822  BP:      Pulse: (!) 105   (!) 103  Resp: (!) 35   (!) 27  Temp:   97.6 F (36.4 C)   TempSrc:   Axillary   SpO2: 97% 96%  100%  Weight:      Height:        Intake/Output Summary (Last 24 hours) at  05/25/2019 1108 Last data filed at 05/25/2019 1016 Gross per 24 hour  Intake -  Output 2600 ml  Net -2600 ml   Filed Weights   05/23/19 0500 05/24/19 0144 05/25/19 0423  Weight: 46.8 kg 42.1 kg 42 kg    Examination:  General exam: Appears calm and comfortable but older than her stated age. Respiratory system: Coarse breath sounds bilaterally with rhonchi, respiratory  effort normal. Cardiovascular system: S1 & S2 heard, RRR. No JVD, murmurs, rubs, gallops or clicks. No pedal edema. Gastrointestinal system: Abdomen is nondistended, soft and nontender. No organomegaly or masses felt. Normal bowel sounds heard. Central nervous system: Alert and oriented x2. No focal neurological deficits. Extremities: Symmetric 5 x 5 power. Skin: No rashes, lesions or ulcers Psychiatry: Judgement and insight appear poor. Mood & affect flat.   Data Reviewed: I have personally reviewed following labs and imaging studies  CBC: Recent Labs  Lab 05/20/19 1502 05/20/19 1535 05/21/19 0547 05/22/19 0357  WBC 11.8*  --  9.4 5.8  NEUTROABS 11.1*  --  8.6*  --   HGB 8.3* 8.8* 7.3* 7.1*  HCT 28.2* 26.0* 25.1* 22.9*  MCV 103.3*  --  103.3* 98.3  PLT 352  --  314 638   Basic Metabolic Panel: Recent Labs  Lab 05/20/19 1502 05/20/19 1535 05/21/19 0547 05/21/19 1408 05/22/19 0357 05/22/19 1632 05/23/19 0827  NA 136 133* 134*  --  135  --  137  K 4.4 4.0 3.6  --  3.2*  --  4.1  CL 91*  --  92*  --  94*  --  95*  CO2 35*  --  35*  --  34*  --  34*  GLUCOSE 147*  --  120*  --  162*  --  170*  BUN 15  --  13  --  30*  --  25*  CREATININE 0.73  --  0.59  --  0.56  --  0.55  CALCIUM 6.9*  --  7.0*  --  7.1*  --  8.3*  MG 2.3  --   --  2.1 2.8* 2.2  --   PHOS 2.4*  --   --  1.5* 1.4* 2.4*  --    GFR: Estimated Creatinine Clearance: 50.8 mL/min (by C-G formula based on SCr of 0.55 mg/dL). Liver Function Tests: Recent Labs  Lab 05/20/19 1502  AST 22  ALT 19  ALKPHOS 73  BILITOT 0.7  PROT 6.1*  ALBUMIN 2.3*   No results for input(s): LIPASE, AMYLASE in the last 168 hours. No results for input(s): AMMONIA in the last 168 hours. Coagulation Profile: No results for input(s): INR, PROTIME in the last 168 hours. Cardiac Enzymes: No results for input(s): CKTOTAL, CKMB, CKMBINDEX, TROPONINI in the last 168 hours. BNP (last 3 results) No results for input(s): PROBNP in  the last 8760 hours. HbA1C: No results for input(s): HGBA1C in the last 72 hours. CBG: Recent Labs  Lab 05/24/19 0346 05/24/19 0802 05/24/19 1147 05/24/19 1522 05/24/19 2203  GLUCAP 109* 115* 109* 124* 170*   Lipid Profile: No results for input(s): CHOL, HDL, LDLCALC, TRIG, CHOLHDL, LDLDIRECT in the last 72 hours. Thyroid Function Tests: No results for input(s): TSH, T4TOTAL, FREET4, T3FREE, THYROIDAB in the last 72 hours. Anemia Panel: No results for input(s): VITAMINB12, FOLATE, FERRITIN, TIBC, IRON, RETICCTPCT in the last 72 hours. Sepsis Labs: Recent Labs  Lab 05/20/19 1502  PROCALCITON 1.46    Recent Results (from the past 240 hour(s))  MRSA  PCR Screening     Status: None   Collection Time: 05/20/19  4:29 PM   Specimen: Nasal Mucosa; Nasopharyngeal  Result Value Ref Range Status   MRSA by PCR NEGATIVE NEGATIVE Final    Comment:        The GeneXpert MRSA Assay (FDA approved for NASAL specimens only), is one component of a comprehensive MRSA colonization surveillance program. It is not intended to diagnose MRSA infection nor to guide or monitor treatment for MRSA infections. Performed at [facility], [place] [address], [place], [place] [address]       Radiology Studies: No results found.  Scheduled Meds: . budesonide (PULMICORT) nebulizer solution  0.5 mg Nebulization BID  . chlorhexidine gluconate (MEDLINE KIT)  15 mL Mouth Rinse BID  . Chlorhexidine Gluconate Cloth  6 each Topical Daily  . enoxaparin (LOVENOX) injection  20 mg Subcutaneous Q24H  . famotidine  20 mg Oral QHS  . feeding supplement (ENSURE ENLIVE)  237 mL Oral TID BM  . feeding supplement (PRO-STAT SUGAR FREE 64)  30 mL Oral TID WC  . gabapentin  300 mg Oral Q8H  . insulin aspart  0-15 Units Subcutaneous Q4H  . ipratropium-albuterol  3 mL Nebulization Q4H  . mouth rinse  15 mL Mouth Rinse 10 times per day  . Melatonin  3 mg Per Tube QHS  . methylPREDNISolone (SOLU-MEDROL) injection   40 mg Intravenous Daily  . venlafaxine  75 mg Oral BID WC   Continuous Infusions: . sodium chloride 10 mL/hr at 05/24/19 0600     LOS: 5 days   Time spent: 35 minutes   Darliss Cheney, MD [facility] Pager [phone]  If 7PM-7AM, please contact night-coverage [email or web address] Password [email or web address] 05/25/2019, 11:08 AM

## 2019-05-25 NOTE — Evaluation (Signed)
Occupational Therapy Evaluation Patient Details Name: Krystal Cantu MRN: [id number] DOB: 1961-06-10 Today's Date: 05/25/2019    History of Present Illness Pt adm with acute on chronic respiratory failure requiring intubation  9/26. One way extubation 9/29. PMH - copd, boop vs ILD, anxiety, depression   Clinical Impression   Pt PTA: Pt lives alone and performing own ADL "to the best of my ability" per pt. Pt currently performing minimal ADL tasks in sitting. Pt combing hair and requiring rest breaks as pt's HR increasing to 132 BPM upon task. Overall pt minA to maxA for ADL and maxA +1-2 for mobility. Pt standing during this session. Pt unable to attempt to take any steps. Pt would benefit from continued OT skilled services for ADL, mobility and activity tolerance in SNF setting. OT following acutely.    Follow Up Recommendations  SNF;Supervision/Assistance - 24 hour    Equipment Recommendations  3 in 1 bedside commode    Recommendations for Other Services       Precautions / Restrictions Precautions Precautions: Fall;Other (comment) Precaution Comments: watch SpO2 Restrictions Weight Bearing Restrictions: No      Mobility Bed Mobility Overal bed mobility: Needs Assistance             General bed mobility comments: up in recliner upon arrival  Transfers Overall transfer level: Needs assistance   Transfers: Sit to/from Stand Sit to Stand: Max assist         General transfer comment: frontal transfer with OT and pt    Balance Overall balance assessment: Needs assistance Sitting-balance support: Bilateral upper extremity supported;Feet supported Sitting balance-Leahy Scale: Poor Sitting balance - Comments: UE support                                   ADL either performed or assessed with clinical judgement   ADL Overall ADL's : Needs assistance/impaired     Grooming: Moderate assistance;Sitting Grooming Details (indicate cue type and  reason): Pt combing hair and requiring rest breaks as pt's HR increasing to 132 BPM upon task Upper Body Bathing: Minimal assistance;Sitting   Lower Body Bathing: Maximal assistance;Sitting/lateral leans;Sit to/from stand;Cueing for safety   Upper Body Dressing : Minimal assistance;Sitting   Lower Body Dressing: Maximal assistance;Sitting/lateral leans;Sit to/from stand;Cueing for safety   Toilet Transfer: Maximal assistance;+2 for physical assistance;+2 for safety/equipment;Cueing for safety;Cueing for sequencing;Squat-pivot   Toileting- Clothing Manipulation and Hygiene: Maximal assistance;Cueing for safety;Cueing for sequencing;Sitting/lateral lean;Sit to/from stand       Functional mobility during ADLs: Maximal assistance General ADL Comments: Pt minA for UB ADL and maxA for LB ADL. Pt able to come to standing after x1 attempt for OT facing pt for sit to stand     Vision Baseline Vision/History: No visual deficits Patient Visual Report: No change from baseline Vision Assessment?: No apparent visual deficits     Perception     Praxis      Pertinent Vitals/Pain Pain Assessment: No/denies pain     Hand Dominance Right   Extremity/Trunk Assessment Upper Extremity Assessment Upper Extremity Assessment: Generalized weakness RUE Deficits / Details: 2/5 MM grade LUE Deficits / Details: 2/5 MM grade   Lower Extremity Assessment Lower Extremity Assessment: Defer to PT evaluation;Generalized weakness   Cervical / Trunk Assessment Cervical / Trunk Assessment: Kyphotic   Communication Communication Communication: No difficulties   Cognition Arousal/Alertness: Awake/alert Behavior During Therapy: Flat affect Overall Cognitive Status: Impaired/Different from baseline Area  of Impairment: Following commands;Problem solving                       Following Commands: Follows one step commands with increased time     Problem Solving: Slow processing;Decreased  initiation;Requires verbal cues;Requires tactile cues     General Comments  VSS. Pt on 2.5 L O2 upon arrival.    Exercises     Shoulder Instructions      Home Living Family/patient expects to be discharged to:: Skilled nursing facility Living Arrangements: Alone Available Help at Discharge: Family;Available PRN/intermittently Type of Home: Apartment Home Access: Stairs to enter Entrance Stairs-Number of Steps: 2 Entrance Stairs-Rails: None Home Layout: One level     Bathroom Shower/Tub: Teacher, early years/pre: Standard     Home Equipment: Environmental consultant - 2 wheels;Cane - single point;Bedside commode;Tub bench   Additional Comments: home O2      Prior Functioning/Environment Level of Independence: Needs assistance  Gait / Transfers Assistance Needed: Pt reports independent with amb in house ADL's / Homemaking Assistance Needed: Brother would do shopping for her,. Does not drive.   Comments: brother or daughter do shopping        OT Problem List: Decreased strength;Decreased activity tolerance;Impaired balance (sitting and/or standing);Decreased coordination;Decreased cognition;Decreased safety awareness;Pain      OT Treatment/Interventions: Self-care/ADL training;Neuromuscular education;Therapeutic exercise;Energy conservation;DME and/or AE instruction;Therapeutic activities;Patient/family education;Balance training    OT Goals(Current goals can be found in the care plan section) Acute Rehab OT Goals Patient Stated Goal: not stated OT Goal Formulation: With patient Time For Goal Achievement: 06/08/19 Potential to Achieve Goals: Good ADL Goals Pt Will Perform Grooming: with supervision;standing Pt Will Perform Upper Body Dressing: with supervision;standing Pt Will Perform Lower Body Dressing: with set-up;sit to/from stand Additional ADL Goal #1: Pt will perform OOB ADL with modified independence level assist with 1-2 seated rest breaks  OT Frequency: Min  2X/week   Barriers to D/C: Decreased caregiver support  home alone       Co-evaluation              AM-PAC OT "6 Clicks" Daily Activity     Outcome Measure Help from another person eating meals?: A Little Help from another person taking care of personal grooming?: A Lot Help from another person toileting, which includes using toliet, bedpan, or urinal?: Total Help from another person bathing (including washing, rinsing, drying)?: A Lot Help from another person to put on and taking off regular upper body clothing?: A Lot Help from another person to put on and taking off regular lower body clothing?: Total 6 Click Score: 11   End of Session Equipment Utilized During Treatment: Gait belt;Rolling walker;Oxygen Nurse Communication: Mobility status  Activity Tolerance: Treatment limited secondary to medical complications (Comment);Patient limited by lethargy Patient left: in chair;with call bell/phone within reach;with nursing/sitter in room  OT Visit Diagnosis: Unsteadiness on feet (R26.81);Muscle weakness (generalized) (M62.81);Pain                Time: [id number] OT Time Calculation (min): 20 min Charges:  OT General Charges $OT Visit: 1 Visit OT Evaluation $OT Eval Moderate Complexity: 1 Mod  Darryl Nestle) Marsa Aris OTR/L Acute Rehabilitation Services Pager: [phone] Office: [place] 05/25/2019, 4:04 PM

## 2019-05-25 NOTE — Evaluation (Signed)
Physical Therapy Evaluation Patient Details Name: Krystal Cantu MRN: [id number] DOB: 1961-02-12 Today's Date: 05/25/2019   History of Present Illness  Pt adm with acute on chronic respiratory failure requiring intubation  9/26. One way extubation 9/29. PMH - copd, boop vs ILD, anxiety, depression  Clinical Impression  Pt presents to PT with severe weakness and debility. Will need SNF at DC.     Follow Up Recommendations SNF    Equipment Recommendations  None recommended by PT    Recommendations for Other Services       Precautions / Restrictions Precautions Precautions: Fall;Other (comment) Precaution Comments: watch SpO2 Restrictions Weight Bearing Restrictions: No      Mobility  Bed Mobility Overal bed mobility: Needs Assistance Bed Mobility: Supine to Sit     Supine to sit: Max assist     General bed mobility comments: Assist to bring legs off of bed, elevate trunk into sitting, and bring hips to EOB  Transfers Overall transfer level: Needs assistance   Transfers: Squat Pivot Transfers     Squat pivot transfers: Max assist;+2 safety/equipment     General transfer comment: Attempted to stand with rollator. Pt unable to come to full stand with +2 max assist. Performed bed to chair with max assist with squat pivot  Ambulation/Gait                Stairs            Wheelchair Mobility    Modified Rankin (Stroke Patients Only)       Balance Overall balance assessment: Needs assistance Sitting-balance support: Bilateral upper extremity supported;Feet supported Sitting balance-Leahy Scale: Poor Sitting balance - Comments: UE support                                     Pertinent Vitals/Pain Pain Assessment: No/denies pain    Home Living Family/patient expects to be discharged to:: Skilled nursing facility Living Arrangements: Alone Available Help at Discharge: Family;Available PRN/intermittently Type of Home:  Apartment Home Access: Stairs to enter Entrance Stairs-Rails: None Entrance Stairs-Number of Steps: 2 Home Layout: One level Home Equipment: Walker - 2 wheels;Cane - single point;Bedside commode;Tub bench Additional Comments: home O2    Prior Function Level of Independence: Needs assistance   Gait / Transfers Assistance Needed: Pt reports independent with amb in house     Comments: brother or daughter do shopping     Hand Dominance   Dominant Hand: Right    Extremity/Trunk Assessment   Upper Extremity Assessment Upper Extremity Assessment: Defer to OT evaluation    Lower Extremity Assessment Lower Extremity Assessment: RLE deficits/detail;LLE deficits/detail RLE Deficits / Details: strength <3/5 LLE Deficits / Details: strength <3/5    Cervical / Trunk Assessment Cervical / Trunk Assessment: Kyphotic  Communication   Communication: No difficulties  Cognition Arousal/Alertness: Awake/alert Behavior During Therapy: Flat affect Overall Cognitive Status: Impaired/Different from baseline Area of Impairment: Following commands;Problem solving                       Following Commands: Follows one step commands with increased time     Problem Solving: Slow processing;Decreased initiation;Requires verbal cues;Requires tactile cues        General Comments General comments (skin integrity, edema, etc.): VSS. Pt on 4L of O2    Exercises     Assessment/Plan    PT Assessment Patient needs continued PT services  PT Problem List Decreased strength;Decreased activity tolerance;Decreased balance;Decreased mobility;Decreased cognition       PT Treatment Interventions DME instruction;Gait training;Functional mobility training;Therapeutic activities;Therapeutic exercise;Balance training;Patient/family education;Cognitive remediation    PT Goals (Current goals can be found in the Care Plan section)  Acute Rehab PT Goals Patient Stated Goal: not stated PT Goal  Formulation: With patient Time For Goal Achievement: 06/08/19 Potential to Achieve Goals: Fair    Frequency Min 2X/week   Barriers to discharge Decreased caregiver support lives alone    Co-evaluation               AM-PAC PT "6 Clicks" Mobility  Outcome Measure Help needed turning from your back to your side while in a flat bed without using bedrails?: A Lot Help needed moving from lying on your back to sitting on the side of a flat bed without using bedrails?: Total Help needed moving to and from a bed to a chair (including a wheelchair)?: Total Help needed standing up from a chair using your arms (e.g., wheelchair or bedside chair)?: Total Help needed to walk in hospital room?: Total Help needed climbing 3-5 steps with a railing? : Total 6 Click Score: 7    End of Session Equipment Utilized During Treatment: Gait belt;Oxygen Activity Tolerance: Patient limited by fatigue Patient left: in chair;with call bell/phone within reach;with chair alarm set Nurse Communication: Mobility status PT Visit Diagnosis: Unsteadiness on feet (R26.81);Other abnormalities of gait and mobility (R26.89);Muscle weakness (generalized) (M62.81)    Time: [id number] PT Time Calculation (min) (ACUTE ONLY): 17 min   Charges:   PT Evaluation $PT Eval Moderate Complexity: [place] Pager [phone] Office [place] 05/25/2019, 11:15 AM

## 2019-05-25 NOTE — Care Management Important Message (Signed)
Important Message  Patient Details  Name: Krystal Cantu MRN: [id number] Date of Birth: Jul 29, 1961   Medicare Important Message Given:  Yes     Lynden Flemmer 05/25/2019, 3:02 PM

## 2019-05-25 NOTE — Significant Event (Signed)
Rapid Response Event Note  Overview: Follow Up - Respiratory   Initial Focused Assessment: I came by to see Ms. Stabenow this morning, she was alert and awake, breathing comfortably on BIPAP, RR 22, good oxygen saturations 95%. I asked her if she would like to trial off the BIPAP and eat a little breakfast, she nodded yes. I transitioned her to 4L [place], sat her up in the bed, and helped her get settled to eat. She denies being short of breath and denied pain. Ms. Fauss was tearful, I offered her some reassurance and left her call light within reach. VSS. Ms. Sudberry only nodded her head yes and no, follows commands.   Interventions: -- Transitioned to 4L [place] from BIPAP   Plan of Care: -- Monitor WOB/RR  -- Keep HOB > 30 if patient can tolerate -- BIPAP if patient is in distress or has increased work of breathing or dyspnea  Event Summary:  Start Time 0830 End Time 0900   Neveyah Garzon, [place]

## 2019-05-25 NOTE — Progress Notes (Signed)
Pt removed pulse oximeter from finger and would not allow this nurse to place it back. Placed pulse oximeter on pt's forehead. Will attempt to continue to monitor.

## 2019-05-25 NOTE — Progress Notes (Signed)
Patient ID: Krystal Cantu, female   DOB: November 07, 1960, 58 y.o.   MRN: [id number]   This NP visited patient at the bedside as a follow up for  palliative medicine needs and emotional support, I meet with family/daughter and brother at bedside for continued conversation regarding GOCs, transitions of care and EOL wishes.     Patient is OOB to chair, she is weak and dyspneic on minimal exertion.  Currently she is on 4 liters of O2.     We again discussed the diagnosis and prognosis.  Patient and family understand and are accepting of the likely limited prognosis and the patient is able to communicate that comfort and dignity are her priorities in care.  Plan of care discussed.  Plan of care; -DNR/DNI -No further BiPAP use, instead utilize PRN medications for symptom management    -Morphine IV/dyspnea and Ativan IV/agitation -monitor outcomes for appropriate disposition over the next 24-48 hours  Patient and family agree that if the patient stabilizes and is eligible for skilled nursing facility for rehab that would be the best outcome,   [facility] skilled nursing facility is their first choice. However if she declines patient and family are open to residential hospice for end-of-life care, their choice would be [facility]  Both patient and her family verbalize a sense of comfort and understanding of the likely limited prognosis secondary to her serious lung disease.  Questions and concerns addressed.   PMT will continue to support holistically  Discussed with Dr Doristine Bosworth and bedside RN Total time spent on the unit was 60 minutes  Greater than 50% of the time was spent in counseling and coordination of care  Wadie Lessen NP  Palliative Medicine Team Team Phone # [phone] Pager [phone]

## 2019-05-26 ENCOUNTER — Encounter (HOSPITAL_COMMUNITY): Payer: Self-pay

## 2019-05-26 DIAGNOSIS — F419 Anxiety disorder, unspecified: Secondary | ICD-10-CM

## 2019-05-26 LAB — GLUCOSE, CAPILLARY
Glucose-Capillary: 160 mg/dL — ABNORMAL HIGH (ref 70–99)
Glucose-Capillary: 177 mg/dL — ABNORMAL HIGH (ref 70–99)
Glucose-Capillary: 221 mg/dL — ABNORMAL HIGH (ref 70–99)
Glucose-Capillary: 258 mg/dL — ABNORMAL HIGH (ref 70–99)
Glucose-Capillary: 97 mg/dL (ref 70–99)
Glucose-Capillary: 98 mg/dL (ref 70–99)

## 2019-05-26 NOTE — Progress Notes (Signed)
Patient ID: Coramae Kellenberger, female   DOB: [birth year], 58 y.o.   MRN: [id number]   This NP spoke with bedside Rn and patient is doing well without BiPap.   She is comfortable and hopeful for SNF for rehab.   Plan of care; -DNR/DNI -No further BiPAP use, instead utilize PRN medications for symptom management    -Morphine IV/dyspnea and Ativan IV/agitation -continue to monitor outcomes  Patient and family agree that if the patient stabilizes and is eligible for skilled nursing facility for rehab that would be the best outcome,   [facility] skilled nursing facility is their first choice.   However if she declines patient and family are open to residential hospice for end-of-life care, their choice would be [facility]  Since she is doing well, move forward with transition of care to SNF for rehab with palliative services.  Questions and concerns addressed.   PMT will continue to support holistically  Discussed with  bedside RN No charge    Wadie Lessen NP  Palliative Medicine Team Team Phone # [phone] Pager [phone]

## 2019-05-26 NOTE — Progress Notes (Signed)
   Vital Signs MEWS/VS Documentation      05/26/2019 1910 05/26/2019 1930 05/26/2019 2112 05/26/2019 2242   MEWS Score:  3  4  4  4    MEWS Score Color:  Yellow  Red  Red  Red   Resp:  -  (!) 28  (!) 26  (!) 28   Pulse:  -  (!) 126  (!) 116  (!) 117   BP:  -  -  104/71  112/77   Temp:  -  -  98 F (36.7 C)  97.8 F (36.6 C)   O2 Device:  -  Nasal Cannula  Nasal Cannula  Nasal Cannula   O2 Flow Rate (L/min):  -  3 L/min  5 L/min  4 L/min       Patient assessed and is currently stable, she reports no symptoms of distress. Ativan was administered. Will continue to monitor.     Bohden Dung Assunta Gambles 05/26/2019,10:45 PM

## 2019-05-26 NOTE — Evaluation (Signed)
Clinical/Bedside Swallow Evaluation Patient Details  Name: Krystal Cantu MRN: [id number] Date of Birth: [birth year]  Today's Date: 05/26/2019 Time: SLP Start Time (ACUTE ONLY): 1150 SLP Stop Time (ACUTE ONLY): 1200 SLP Time Calculation (min) (ACUTE ONLY): 10 min  Past Medical History:  Past Medical History:  Diagnosis Date  . COPD (chronic obstructive pulmonary disease) ([place])    Past Surgical History: History reviewed. No pertinent surgical history. HPI:  Pt is a 58 year old admitted with Acute on chronic hypoxemic hypercarbic respiratory failure, Acute exacerbation of her underlying chronic respiratory disease. Appears to have h/o a smoking-related interstitial lung disease, COPD, and prior diagnosis of BOOP dating back from 2006. Severe restrictive lung disease at baseline. Disease state would be consist with end-stage fibrotic lung disease and DPLD, all per pulmonolgist.  She was extubated on 05/23/2019 and was subsequently transferred under [facility] care on 05/25/2019.  She was also seen by palliative care and was made DNR.  Palliative care met with her again on 05/25/2019 and based on their discussion with the patient and family, patient chose not to use even BiPAP. Prior clinical swallow eval in July appeared WNL.    Assessment / Plan / Recommendation Clinical Impression  Pt presents with normal swallow function, no signs of aspiration observed. Normal intake or regular solids. Resume a regular diet. No SLP f/u needed.  SLP Visit Diagnosis: Dysphagia, unspecified (R13.10)    Aspiration Risk  No limitations    Diet Recommendation Regular;Thin liquid   Liquid Administration via: Cup;Straw Medication Administration: Whole meds with liquid Supervision: Patient able to self feed Postural Changes: Seated upright at 90 degrees    Other  Recommendations Oral Care Recommendations: Oral care BID   Follow up Recommendations        Frequency and Duration            Prognosis         Swallow Study   General HPI: Pt is a 58 year old admitted with Acute on chronic hypoxemic hypercarbic respiratory failure, Acute exacerbation of her underlying chronic respiratory disease. Appears to have h/o a smoking-related interstitial lung disease, COPD, and prior diagnosis of BOOP dating back from 2006. Severe restrictive lung disease at baseline. Disease state would be consist with end-stage fibrotic lung disease and DPLD, all per pulmonolgist.  She was extubated on 05/23/2019 and was subsequently transferred under [facility] care on 05/25/2019.  She was also seen by palliative care and was made DNR.  Palliative care met with her again on 05/25/2019 and based on their discussion with the patient and family, patient chose not to use even BiPAP. Prior clinical swallow eval in July appeared WNL.  Type of Study: Bedside Swallow Evaluation Previous Swallow Assessment: yes Diet Prior to this Study: Thin liquids Temperature Spikes Noted: No Respiratory Status: Nasal cannula History of Recent Intubation: Yes Length of Intubations (days): 4 days Date extubated: 05/23/19 Behavior/Cognition: Alert;Cooperative;Pleasant mood Oral Cavity Assessment: Within Functional Limits Oral Care Completed by SLP: No Oral Cavity - Dentition: Adequate natural dentition Vision: Functional for self-feeding Self-Feeding Abilities: Able to feed self Patient Positioning: (edge of bed) Baseline Vocal Quality: Normal Volitional Cough: Strong Volitional Swallow: Able to elicit    Oral/Motor/Sensory Function Overall Oral Motor/Sensory Function: Within functional limits   Ice Chips     Thin Liquid Thin Liquid: Within functional limits    Nectar Thick Nectar Thick Liquid: Not tested   Honey Thick Honey Thick Liquid: Not tested   Puree Puree: Within functional limits  Solid     Solid: Within functional limits      Krystal Cantu, Katherene Ponto 05/26/2019,12:19 PM

## 2019-05-26 NOTE — Progress Notes (Signed)
   Vital Signs MEWS/VS Documentation      05/26/2019 1642 05/26/2019 1700 05/26/2019 1735 05/26/2019 1737   MEWS Score:  3  5  4  4    MEWS Score Color:  Yellow  Red  Red  Red   Resp:  (!) 22  (!) 31  (!) 30  (!) 27   Pulse:  (!) 112  (!) 129  (!) 111  (!) 116   BP:  103/65  -  -  -   Temp:  98.2 F (36.8 C)  -  -  -       Paged Pahwani MD. He requested I administer Ativan, Ativan administered. Patient assessed and is currently stable, she reports no symptoms of distress. Will continue to monitor.    Krystal Cantu 05/26/2019,5:42 PM

## 2019-05-26 NOTE — Progress Notes (Signed)
PROGRESS NOTE    Krystal Cantu  EXN:[id number] DOB: 16-Aug-1961 DOA: 05/20/2019 PCP: Bonnita Nasuti, MD   Brief Narrative:  This is a 58 year old female patient with a complicated pulmonary history as mentioned below followed by Dr. Melvyn Novas.  Last hospitalized in August 2020 for acute on chronic respiratory failure felt  Presented to the emergency room on 05/20/2019 via EMS after being found at home unresponsive on.  Apparently had been in normal state of health the night prior.  On EMS arrival Glasgow Coma Scale was 3 with pulse oximetry at 44% this improved with saturations up to 97% on supplemental oxygen but she remained unresponsive, did not improve with BiPAP so was placed on mechanical ventilator and subsequently transferred to [facility] for further evaluation.  > On further questioning she does endorse approximately 2 to 3 days of progressive shortness of breath, productive cough with white sputum, worsening wheezing and decreased effectiveness of short-term bronchodilators. > She has had secondhand smoke exposure.  She was extubated on 05/23/2019 and was subsequently transferred under [facility] care on 05/25/2019.  She was also seen by palliative care and was made DNR.  Palliative care met with her again on 05/25/2019 and based on their discussion with the patient and family, patient chose not to use even BiPAP.  Assessment & Plan:   Active Problems:   Dyspnea   COPD exacerbation (HCC)   Endotracheally intubated   Malnutrition of moderate degree   Palliative care by specialist   DNR (do not resuscitate) discussion   DNR (do not resuscitate)   Weakness generalized   Anxiety   Acute on chronic hypoxemic hypercarbic respiratory failure Acute exacerbation of her underlying chronic respiratory disease. Appears to have h/o a smoking-related interstitial lung disease, COPD, and prior diagnosis of BOOP dating back from 2006. Severe restrictive lung disease at baseline. Disease state would be  consist with end-stage fibrotic lung disease and DPLD  Likely secondary pulmonary hypertension to underlying lung disease, was on sildenafil in the past, which in her case could make v/q mismatch worse  I doubt she is a candidate for transplantation evaluation as she was smoking up until this admission  Plan Patient doing very well today post extubation.  Currently on 5 L of nasal oxygen.   We appreciate palliative care input.  Patient chose not to use BiPAP.  They continue to follow for possible hospice consideration.  She was made DNR.  Plan is to watch her in the hospital for 1-2 more days to see how she does and based on that, palliative care with medical decision whether she will go to SNF versus residential hospice. Complete additional few days of IV steroids at this order has been placed and tapered. Completed course of azithromycin Continue scheduled bronchodilators.  Hypotension, resolved Resolved continue to observe  Hyperglycemia : Controlled.  Continue SSI.  Anxiety: Continue PRN Xanax.  Physical deconditioning: Consulted PT.  DVT prophylaxis: Lovenox Code Status: DNR Family Communication:  None present at bedside.  Plan of care discussed with patient in length and he verbalized understanding and agreed with it. Disposition Plan: TBD  Consultants:   Palliative care  Procedures:   Intubation on 05/20/2019 and extubation on 05/23/2019  Antimicrobials:   Completed course of azithromycin   Subjective: Patient seen and examined.  Although she is on 5 L of oxygen but she looks much more comfortable and denied any shortness of breath and in fact she is more alert and oriented x4 today.  Objective: Vitals:  05/26/19 0325 05/26/19 0358 05/26/19 0452 05/26/19 0924  BP:  118/77    Pulse:  94    Resp:  (!) 27    Temp:  98.8 F (37.1 C)  98.3 F (36.8 C)  TempSrc:  Oral  Oral  SpO2: 96% 100%    Weight:   42.3 kg   Height:        Intake/Output Summary (Last 24  hours) at 05/26/2019 0932 Last data filed at 05/25/2019 1016 Gross per 24 hour  Intake -  Output 700 ml  Net -700 ml   Filed Weights   05/24/19 0144 05/25/19 0423 05/26/19 0452  Weight: 42.1 kg 42 kg 42.3 kg    Examination:  General exam: Appears calm and comfortable  Respiratory system: Rhonchi with coarse breath sounds bilaterally. Respiratory effort normal. Cardiovascular system: S1 & S2 heard, RRR. No JVD, murmurs, rubs, gallops or clicks. No pedal edema. Gastrointestinal system: Abdomen is nondistended, soft and nontender. No organomegaly or masses felt. Normal bowel sounds heard. Central nervous system: Alert and oriented. No focal neurological deficits. Extremities: Symmetric 5 x 5 power. Skin: No rashes, lesions or ulcers.  Psychiatry: Judgement and insight appear poor. Mood & affect appropriate.    Data Reviewed: I have personally reviewed following labs and imaging studies  CBC: Recent Labs  Lab 05/20/19 1502 05/20/19 1535 05/21/19 0547 05/22/19 0357  WBC 11.8*  --  9.4 5.8  NEUTROABS 11.1*  --  8.6*  --   HGB 8.3* 8.8* 7.3* 7.1*  HCT 28.2* 26.0* 25.1* 22.9*  MCV 103.3*  --  103.3* 98.3  PLT 352  --  314 517   Basic Metabolic Panel: Recent Labs  Lab 05/20/19 1502 05/20/19 1535 05/21/19 0547 05/21/19 1408 05/22/19 0357 05/22/19 1632 05/23/19 0827  NA 136 133* 134*  --  135  --  137  K 4.4 4.0 3.6  --  3.2*  --  4.1  CL 91*  --  92*  --  94*  --  95*  CO2 35*  --  35*  --  34*  --  34*  GLUCOSE 147*  --  120*  --  162*  --  170*  BUN 15  --  13  --  30*  --  25*  CREATININE 0.73  --  0.59  --  0.56  --  0.55  CALCIUM 6.9*  --  7.0*  --  7.1*  --  8.3*  MG 2.3  --   --  2.1 2.8* 2.2  --   PHOS 2.4*  --   --  1.5* 1.4* 2.4*  --    GFR: Estimated Creatinine Clearance: 51.2 mL/min (by C-G formula based on SCr of 0.55 mg/dL). Liver Function Tests: Recent Labs  Lab 05/20/19 1502  AST 22  ALT 19  ALKPHOS 73  BILITOT 0.7  PROT 6.1*  ALBUMIN 2.3*    No results for input(s): LIPASE, AMYLASE in the last 168 hours. No results for input(s): AMMONIA in the last 168 hours. Coagulation Profile: No results for input(s): INR, PROTIME in the last 168 hours. Cardiac Enzymes: No results for input(s): CKTOTAL, CKMB, CKMBINDEX, TROPONINI in the last 168 hours. BNP (last 3 results) No results for input(s): PROBNP in the last 8760 hours. HbA1C: No results for input(s): HGBA1C in the last 72 hours. CBG: Recent Labs  Lab 05/25/19 1533 05/25/19 1955 05/25/19 2358 05/26/19 0355 05/26/19 0753  GLUCAP 124* 135* 107* 98 97   Lipid Profile: No results for  input(s): CHOL, HDL, LDLCALC, TRIG, CHOLHDL, LDLDIRECT in the last 72 hours. Thyroid Function Tests: No results for input(s): TSH, T4TOTAL, FREET4, T3FREE, THYROIDAB in the last 72 hours. Anemia Panel: No results for input(s): VITAMINB12, FOLATE, FERRITIN, TIBC, IRON, RETICCTPCT in the last 72 hours. Sepsis Labs: Recent Labs  Lab 05/20/19 1502  PROCALCITON 1.46    Recent Results (from the past 240 hour(s))  MRSA PCR Screening     Status: None   Collection Time: 05/20/19  4:29 PM   Specimen: Nasal Mucosa; Nasopharyngeal  Result Value Ref Range Status   MRSA by PCR NEGATIVE NEGATIVE Final    Comment:        The GeneXpert MRSA Assay (FDA approved for NASAL specimens only), is one component of a comprehensive MRSA colonization surveillance program. It is not intended to diagnose MRSA infection nor to guide or monitor treatment for MRSA infections. Performed at [facility], [place] [address] [place], [place] [address]       Radiology Studies: No results found.  Scheduled Meds: . budesonide (PULMICORT) nebulizer solution  0.5 mg Nebulization BID  . chlorhexidine gluconate (MEDLINE KIT)  15 mL Mouth Rinse BID  . Chlorhexidine Gluconate Cloth  6 each Topical Daily  . famotidine  20 mg Oral QHS  . feeding supplement (ENSURE ENLIVE)  237 mL Oral TID BM  . feeding supplement  (PRO-STAT SUGAR FREE 64)  30 mL Oral TID WC  . gabapentin  300 mg Oral Q8H  . insulin aspart  0-15 Units Subcutaneous Q4H  . ipratropium-albuterol  3 mL Nebulization Q4H  . mouth rinse  15 mL Mouth Rinse 10 times per day  . Melatonin  3 mg Per Tube QHS  . methylPREDNISolone (SOLU-MEDROL) injection  40 mg Intravenous Daily  . venlafaxine  75 mg Oral BID WC   Continuous Infusions: . sodium chloride 10 mL/hr at 05/24/19 0600     LOS: 6 days   Time spent: 28 minutes   Darliss Cheney, MD [facility] Pager [phone]  If 7PM-7AM, please contact night-coverage [email or web address] Password [facility] 05/26/2019, 9:32 AM

## 2019-05-27 LAB — GLUCOSE, CAPILLARY
Glucose-Capillary: 122 mg/dL — ABNORMAL HIGH (ref 70–99)
Glucose-Capillary: 126 mg/dL — ABNORMAL HIGH (ref 70–99)
Glucose-Capillary: 141 mg/dL — ABNORMAL HIGH (ref 70–99)
Glucose-Capillary: 170 mg/dL — ABNORMAL HIGH (ref 70–99)
Glucose-Capillary: 167 mg/dL — ABNORMAL HIGH (ref 70–99)

## 2019-05-27 MED ORDER — IPRATROPIUM-ALBUTEROL 0.5-2.5 (3) MG/3ML IN SOLN
3.0000 mL | Freq: Four times a day (QID) | RESPIRATORY_TRACT | Status: DC
  Administered 2019-05-27 – 2019-06-01 (×20): 3 mL via RESPIRATORY_TRACT
  Filled 2019-05-27 (×20): qty 3

## 2019-05-27 MED ORDER — METOPROLOL TARTRATE 12.5 MG HALF TABLET
12.5000 mg | ORAL_TABLET | Freq: Two times a day (BID) | ORAL | Status: DC
  Administered 2019-05-27 – 2019-06-08 (×21): 12.5 mg via ORAL
  Filled 2019-05-27 (×24): qty 1

## 2019-05-27 NOTE — TOC Initial Note (Signed)
Transition of Care [facility]) - Initial/Assessment Note    Patient Details  Name: Krystal Cantu MRN: [id number] Date of Birth: 07/28/1961  Transition of Care [facility]) CM/SW Contact:    Alexander Mt, [place] Phone Number: 05/27/2019, 10:07 AM  Clinical Narrative:                 CSW acknowledging consult for SNF placement at [facility].  CSW called pt telephonically to complete assessment.  Pt from home alone, her daughter and elder brother check in on her regularly. She usually has oxygen but was more independent prior to admissions. She has only been to [facility] and that is her preference. We discussed [facility] only if there is not [facility] [place] beds.   CSW aware that pt engaged in ongoing [place] discussions with primary team and PMT. CSW will send referral and leave hand off for team to f/u on Monday when insurance company is reopen and [facility] perhaps more clear.   Expected Discharge Plan: [place] Barriers to Discharge: SNF Pending bed offer, Continued Medical Work up(pt still engaging in ongoing [place] with PMT)   Patient Goals and CMS Choice Patient states their goals for this hospitalization and ongoing recovery are:: to be close to home/get stronger if possible CMS Medicare.gov Compare Post Acute Care list provided to:: Patient Choice offered to / list presented to : Patient  Expected Discharge Plan and Services Expected Discharge Plan: [place] In-house Referral: Clinical Social Work, Hospice / Palliative Care Discharge Planning Services: CM Consult Post Acute Care Choice: [place] Living arrangements for the past 2 months: Single Family Home  Prior Living Arrangements/Services Living arrangements for the past 2 months: Single Family Home Lives with:: Self Patient language and need for interpreter reviewed:: Yes(no needs) Do you feel safe going back to the place where you live?: Yes      Need for Family Participation in Patient  Care: Yes (Comment)(supervision/assistance with ADL and IADLs) Care giver support system in place?: Yes (comment) Current home services: DME Criminal Activity/Legal Involvement Pertinent to Current Situation/Hospitalization: No - Comment as needed  Activities of Daily Living Home Assistive Devices/Equipment: None ADL Screening (condition at time of admission) Patient's cognitive ability adequate to safely complete daily activities?: Yes Is the patient deaf or have difficulty hearing?: No Does the patient have difficulty seeing, even when wearing glasses/contacts?: No Does the patient have difficulty concentrating, remembering, or making decisions?: Yes Patient able to express need for assistance with ADLs?: Yes Does the patient have difficulty dressing or bathing?: Yes Independently performs ADLs?: No Communication: Independent Dressing (OT): Needs assistance Is this a change from baseline?: Change from baseline, expected to last >3 days Grooming: Needs assistance Is this a change from baseline?: Change from baseline, expected to last >3 days Feeding: Independent Bathing: Needs assistance Is this a change from baseline?: Change from baseline, expected to last >3 days Toileting: Needs assistance Is this a change from baseline?: Change from baseline, expected to last >3days In/Out Bed: Independent Walks in Home: Independent Does the patient have difficulty walking or climbing stairs?: No Weakness of Legs: Both Weakness of Arms/Hands: None  Permission Sought/Granted Permission sought to share information with : Family Supports Permission granted to share information with : Yes, Release of Information Signed  Share Information with NAME: Carinne Barina  Permission granted to share info w AGENCY: [facility] [place] ([facility] if no beds in [place])  Permission granted to share info w Relationship: daughter  Permission granted to share  info w Contact Information: [phone]  Emotional  Assessment Appearance:: (telephonic assessment) Attitude/Demeanor/Rapport: Engaged(telephonic assessment) Affect (typically observed): Accepting(telephonic assessment) Orientation: : Oriented to Self, Oriented to Place, Oriented to  Time, Oriented to Situation Alcohol / Substance Use: Tobacco Use Psych Involvement: Outpatient Provider  Admission diagnosis:  RESPIRATORY FAILURE PNEUMONIA HYPOTENSION Patient Active Problem List   Diagnosis Date Noted  . Anxiety   . Malnutrition of moderate degree 05/23/2019  . Palliative care by specialist   . DNR (do not resuscitate) discussion   . DNR (do not resuscitate)   . Weakness generalized   . COPD exacerbation ([place]) 05/20/2019  . Endotracheally intubated   . Acute on chronic respiratory failure with hypoxia and hypercapnia ([place]) 03/23/2019  . ILD (interstitial lung disease) ([place]) 10/15/2017  . Chronic respiratory failure with hypoxia and hypercapnia ([place]) 10/15/2017  . Chronic low back pain 10/11/2017  . Cardiomyopathy ([place]) 10/11/2017  . Fibromyalgia 09/21/2017  . Thoracic spondylosis without myelopathy 09/21/2017  . Degenerative disc disease, lumbar 09/15/2012  . GERD 11/11/2009  . NEVUS 10/07/2009  . GENERALIZED ANXIETY DISORDER 09/26/2009  . CONSTIPATION 09/26/2009  . IBS 09/26/2009  . BLIND LOOP SYNDROME 09/26/2009  . ABDOMINAL DISTENSION 09/06/2009  . Cigarette smoker 02/27/2009  . COPD GOLD 0 02/27/2009  . OTH SPEC ALVEOL&PARIETOALVEOL PNEUMONOPATHIES 02/27/2009  . Dyspnea 02/27/2009   PCP:  Bonnita Nasuti, MD Pharmacy:   [facility], [place] [address] Phone: [phone] Fax: [phone]     Social Determinants of Health (SDOH) Interventions    Readmission Risk Interventions No flowsheet data found.

## 2019-05-27 NOTE — Progress Notes (Signed)
PROGRESS NOTE    Krystal Cantu  FYB:[id number] DOB: May 27, 1961 DOA: 05/20/2019 PCP: Bonnita Nasuti, MD   Brief Narrative:  This is a 58 year old female patient with a complicated pulmonary history as mentioned below followed by Dr. Melvyn Novas.  Last hospitalized in August 2020 for acute on chronic respiratory failure felt  Presented to the emergency room on 05/20/2019 via EMS after being found at home unresponsive on.  Apparently had been in normal state of health the night prior.  On EMS arrival Glasgow Coma Scale was 3 with pulse oximetry at 44% this improved with saturations up to 97% on supplemental oxygen but she remained unresponsive, did not improve with BiPAP so was placed on mechanical ventilator and subsequently transferred to [facility] for further evaluation.  > On further questioning she does endorse approximately 2 to 3 days of progressive shortness of breath, productive cough with white sputum, worsening wheezing and decreased effectiveness of short-term bronchodilators. > She has had secondhand smoke exposure.  She was extubated on 05/23/2019 and was subsequently transferred under [facility] care on 05/25/2019.  She was also seen by palliative care and was made DNR.  Palliative care met with her again on 05/25/2019 and based on their discussion with the patient and family, patient chose not to use even BiPAP.  Assessment & Plan:   Active Problems:   Dyspnea   COPD exacerbation (HCC)   Endotracheally intubated   Malnutrition of moderate degree   Palliative care by specialist   DNR (do not resuscitate) discussion   DNR (do not resuscitate)   Weakness generalized   Anxiety   Acute on chronic hypoxemic hypercarbic respiratory failure Acute exacerbation of her underlying chronic respiratory disease. Appears to have h/o a smoking-related interstitial lung disease, COPD, and prior diagnosis of BOOP dating back from 2006. Severe restrictive lung disease at baseline. 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  Currently on 5 L of nasal oxygen.   We appreciate palliative care input.  Patient chose not to use BiPAP.  They continue to follow for possible hospice consideration.  She was made DNR.  Plan is to watch her in the hospital for 1-2 more days to see how she does and based on that, palliative care with medical decision whether she will go to SNF versus residential hospice. Completed course of azithromycin Continue scheduled bronchodilators.  Now that she has been stable and not declining, we will pursue SNF discharge.  Case manager on board.  Hypotension, resolved Resolved continue to observe  Hyperglycemia : Controlled.  Continue SSI.  Anxiety: Continue PRN Xanax.  Physical deconditioning: Consulted PT.  Intermittent sinus tachycardia: Has been having intermittent sinus tachycardia since yesterday.  Will start on Lopressor 12.5 mg p.o. twice daily.  Watch blood pressure closely.  Will be using PRN Ativan for possible anxiety causing tachycardia as well.  DVT prophylaxis: Lovenox Code Status: DNR Family Communication:  None present at bedside.  Plan of care discussed with patient in length and he verbalized understanding and agreed with it. Disposition Plan: TBD  Consultants:   Palliative care  Procedures:   Intubation on 05/20/2019 and extubation on 05/23/2019  Antimicrobials:   Completed course of azithromycin  Subjective: Patient seen and examined.  She is sitting at the bed eating her breakfast.  She is on 5 L oxygen but denies any shortness of breath.  Objective: Vitals:   05/27/19 0722 05/27/19 0724 05/27/19 0808 05/27/19 0910  BP:    (!) 88/56 111/69  Pulse:   97 97  Resp:   (!) 23 (!) 28  Temp:   98.7 F (37.1 C)   TempSrc:   Oral   SpO2: 97% 96% 93% 100%  Weight:      Height:        Intake/Output Summary (Last 24 hours) at 05/27/2019 1100 Last data filed at 05/27/2019 0615 Gross per 24 hour  Intake 118 ml  Output 900 ml  Net -782 ml   Filed Weights   05/25/19 0423 05/26/19 0452 05/27/19 0551  Weight: 42 kg 42.3 kg 42.5 kg    Examination:  General exam: Appears calm and comfortable  Respiratory system: Coarse breath sounds with rhonchi bilaterally.  Respiratory effort normal. Cardiovascular system: S1 & S2 heard, RRR. No JVD, murmurs, rubs, gallops or clicks. No pedal edema. Gastrointestinal system: Abdomen is nondistended, soft and nontender. No organomegaly or masses felt. Normal bowel sounds heard. Central nervous system: Alert and oriented. No focal neurological deficits. Extremities: Symmetric 5 x 5 power. Skin: No rashes, lesions or ulcers.  Psychiatry: Judgement and insight appear normal. Mood & affect appropriate.   Data Reviewed: I have personally reviewed following labs and imaging studies  CBC: Recent Labs  Lab 05/20/19 1502 05/20/19 1535 05/21/19 0547 05/22/19 0357  WBC 11.8*  --  9.4 5.8  NEUTROABS 11.1*  --  8.6*  --   HGB 8.3* 8.8* 7.3* 7.1*  HCT 28.2* 26.0* 25.1* 22.9*  MCV 103.3*  --  103.3* 98.3  PLT 352  --  314 355   Basic Metabolic Panel: Recent Labs  Lab 05/20/19 1502 05/20/19 1535 05/21/19 0547 05/21/19 1408 05/22/19 0357 05/22/19 1632 05/23/19 0827  NA 136 133* 134*  --  135  --  137  K 4.4 4.0 3.6  --  3.2*  --  4.1  CL 91*  --  92*  --  94*  --  95*  CO2 35*  --  35*  --  34*  --  34*  GLUCOSE 147*  --  120*  --  162*  --  170*  BUN 15  --  13  --  30*  --  25*  CREATININE 0.73  --  0.59  --  0.56  --  0.55  CALCIUM 6.9*  --  7.0*  --  7.1*  --  8.3*  MG 2.3  --   --  2.1 2.8* 2.2  --   PHOS 2.4*  --   --  1.5* 1.4* 2.4*  --    GFR: Estimated  Creatinine Clearance: 51.4 mL/min (by C-G formula based on SCr of 0.55 mg/dL). Liver Function Tests: Recent Labs  Lab 05/20/19 1502  AST 22  ALT 19  ALKPHOS 73  BILITOT 0.7  PROT 6.1*  ALBUMIN 2.3*   No results for input(s): LIPASE, AMYLASE in the last 168 hours. No results for input(s): AMMONIA in the last 168 hours. Coagulation Profile: No results for input(s): INR, PROTIME in the last 168 hours. Cardiac Enzymes: No results for input(s): CKTOTAL, CKMB, CKMBINDEX, TROPONINI in the last 168 hours. BNP (last 3 results) No results for input(s): PROBNP in the last 8760 hours. HbA1C: No results for input(s): HGBA1C in the  last 72 hours. CBG: Recent Labs  Lab 05/26/19 1523 05/26/19 2026 05/26/19 2356 05/27/19 0419 05/27/19 0811  GLUCAP 160* 221* 177* 122* 126*   Lipid Profile: No results for input(s): CHOL, HDL, LDLCALC, TRIG, CHOLHDL, LDLDIRECT in the last 72 hours. Thyroid Function Tests: No results for input(s): TSH, T4TOTAL, FREET4, T3FREE, THYROIDAB in the last 72 hours. Anemia Panel: No results for input(s): VITAMINB12, FOLATE, FERRITIN, TIBC, IRON, RETICCTPCT in the last 72 hours. Sepsis Labs: Recent Labs  Lab 05/20/19 1502  PROCALCITON 1.46    Recent Results (from the past 240 hour(s))  MRSA PCR Screening     Status: None   Collection Time: 05/20/19  4:29 PM   Specimen: Nasal Mucosa; Nasopharyngeal  Result Value Ref Range Status   MRSA by PCR NEGATIVE NEGATIVE Final    Comment:        The GeneXpert MRSA Assay (FDA approved for NASAL specimens only), is one component of a comprehensive MRSA colonization surveillance program. It is not intended to diagnose MRSA infection nor to guide or monitor treatment for MRSA infections. Performed at [facility], [place] [address], [place], [place] [address]       Radiology Studies: No results found.  Scheduled Meds: . budesonide (PULMICORT) nebulizer solution  0.5 mg Nebulization BID  . chlorhexidine  gluconate (MEDLINE KIT)  15 mL Mouth Rinse BID  . Chlorhexidine Gluconate Cloth  6 each Topical Daily  . famotidine  20 mg Oral QHS  . feeding supplement (ENSURE ENLIVE)  237 mL Oral TID BM  . feeding supplement (PRO-STAT SUGAR FREE 64)  30 mL Oral TID WC  . gabapentin  300 mg Oral Q8H  . insulin aspart  0-15 Units Subcutaneous Q4H  . ipratropium-albuterol  3 mL Nebulization QID  . mouth rinse  15 mL Mouth Rinse 10 times per day  . Melatonin  3 mg Per Tube QHS  . metoprolol tartrate  12.5 mg Oral BID  . venlafaxine  75 mg Oral BID WC   Continuous Infusions: . sodium chloride 10 mL/hr at 05/24/19 0600     LOS: 7 days   Time spent: 25 minutes   Darliss Cheney, MD [facility] Pager [phone]  If 7PM-7AM, please contact night-coverage [email or web address] Password [email or web address] 05/27/2019, 11:00 AM

## 2019-05-27 NOTE — Progress Notes (Signed)
   Vital Signs MEWS/VS Documentation      05/27/2019 1100 05/27/2019 1133 05/27/2019 1300 05/27/2019 1516   MEWS Score:  1  -  0  -   MEWS Score Color:  Green  -  Green  -   Resp:  (!) 22  -  19  -   Pulse:  86  -  78  -   O2 Device:  -  Nasal Cannula  -  Nasal Cannula   O2 Flow Rate (L/min):  -  5 L/min  -  5 L/min      Patient vital signs: BP 111/69, HR 97, RR 28, saturation 100% High Flow. Patient is not having any respiratory distress, she continue to be alert and oriented.        [place], [place] R 05/27/2019,5:12 PM

## 2019-05-27 NOTE — NC FL2 (Signed)
[place] LEVEL OF CARE SCREENING TOOL     IDENTIFICATION  Patient Name: Krystal Cantu Birthdate: May 09, 1961 Sex: female Admission Date (Current Location): 05/20/2019  [place] and [place] Number:  [name] [id number] [place] and Address:  [facility], [place] [address] [place], [place] [address]      Provider Number: [id number]  Attending Physician Name and Address:  Darliss Cheney, MD  Relative Name and Phone Number:  Jisel Fleet, daughter, [phone]    Current Level of Care: Hospital Recommended Level of Care: [place] Prior Approval Number:    Date Approved/Denied:   PASRR Number: [id number]  Discharge Plan: SNF    Current Diagnoses: Patient Active Problem List   Diagnosis Date Noted  . Anxiety   . Malnutrition of moderate degree 05/23/2019  . Palliative care by specialist   . DNR (do not resuscitate) discussion   . DNR (do not resuscitate)   . Weakness generalized   . COPD exacerbation ([place]) 05/20/2019  . Endotracheally intubated   . Acute on chronic respiratory failure with hypoxia and hypercapnia ([place]) 03/23/2019  . ILD (interstitial lung disease) ([place]) 10/15/2017  . Chronic respiratory failure with hypoxia and hypercapnia ([place]) 10/15/2017  . Chronic low back pain 10/11/2017  . Cardiomyopathy ([place]) 10/11/2017  . Fibromyalgia 09/21/2017  . Thoracic spondylosis without myelopathy 09/21/2017  . Degenerative disc disease, lumbar 09/15/2012  . GERD 11/11/2009  . NEVUS 10/07/2009  . GENERALIZED ANXIETY DISORDER 09/26/2009  . CONSTIPATION 09/26/2009  . IBS 09/26/2009  . BLIND LOOP SYNDROME 09/26/2009  . ABDOMINAL DISTENSION 09/06/2009  . Cigarette smoker 02/27/2009  . COPD GOLD 0 02/27/2009  . OTH SPEC ALVEOL&PARIETOALVEOL PNEUMONOPATHIES 02/27/2009  . Dyspnea 02/27/2009    Orientation RESPIRATION BLADDER Height & Weight     Self, Place, Time, Situation  O2(5L nasal canula) Continent, External  catheter Weight: 93 lb 11.1 oz (42.5 kg) Height:  '5\' 4"'$  (162.6 cm)  BEHAVIORAL SYMPTOMS/MOOD NEUROLOGICAL BOWEL NUTRITION STATUS      Continent Diet(see discharge summary)  AMBULATORY STATUS COMMUNICATION OF NEEDS Skin   Extensive Assist Verbally Other (Comment)(generalized ecchymosis bilateral arms)                       Personal Care Assistance Level of Assistance  Bathing, Feeding, Dressing Bathing Assistance: Maximum assistance Feeding assistance: Limited assistance Dressing Assistance: Maximum assistance     Functional Limitations Info  Sight, Hearing, Speech Sight Info: Adequate Hearing Info: Adequate Speech Info: Adequate    SPECIAL CARE FACTORS FREQUENCY  PT (By licensed PT), OT (By licensed OT)     PT Frequency: 5x week OT Frequency: 5x week            Contractures Contractures Info: Not present    Additional Factors Info  Code Status, Allergies, Psychotropic, Insulin Sliding Scale(Pt on Klonopin, Xanax and Effexor - dx of general anxiety disorder) Code Status Info: DNR Allergies Info: Ceftriaxone, Pregabalin, Codeine, Itraconazole, Sulfamethoxazole Psychotropic Info: venlafaxine (EFFEXOR) tablet 75 mg 2x daily with meals Insulin Sliding Scale Info: insulin aspart (novoLOG) injection 0-15 Units every four hours       Current Medications (05/27/2019):  This is the current hospital active medication list Current Facility-Administered Medications  Medication Dose Route Frequency Provider Last Rate Last Dose  . 0.9 %  sodium chloride infusion  250 mL Intravenous Continuous Erick Colace, NP 10 mL/hr at 05/24/19 0600    . albuterol (PROVENTIL) (2.5 MG/3ML) 0.083% nebulizer solution 2.5 mg  2.5 mg Nebulization Once PRN Candee Furbish, MD      . budesonide (PULMICORT) nebulizer solution 0.5 mg  0.5 mg Nebulization BID Icard, Bradley L, DO   0.5 mg at 05/27/19 0722  . chlorhexidine gluconate (MEDLINE KIT) (PERIDEX) 0.12 % solution 15 mL  15 mL Mouth Rinse BID  Candee Furbish, MD   15 mL at 05/26/19 0837  . Chlorhexidine Gluconate Cloth 2 % PADS 6 each  6 each Topical Daily Candee Furbish, MD   6 each at 05/24/19 1000  . famotidine (PEPCID) tablet 20 mg  20 mg Oral QHS Icard, Bradley L, DO   20 mg at 05/26/19 2121  . feeding supplement (ENSURE ENLIVE) (ENSURE ENLIVE) liquid 237 mL  237 mL Oral TID BM Icard, Bradley L, DO   237 mL at 05/26/19 2123  . feeding supplement (PRO-STAT SUGAR FREE 64) liquid 30 mL  30 mL Oral TID WC Icard, Bradley L, DO   30 mL at 05/27/19 0910  . gabapentin (NEURONTIN) 250 MG/5ML solution 300 mg  300 mg Oral Q8H Candee Furbish, MD   300 mg at 05/27/19 0610  . insulin aspart (novoLOG) injection 0-15 Units  0-15 Units Subcutaneous Q4H Erick Colace, NP   2 Units at 05/27/19 0908  . ipratropium-albuterol (DUONEB) 0.5-2.5 (3) MG/3ML nebulizer solution 3 mL  3 mL Nebulization QID Icard, Bradley L, DO   3 mL at 05/27/19 0722  . lidocaine (XYLOCAINE) 2 % viscous mouth solution 15 mL  15 mL Mouth/Throat Q3H PRN Cristal Generous, NP   15 mL at 05/23/19 0812  . LORazepam (ATIVAN) injection 1 mg  1 mg Intravenous Q4H PRN Knox Royalty, NP   1 mg at 05/26/19 2233  . MEDLINE mouth rinse  15 mL Mouth Rinse 10 times per day Candee Furbish, MD   15 mL at 05/26/19 1225  . Melatonin TABS 3 mg  3 mg Per Tube QHS Einar Grad, RPH   3 mg at 05/26/19 2121  . metoprolol tartrate (LOPRESSOR) tablet 12.5 mg  12.5 mg Oral BID Darliss Cheney, MD   12.5 mg at 05/27/19 0910  . morphine 2 MG/ML injection 1 mg  1 mg Intravenous Q2H PRN Knox Royalty, NP      . venlafaxine [facility]) tablet 75 mg  75 mg Oral BID WC Candee Furbish, MD   75 mg at 05/27/19 6606     Discharge Medications: Please see discharge summary for a list of discharge medications.  Relevant Imaging Results:  Relevant Lab Results:   Additional Information SS# [id number]  [place], [place]

## 2019-05-28 LAB — CBC WITH DIFFERENTIAL/PLATELET
Abs Immature Granulocytes: 0.36 10*3/uL — ABNORMAL HIGH (ref 0.00–0.07)
Basophils Absolute: 0 10*3/uL (ref 0.0–0.1)
Basophils Relative: 0 %
Eosinophils Absolute: 0.2 10*3/uL (ref 0.0–0.5)
Eosinophils Relative: 2 %
HCT: 31.2 % — ABNORMAL LOW (ref 36.0–46.0)
Hemoglobin: 9.4 g/dL — ABNORMAL LOW (ref 12.0–15.0)
Immature Granulocytes: 4 %
Lymphocytes Relative: 27 %
Lymphs Abs: 2.7 10*3/uL (ref 0.7–4.0)
MCH: 30.1 pg (ref 26.0–34.0)
MCHC: 30.1 g/dL (ref 30.0–36.0)
MCV: 100 fL (ref 80.0–100.0)
Monocytes Absolute: 0.8 10*3/uL (ref 0.1–1.0)
Monocytes Relative: 8 %
Neutro Abs: 5.7 10*3/uL (ref 1.7–7.7)
Neutrophils Relative %: 59 %
Platelets: 455 10*3/uL — ABNORMAL HIGH (ref 150–400)
RBC: 3.12 MIL/uL — ABNORMAL LOW (ref 3.87–5.11)
RDW: 17.4 % — ABNORMAL HIGH (ref 11.5–15.5)
WBC: 9.8 10*3/uL (ref 4.0–10.5)
nRBC: 0 % (ref 0.0–0.2)

## 2019-05-28 LAB — GLUCOSE, CAPILLARY
Glucose-Capillary: 118 mg/dL — ABNORMAL HIGH (ref 70–99)
Glucose-Capillary: 124 mg/dL — ABNORMAL HIGH (ref 70–99)
Glucose-Capillary: 128 mg/dL — ABNORMAL HIGH (ref 70–99)
Glucose-Capillary: 154 mg/dL — ABNORMAL HIGH (ref 70–99)
Glucose-Capillary: 155 mg/dL — ABNORMAL HIGH (ref 70–99)
Glucose-Capillary: 157 mg/dL — ABNORMAL HIGH (ref 70–99)
Glucose-Capillary: 89 mg/dL (ref 70–99)
Glucose-Capillary: 99 mg/dL (ref 70–99)

## 2019-05-28 LAB — COMPREHENSIVE METABOLIC PANEL
ALT: 21 U/L (ref 0–44)
AST: 11 U/L — ABNORMAL LOW (ref 15–41)
Albumin: 2.6 g/dL — ABNORMAL LOW (ref 3.5–5.0)
Alkaline Phosphatase: 72 U/L (ref 38–126)
Anion gap: 12 (ref 5–15)
BUN: 32 mg/dL — ABNORMAL HIGH (ref 6–20)
CO2: 39 mmol/L — ABNORMAL HIGH (ref 22–32)
Calcium: 10.4 mg/dL — ABNORMAL HIGH (ref 8.9–10.3)
Chloride: 87 mmol/L — ABNORMAL LOW (ref 98–111)
Creatinine, Ser: 0.66 mg/dL (ref 0.44–1.00)
GFR calc Af Amer: >60 mL/min (ref >60)
GFR calc non Af Amer: >60 mL/min (ref >60)
Glucose, Bld: 83 mg/dL (ref 70–99)
Potassium: 4.8 mmol/L (ref 3.5–5.1)
Sodium: 138 mmol/L (ref 135–145)
Total Bilirubin: 0.2 mg/dL — ABNORMAL LOW (ref 0.3–1.2)
Total Protein: 5.6 g/dL — ABNORMAL LOW (ref 6.5–8.1)

## 2019-05-28 MED ORDER — LORAZEPAM 1 MG PO TABS
1.0000 mg | ORAL_TABLET | Freq: Four times a day (QID) | ORAL | Status: DC | PRN
  Administered 2019-05-28 – 2019-06-08 (×21): 1 mg via ORAL
  Filled 2019-05-28 (×21): qty 1

## 2019-05-28 MED ORDER — MORPHINE SULFATE (CONCENTRATE) 10 MG/0.5ML PO SOLN
5.0000 mg | ORAL | Status: DC | PRN
  Administered 2019-05-31 – 2019-06-03 (×2): 5 mg via ORAL
  Filled 2019-05-28 (×2): qty 0.5

## 2019-05-28 NOTE — Progress Notes (Signed)
Patient ID: Aubryella Hoose, female   DOB: 1960/11/20, 58 y.o.   MRN: [id number]   This NP visited patient at bedside for palliative medicine needs and emotional support. Patient tells me she is "tired this morning", she did not sleep well last night.  Patient continues to remain stable on current treatment plan and path however she remains high risk for decompensation secondary to her pulmonary disease.   Plan of care; -DNR/DNI -No further BiPAP use, instead utilize PRN medications for symptom management    -Morphine IV/dyspnea and Ativan IV/agitation --will convert to oral today in anticipation for discharge -continue to monitor outcomes  Patient and family agree that if the patient stabilizes and is eligible for skilled nursing facility for rehab that would be the best outcome,   [facility] skilled nursing facility is their first choice. According to EMR social work is working on placement.  However if she declines patient and family are open to residential hospice for end-of-life care, their choice would be [facility] residential hospice.  I spoke to patient's brother Octavia Bruckner by telephone and daughter Wells Guiles # [phone], they have no questions or concerns.  All understand and agree with the plan   Questions and concerns addressed.   PMT will continue to support holistically  Discussed with  bedside RN and Dr Doristine Bosworth Total time spent on the unit was 35 minutes.  Greater than 50% of the time was spent in counseling and coordination of care.    Wadie Lessen NP  Palliative Medicine Team Team Phone # [phone] Pager [phone]

## 2019-05-28 NOTE — Plan of Care (Signed)
  Problem: Education: Goal: Knowledge of General Education information will improve Description: Including pain rating scale, medication(s)/side effects and non-pharmacologic comfort measures Outcome: Progressing   Problem: Coping: Goal: Level of anxiety will decrease Outcome: Progressing   Problem: Elimination: Goal: Will not experience complications related to urinary retention Outcome: Progressing   Problem: Pain Managment: Goal: General experience of comfort will improve Outcome: Progressing

## 2019-05-28 NOTE — Progress Notes (Signed)
PROGRESS NOTE    Krystal Cantu  ACZ:[id number] DOB: 1961-02-03 DOA: 05/20/2019 PCP: Bonnita Nasuti, MD   Brief Narrative:  This is a 58 year old female patient with a complicated pulmonary history as mentioned below followed by Dr. Melvyn Novas.  Last hospitalized in August 2020 for acute on chronic respiratory failure felt  Presented to the emergency room on 05/20/2019 via EMS after being found at home unresponsive on.  Apparently had been in normal state of health the night prior.  On EMS arrival Glasgow Coma Scale was 3 with pulse oximetry at 44% this improved with saturations up to 97% on supplemental oxygen but she remained unresponsive, did not improve with BiPAP so was placed on mechanical ventilator and subsequently transferred to [facility] for further evaluation.  > On further questioning she does endorse approximately 2 to 3 days of progressive shortness of breath, productive cough with white sputum, worsening wheezing and decreased effectiveness of short-term bronchodilators. > She has had secondhand smoke exposure.  She was extubated on 05/23/2019 and was subsequently transferred under [facility] care on 05/25/2019.  She was also seen by palliative care and was made DNR.  Palliative care met with her again on 05/25/2019 and based on their discussion with the patient and family, patient chose not to use even BiPAP.  Assessment & Plan:   Active Problems:   Dyspnea   COPD exacerbation (HCC)   Endotracheally intubated   Malnutrition of moderate degree   Palliative care by specialist   DNR (do not resuscitate) discussion   DNR (do not resuscitate)   Weakness generalized   Anxiety   Acute on chronic hypoxemic hypercarbic respiratory failure Acute exacerbation of her underlying chronic respiratory disease. Appears to have h/o a smoking-related interstitial lung disease, COPD, and prior diagnosis of BOOP dating back from 2006. Severe restrictive lung disease at baseline. 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  Currently fluctuating between 4 to 5 L of nasal oxygen.   We appreciate palliative care input.  Patient chose not to use BiPAP.  They continue to follow for possible hospice consideration.  She was made DNR.  Plan is to watch her in the hospital for 1-2 more days to see how she does and based on that, palliative care with medical decision whether she will go to SNF versus residential hospice.  Now that she has a stabilized so plan will be to discharge her to SNF.  Case manager is working on it. Completed course of azithromycin Continue scheduled bronchodilators.  Now that she has been stable and not declining, we will pursue SNF discharge.  Case manager on board.  Hypotension, resolved Resolved continue to observe  Hyperglycemia : Controlled.  Continue SSI.  Anxiety: Continue PRN Xanax.  Physical deconditioning: Consulted PT.  Intermittent sinus tachycardia: Controlled.  Continue Lopressor 12.5 mg twice daily.  DVT prophylaxis: Lovenox Code Status: DNR Family Communication:  None present at bedside.  Plan of care discussed with patient in length and he verbalized understanding and agreed with it. Disposition Plan: TBD  Consultants:   Palliative care  Procedures:   Intubation on 05/20/2019 and extubation on 05/23/2019  Antimicrobials:   Completed course of azithromycin   Subjective:  Patient seen and examined.  She states that she feels better.  No shortness of breath.  She keeps asking why she was passing out before.  I did answer all those questions yesterday and again today.  Objective: Vitals:   05/28/19 0632 05/28/19 0736 05/28/19  0834 05/28/19 1125  BP:   101/65   Pulse:   83   Resp:   20   Temp:   (!) 97.5 F (36.4 C)   TempSrc:   Oral   SpO2:  92% 100% 98%  Weight: 43.9 kg     Height:        Intake/Output Summary (Last 24 hours) at 05/28/2019 1323 Last data filed at 05/28/2019 1124 Gross per 24 hour  Intake -  Output 1200 ml  Net -1200 ml   Filed Weights   05/26/19 0452 05/27/19 0551 05/28/19 1031  Weight: 42.3 kg 42.5 kg 43.9 kg    Examination:  General exam: Appears calm and comfortable  Respiratory system: Coarse breath sounds with scattered rhonchi bilaterally. Respiratory effort normal. Cardiovascular system: S1 & S2 heard, RRR. No JVD, murmurs, rubs, gallops or clicks. No pedal edema. Gastrointestinal system: Abdomen is nondistended, soft and nontender. No organomegaly or masses felt. Normal bowel sounds heard. Central nervous system: Alert and oriented. No focal neurological deficits. Extremities: Symmetric 5 x 5 power. Skin: No rashes, lesions or ulcers.  Psychiatry: Judgement and insight appear poor, mood & affect appropriate.   Data Reviewed: I have personally reviewed following labs and imaging studies  CBC: Recent Labs  Lab 05/22/19 0357 05/28/19 0915  WBC 5.8 9.8  NEUTROABS  --  5.7  HGB 7.1* 9.4*  HCT 22.9* 31.2*  MCV 98.3 100.0  PLT 277 594*   Basic Metabolic Panel: Recent Labs  Lab 05/21/19 1408 05/22/19 0357 05/22/19 1632 05/23/19 0827 05/28/19 0915  NA  --  135  --  137 138  K  --  3.2*  --  4.1 4.8  CL  --  94*  --  95* 87*  CO2  --  34*  --  34* 39*  GLUCOSE  --  162*  --  170* 83  BUN  --  30*  --  25* 32*  CREATININE  --  0.56  --  0.55 0.66  CALCIUM  --  7.1*  --  8.3* 10.4*  MG 2.1 2.8* 2.2  --   --   PHOS 1.5* 1.4* 2.4*  --   --    GFR: Estimated Creatinine Clearance: 53.1 mL/min (by C-G formula based on SCr of 0.66 mg/dL). Liver Function Tests: Recent Labs  Lab 05/28/19 0915  AST 11*  ALT 21  ALKPHOS 72  BILITOT 0.2*  PROT 5.6*  ALBUMIN  2.6*   No results for input(s): LIPASE, AMYLASE in the last 168 hours. No results for input(s): AMMONIA in the last 168 hours. Coagulation Profile: No results for input(s): INR, PROTIME in the last 168 hours. Cardiac Enzymes: No results for input(s): CKTOTAL, CKMB, CKMBINDEX, TROPONINI in the last 168 hours. BNP (last 3 results) No results for input(s): PROBNP in the last 8760 hours. HbA1C: No results for input(s): HGBA1C in the last 72 hours. CBG: Recent Labs  Lab 05/27/19 2032 05/28/19 0030 05/28/19 0411 05/28/19 0830 05/28/19 1114  GLUCAP 167* 99 124* 89 154*   Lipid Profile: No results for input(s): CHOL, HDL, LDLCALC, TRIG, CHOLHDL, LDLDIRECT in the last 72 hours. Thyroid Function Tests: No results for input(s): TSH, T4TOTAL, FREET4, T3FREE, THYROIDAB in  the last 72 hours. Anemia Panel: No results for input(s): VITAMINB12, FOLATE, FERRITIN, TIBC, IRON, RETICCTPCT in the last 72 hours. Sepsis Labs: No results for input(s): PROCALCITON, LATICACIDVEN in the last 168 hours.  Recent Results (from the past 240 hour(s))  MRSA PCR Screening     Status: None   Collection Time: 05/20/19  4:29 PM   Specimen: Nasal Mucosa; Nasopharyngeal  Result Value Ref Range Status   MRSA by PCR NEGATIVE NEGATIVE Final    Comment:        The GeneXpert MRSA Assay (FDA approved for NASAL specimens only), is one component of a comprehensive MRSA colonization surveillance program. It is not intended to diagnose MRSA infection nor to guide or monitor treatment for MRSA infections. Performed at [facility], [place] [address] [place], [place] [address]       Radiology Studies: No results found.  Scheduled Meds: . budesonide (PULMICORT) nebulizer solution  0.5 mg Nebulization BID  . chlorhexidine gluconate (MEDLINE KIT)  15 mL Mouth Rinse BID  . Chlorhexidine Gluconate Cloth  6 each Topical Daily  . famotidine  20 mg Oral QHS  . feeding supplement (ENSURE ENLIVE)  237 mL Oral TID  BM  . feeding supplement (PRO-STAT SUGAR FREE 64)  30 mL Oral TID WC  . gabapentin  300 mg Oral Q8H  . insulin aspart  0-15 Units Subcutaneous Q4H  . ipratropium-albuterol  3 mL Nebulization QID  . mouth rinse  15 mL Mouth Rinse 10 times per day  . Melatonin  3 mg Per Tube QHS  . metoprolol tartrate  12.5 mg Oral BID  . venlafaxine  75 mg Oral BID WC   Continuous Infusions: . sodium chloride 10 mL/hr at 05/24/19 0600     LOS: 8 days   Time spent: 26 minutes   Darliss Cheney, MD [facility] Pager [phone]  If 7PM-7AM, please contact night-coverage [email or web address] Password [email or web address] 05/28/2019, 1:23 PM

## 2019-05-29 LAB — GLUCOSE, CAPILLARY
Glucose-Capillary: 126 mg/dL — ABNORMAL HIGH (ref 70–99)
Glucose-Capillary: 90 mg/dL (ref 70–99)
Glucose-Capillary: 184 mg/dL — ABNORMAL HIGH (ref 70–99)
Glucose-Capillary: 150 mg/dL — ABNORMAL HIGH (ref 70–99)
Glucose-Capillary: 116 mg/dL — ABNORMAL HIGH (ref 70–99)
Glucose-Capillary: 154 mg/dL — ABNORMAL HIGH (ref 70–99)

## 2019-05-29 LAB — SARS CORONAVIRUS 2 (TAT 6-24 HRS): SARS Coronavirus 2: NEGATIVE

## 2019-05-29 NOTE — Progress Notes (Signed)
PROGRESS NOTE    Krystal Cantu  RXV:[id number] DOB: 10/24/1960 DOA: 05/20/2019 PCP: Bonnita Nasuti, MD   Brief Narrative:  This is a 58 year old female patient with a complicated pulmonary history as mentioned below followed by Dr. Melvyn Novas.  Last hospitalized in August 2020 for acute on chronic respiratory failure felt  Presented to the emergency room on 05/20/2019 via EMS after being found at home unresponsive on.  Apparently had been in normal state of health the night prior.  On EMS arrival Glasgow Coma Scale was 3 with pulse oximetry at 44% this improved with saturations up to 97% on supplemental oxygen but she remained unresponsive, did not improve with BiPAP so was placed on mechanical ventilator and subsequently transferred to [facility] for further evaluation.  > On further questioning she does endorse approximately 2 to 3 days of progressive shortness of breath, productive cough with white sputum, worsening wheezing and decreased effectiveness of short-term bronchodilators. > She has had secondhand smoke exposure.  She was extubated on 05/23/2019 and was subsequently transferred under [facility] care on 05/25/2019.  She was also seen by palliative care and was made DNR.  Palliative care met with her again on 05/25/2019 and based on their discussion with the patient and family, patient chose not to use even BiPAP.  Assessment & Plan:   Active Problems:   Dyspnea   COPD exacerbation (HCC)   Endotracheally intubated   Malnutrition of moderate degree   Palliative care by specialist   DNR (do not resuscitate) discussion   DNR (do not resuscitate)   Weakness generalized   Anxiety   Acute on chronic hypoxemic hypercarbic respiratory failure Acute exacerbation of her underlying chronic respiratory disease. Appears to have h/o a smoking-related interstitial lung disease, COPD, and prior diagnosis of BOOP dating back from 2006. Severe restrictive lung disease at baseline. 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  She is down to only 3 L oxygen today.  She is much more comfortable. Patient chose not to use BiPAP.  They continue to follow for possible hospice consideration.  She was made DNR.  Now that she is a stabilized, plan is for her to be discharged to SNF.  Case management working on that.  Potential discharge tomorrow.   Hypotension, resolved Resolved continue to observe  Hyperglycemia : Controlled.  Continue SSI.  Anxiety: Continue PRN Xanax.  Physical deconditioning: Consulted PT.  Intermittent sinus tachycardia: Controlled.  Continue Lopressor 12.5 mg twice daily.  DVT prophylaxis: Lovenox Code Status: DNR Family Communication:  None present at bedside.  Plan of care discussed with patient in length and he verbalized understanding and agreed with it. Disposition Plan: TBD  Consultants:   Palliative care  Procedures:   Intubation on 05/20/2019 and extubation on 05/23/2019  Antimicrobials:   Completed course of azithromycin   Subjective: Patient seen and examined.  She has no complaints.  Denies any shortness of breath.  Once again she asked me why she was having episodes of passing out before she came in.  She has been asking that question for last 4 days.  Objective: Vitals:   05/29/19 0537 05/29/19 0724 05/29/19 0804 05/29/19 1113  BP:   100/61  Pulse:   86   Resp:   16   Temp:   98.2 F (36.8 C)   TempSrc:   Oral   SpO2:  98% 99% 97%  Weight: 42.3 kg     Height:        Intake/Output Summary (Last 24 hours) at 05/29/2019 1328 Last data filed at 05/29/2019 0600 Gross per 24 hour  Intake -  Output 1300 ml  Net -1300 ml    Filed Weights   05/27/19 0551 05/28/19 0632 05/29/19 0537  Weight: 42.5 kg 43.9 kg 42.3 kg    Examination:  General exam: Appears calm and comfortable  Respiratory system: Coarse breath sounds with rhonchi bilaterally. Respiratory effort normal. Cardiovascular system: S1 & S2 heard, RRR. No JVD, murmurs, rubs, gallops or clicks. No pedal edema. Gastrointestinal system: Abdomen is nondistended, soft and nontender. No organomegaly or masses felt. Normal bowel sounds heard. Central nervous system: Alert and oriented. No focal neurological deficits. Extremities: Symmetric 5 x 5 power. Skin: No rashes, lesions or ulcers.  Psychiatry: Judgement and insight appear poor. mood & affect appropriate.   Data Reviewed: I have personally reviewed following labs and imaging studies  CBC: Recent Labs  Lab 05/28/19 0915  WBC 9.8  NEUTROABS 5.7  HGB 9.4*  HCT 31.2*  MCV 100.0  PLT 037*   Basic Metabolic Panel: Recent Labs  Lab 05/22/19 1632 05/23/19 0827 05/28/19 0915  NA  --  137 138  K  --  4.1 4.8  CL  --  95* 87*  CO2  --  34* 39*  GLUCOSE  --  170* 83  BUN  --  25* 32*  CREATININE  --  0.55 0.66  CALCIUM  --  8.3* 10.4*  MG 2.2  --   --   PHOS 2.4*  --   --    GFR: Estimated Creatinine Clearance: 51.2 mL/min (by C-G formula based on SCr of 0.66 mg/dL). Liver Function Tests: Recent Labs  Lab 05/28/19 0915  AST 11*  ALT 21  ALKPHOS 72  BILITOT 0.2*  PROT 5.6*  ALBUMIN 2.6*   No results for input(s): LIPASE, AMYLASE in the last 168 hours. No results for input(s): AMMONIA in the last 168 hours. Coagulation Profile: No results for input(s): INR, PROTIME in the last 168 hours. Cardiac Enzymes: No results for input(s): CKTOTAL, CKMB, CKMBINDEX, TROPONINI in the last 168 hours. BNP (last 3 results) No results for input(s): PROBNP in the last 8760 hours. HbA1C: No results for input(s): HGBA1C in the last 72 hours. CBG: Recent Labs  Lab 05/28/19 1913 05/28/19 2257  05/29/19 0245 05/29/19 0805 05/29/19 1138  GLUCAP 155* 118* 126* 90 184*   Lipid Profile: No results for input(s): CHOL, HDL, LDLCALC, TRIG, CHOLHDL, LDLDIRECT in the last 72 hours. Thyroid Function Tests: No results for input(s): TSH, T4TOTAL, FREET4, T3FREE, THYROIDAB in the last 72 hours. Anemia Panel: No results for input(s): VITAMINB12, FOLATE, FERRITIN, TIBC, IRON, RETICCTPCT in the last 72 hours. Sepsis Labs: No results for input(s): PROCALCITON, LATICACIDVEN in the last 168 hours.  Recent Results (from the past 240 hour(s))  MRSA PCR Screening     Status: None   Collection Time: 05/20/19  4:29 PM   Specimen: Nasal Mucosa; Nasopharyngeal  Result Value Ref Range Status   MRSA by PCR NEGATIVE NEGATIVE Final    Comment:        The GeneXpert MRSA Assay (FDA approved for NASAL specimens only), is one component of a comprehensive MRSA colonization  surveillance program. It is not intended to diagnose MRSA infection nor to guide or monitor treatment for MRSA infections. Performed at [facility], [place] [address], [place], [place] [address]       Radiology Studies: No results found.  Scheduled Meds: . budesonide (PULMICORT) nebulizer solution  0.5 mg Nebulization BID  . chlorhexidine gluconate (MEDLINE KIT)  15 mL Mouth Rinse BID  . Chlorhexidine Gluconate Cloth  6 each Topical Daily  . famotidine  20 mg Oral QHS  . feeding supplement (ENSURE ENLIVE)  237 mL Oral TID BM  . feeding supplement (PRO-STAT SUGAR FREE 64)  30 mL Oral TID WC  . gabapentin  300 mg Oral Q8H  . insulin aspart  0-15 Units Subcutaneous Q4H  . ipratropium-albuterol  3 mL Nebulization QID  . mouth rinse  15 mL Mouth Rinse 10 times per day  . Melatonin  3 mg Per Tube QHS  . metoprolol tartrate  12.5 mg Oral BID  . venlafaxine  75 mg Oral BID WC   Continuous Infusions: . sodium chloride 10 mL/hr at 05/24/19 0600     LOS: 9 days   Time spent: 25 minutes   Darliss Cheney, MD [facility] Pager [phone]  If 7PM-7AM, please contact night-coverage [email or web address] Password [email or web address] 05/29/2019, 1:28 PM

## 2019-05-29 NOTE — TOC Progression Note (Signed)
Transition of Care [facility]) - Progression Note    Patient Details  Name: Krystal Cantu MRN: [id number] Date of Birth: [birth year]  Transition of Care [facility]) CM/SW [place], [place] Phone Number: 05/29/2019, 1:50 PM  Clinical Narrative:   CSW sent a message this morning to [facility] to see if they can review referral for SNF. CSW sent another message after no response received. [facility] called CSW to ask about patient's long term plan, because she was just recently at [facility] in [place] and they had recommended ALF for her and they are worried about continued readmissions if the patient isn't going to pursue additional care. CSW discussed with [facility] representative about palliative discussions, how the family is cautiously hopeful that the patient will be able to improve, but if she does not then they are wanting to pursue hospice placement for end of life care. [facility] unsure if [facility] will provide SNF authorization or not, but they will attempt. Patient will be in copay days, as she has not had a 60 day wellness period since her August admission.     Expected Discharge Plan: [place] Barriers to Discharge: Ship broker, Other (comment)(COVID test)  Expected Discharge Plan and Services Expected Discharge Plan: [place] In-house Referral: Clinical Social Work, Hospice / Palliative Care Discharge Planning Services: CM Consult Post Acute Care Choice: [place] arrangements for the past 2 months: Single Family Home                                       Social Determinants of Health (SDOH) Interventions    Readmission Risk Interventions No flowsheet data found.

## 2019-05-29 NOTE — Progress Notes (Signed)
Physical Therapy Treatment Patient Details Name: Krystal Cantu MRN: [id number] DOB: 06-30-1961 Today's Date: 05/29/2019    History of Present Illness Pt adm with acute on chronic respiratory failure requiring intubation  9/26. One way extubation 9/29. PMH - copd, boop vs ILD, anxiety, depression    PT Comments    Pt making steady progress. Able to fully stand today with the use of the stedy and 2 person assist. Continue to recommend SNF.   Follow Up Recommendations  SNF     Equipment Recommendations  None recommended by PT    Recommendations for Other Services       Precautions / Restrictions Precautions Precautions: Fall;Other (comment) Precaution Comments: watch SpO2    Mobility  Bed Mobility Overal bed mobility: Needs Assistance Bed Mobility: Supine to Sit;Sit to Sidelying     Supine to sit: Max assist   Sit to sidelying: Mod assist General bed mobility comments: Assist to bring legs off of bed, elevate trunk into sitting, and bring hips to EOB. Assist to bring legs back up into bed to return to sidelying  Transfers Overall transfer level: Needs assistance Equipment used: Ambulation equipment used Transfers: Sit to/from Stand Sit to Stand: +2 physical assistance;Mod assist         General transfer comment: Assist to bring hips up using bed pad and for balance. Pt performed 3 times. Used stedy for bed to bsc to bed transfers  Ambulation/Gait                 Stairs             Wheelchair Mobility    Modified Rankin (Stroke Patients Only)       Balance Overall balance assessment: Needs assistance Sitting-balance support: Bilateral upper extremity supported;Feet supported Sitting balance-Leahy Scale: Poor Sitting balance - Comments: UE support                                    Cognition Arousal/Alertness: Awake/alert Behavior During Therapy: WFL for tasks assessed/performed Overall Cognitive Status: Within Functional  Limits for tasks assessed                                        Exercises General Exercises - Lower Extremity Long Arc Quad: Strengthening;Both;10 reps;Seated Hip Flexion/Marching: Strengthening;Both;10 reps;Seated    General Comments        Pertinent Vitals/Pain      Home Living                      Prior Function            PT Goals (current goals can now be found in the care plan section) Acute Rehab PT Goals Patient Stated Goal: not stated Progress towards PT goals: Progressing toward goals    Frequency    Min 2X/week      PT Plan Current plan remains appropriate    Co-evaluation              AM-PAC PT "6 Clicks" Mobility   Outcome Measure  Help needed turning from your back to your side while in a flat bed without using bedrails?: A Lot Help needed moving from lying on your back to sitting on the side of a flat bed without using bedrails?: Total Help needed moving to and from  a bed to a chair (including a wheelchair)?: Total Help needed standing up from a chair using your arms (e.g., wheelchair or bedside chair)?: Total Help needed to walk in hospital room?: Total Help needed climbing 3-5 steps with a railing? : Total 6 Click Score: 7    End of Session Equipment Utilized During Treatment: Oxygen Activity Tolerance: Patient limited by fatigue Patient left: with call bell/phone within reach;in bed;with bed alarm set Nurse Communication: Mobility status PT Visit Diagnosis: Unsteadiness on feet (R26.81);Other abnormalities of gait and mobility (R26.89);Muscle weakness (generalized) (M62.81)     Time: 1430-1500 PT Time Calculation (min) (ACUTE ONLY): 30 min  Charges:  $Therapeutic Activity: 23-37 mins                     [place] Pager [phone] Office [place] 05/29/2019, 5:22 PM

## 2019-05-30 LAB — GLUCOSE, CAPILLARY
Glucose-Capillary: 100 mg/dL — ABNORMAL HIGH (ref 70–99)
Glucose-Capillary: 108 mg/dL — ABNORMAL HIGH (ref 70–99)
Glucose-Capillary: 110 mg/dL — ABNORMAL HIGH (ref 70–99)
Glucose-Capillary: 129 mg/dL — ABNORMAL HIGH (ref 70–99)
Glucose-Capillary: 138 mg/dL — ABNORMAL HIGH (ref 70–99)
Glucose-Capillary: 162 mg/dL — ABNORMAL HIGH (ref 70–99)

## 2019-05-30 MED ORDER — CLONAZEPAM 2 MG PO TABS: 1.0000 mg | ORAL_TABLET | Freq: Two times a day (BID) | ORAL | 0 refills | Status: AC

## 2019-05-30 MED ORDER — ACETAMINOPHEN 325 MG PO TABS
650.0000 mg | ORAL_TABLET | Freq: Four times a day (QID) | ORAL | Status: DC | PRN
  Administered 2019-05-30 – 2019-06-08 (×6): 650 mg via ORAL
  Filled 2019-05-30 (×6): qty 2

## 2019-05-30 NOTE — Progress Notes (Signed)
PROGRESS NOTE    Krystal Cantu  CHY:[id number] DOB: 1961/01/10 DOA: 05/20/2019 PCP: Bonnita Nasuti, MD   Brief Narrative:  This is a 58 year old female patient with a complicated pulmonary history as mentioned below followed by Dr. Melvyn Novas.  Last hospitalized in August 2020 for acute on chronic respiratory failure felt  Presented to the emergency room on 05/20/2019 via EMS after being found at home unresponsive on.  Apparently had been in normal state of health the night prior.  On EMS arrival Glasgow Coma Scale was 3 with pulse oximetry at 44% this improved with saturations up to 97% on supplemental oxygen but she remained unresponsive, did not improve with BiPAP so was placed on mechanical ventilator and subsequently transferred to [facility] for further evaluation.  > On further questioning she does endorse approximately 2 to 3 days of progressive shortness of breath, productive cough with white sputum, worsening wheezing and decreased effectiveness of short-term bronchodilators. > She has had secondhand smoke exposure.  She was extubated on 05/23/2019 and was subsequently transferred under [facility] care on 05/25/2019.  She was also seen by palliative care and was made DNR.  Palliative care met with her again on 05/25/2019 and based on their discussion with the patient and family, patient chose not to use even BiPAP.  Assessment & Plan:   Active Problems:   Dyspnea   COPD exacerbation (HCC)   Endotracheally intubated   Malnutrition of moderate degree   Palliative care by specialist   DNR (do not resuscitate) discussion   DNR (do not resuscitate)   Weakness generalized   Anxiety   Acute on chronic hypoxemic hypercarbic respiratory failure Acute exacerbation of her underlying chronic respiratory disease. Appears to have h/o a smoking-related interstitial lung disease, COPD, and prior diagnosis of BOOP dating back from 2006. Severe restrictive lung disease at baseline. 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  She is down to only 3 L oxygen today.  She is much more comfortable. Patient chose not to use BiPAP.  PMT continues to see her.  She was made DNR.  Now that she is a stabilized, plan is for her to be discharged to SNF.  Case management working on that.  I was informed that insurance authorization has been initiated yesterday which can return anytime.  Hypotension, resolved Resolved continue to observe  Hyperglycemia : Controlled.  Continue SSI.  Anxiety: Continue PRN Xanax.  Physical deconditioning: Consulted PT.  Intermittent sinus tachycardia: Controlled.  Continue Lopressor 12.5 mg twice daily.  DVT prophylaxis: Lovenox Code Status: DNR Family Communication:  None present at bedside.  Plan of care discussed with patient in length and he verbalized understanding and agreed with it. Disposition Plan: I was informed by case manager that her insurance authorization was initiated yesterday on 05/29/2019.  Per the recommendations patient's cover test was ordered in preparation for her discharge to SNF today however I waited all day and insurance authorization has not been approved yet.  Hopefully she will be discharged tomorrow.  Consultants:   Palliative care  Procedures:   Intubation on 05/20/2019 and extubation on 05/23/2019  Antimicrobials:   Completed course of azithromycin   Subjective: Patient seen and examined.  Sitting in the bed and eating breakfast.  No complaints.  Denied any shortness of breath.  On 3 L oxygen.  Objective: Vitals:   05/30/19 0741 05/30/19 0742 05/30/19 1059 05/30/19 1533  BP: (!) 85/51 (!) 86/52    Pulse: 78 78     Resp: 17     Temp: 98.5 F (36.9 C)     TempSrc: Oral     SpO2: 100% 100% 94% 95%  Weight:      Height:        Intake/Output Summary (Last 24 hours) at 05/30/2019 1645 Last data filed at 05/30/2019 1300 Gross per 24 hour  Intake 240 ml  Output 1300 ml  Net -1060 ml   Filed Weights   05/28/19 0632 05/29/19 0537 05/30/19 0255  Weight: 43.9 kg 42.3 kg 44.1 kg    Examination:  General exam: Appears calm and comfortable  Respiratory system: Coarse breath sounds with rhonchi bilaterally. Respiratory effort normal. Cardiovascular system: S1 & S2 heard, RRR. No JVD, murmurs, rubs, gallops or clicks. No pedal edema. Gastrointestinal system: Abdomen is nondistended, soft and nontender. No organomegaly or masses felt. Normal bowel sounds heard. Central nervous system: Alert and oriented. No focal neurological deficits. Extremities: Symmetric 5 x 5 power. Skin: No rashes, lesions or ulcers.  Psychiatry: Judgement and insight appear poor. Mood & affect appropriate.   Data Reviewed: I have personally reviewed following labs and imaging studies  CBC: Recent Labs  Lab 05/28/19 0915  WBC 9.8  NEUTROABS 5.7  HGB 9.4*  HCT 31.2*  MCV 100.0  PLT 657*   Basic Metabolic Panel: Recent Labs  Lab 05/28/19 0915  NA 138  K 4.8  CL 87*  CO2 39*  GLUCOSE 83  BUN 32*  CREATININE 0.66  CALCIUM 10.4*   GFR: Estimated Creatinine Clearance: 53.4 mL/min (by C-G formula based on SCr of 0.66 mg/dL). Liver Function Tests: Recent Labs  Lab 05/28/19 0915  AST 11*  ALT 21  ALKPHOS 72  BILITOT 0.2*  PROT 5.6*  ALBUMIN 2.6*   No results for input(s): LIPASE, AMYLASE in the last 168 hours. No results for input(s): AMMONIA in the last 168 hours. Coagulation Profile: No results for input(s): INR, PROTIME in the last 168 hours. Cardiac Enzymes: No results for input(s): CKTOTAL, CKMB, CKMBINDEX, TROPONINI in the last 168 hours. BNP (last 3 results) No results for input(s): PROBNP in the  last 8760 hours. HbA1C: No results for input(s): HGBA1C in the last 72 hours. CBG: Recent Labs  Lab 05/29/19 1909 05/29/19 2324 05/30/19 0254 05/30/19 0739 05/30/19 1246  GLUCAP 116* 154* 100* 138* 108*   Lipid Profile: No results for input(s): CHOL, HDL, LDLCALC, TRIG, CHOLHDL, LDLDIRECT in the last 72 hours. Thyroid Function Tests: No results for input(s): TSH, T4TOTAL, FREET4, T3FREE, THYROIDAB in the last 72 hours. Anemia Panel: No results for input(s): VITAMINB12, FOLATE, FERRITIN, TIBC, IRON, RETICCTPCT in the last 72 hours. Sepsis Labs: No results for input(s): PROCALCITON, LATICACIDVEN in the last 168 hours.  Recent Results (from the past 240 hour(s))  SARS CORONAVIRUS 2 (TAT 6-24 HRS) Nasopharyngeal Nasopharyngeal Swab     Status: None   Collection Time: 05/29/19  2:03 PM   Specimen: Nasopharyngeal Swab  Result Value Ref Range Status   SARS Coronavirus 2 NEGATIVE NEGATIVE Final    Comment: (NOTE) SARS-CoV-2 target nucleic acids are NOT DETECTED. The SARS-CoV-2 RNA is generally detectable in upper and lower respiratory specimens during the acute phase of infection. Negative results do not preclude  SARS-CoV-2 infection, do not rule out co-infections with other pathogens, and should not be used as the sole basis for treatment or other patient management decisions. Negative results must be combined with clinical observations, patient history, and epidemiological information. The expected result is Negative. Fact Sheet for Patients: SugarRoll.be Fact Sheet for Healthcare Providers: https://www.woods-mathews.com/ This test is not yet approved or cleared by the [place] FDA and  has been authorized for detection and/or diagnosis of SARS-CoV-2 by FDA under an Emergency Use Authorization (EUA). This EUA will remain  in effect (meaning this test can be used) for the duration of the COVID-19 declaration under Section 56 4(b)(1)  of the Act, 21 U.S.C. section 360bbb-3(b)(1), unless the authorization is terminated or revoked sooner. Performed at [facility], [place] [address] [place], [place] [address]       Radiology Studies: No results found.  Scheduled Meds: . budesonide (PULMICORT) nebulizer solution  0.5 mg Nebulization BID  . chlorhexidine gluconate (MEDLINE KIT)  15 mL Mouth Rinse BID  . Chlorhexidine Gluconate Cloth  6 each Topical Daily  . famotidine  20 mg Oral QHS  . feeding supplement (ENSURE ENLIVE)  237 mL Oral TID BM  . feeding supplement (PRO-STAT SUGAR FREE 64)  30 mL Oral TID WC  . gabapentin  300 mg Oral Q8H  . insulin aspart  0-15 Units Subcutaneous Q4H  . ipratropium-albuterol  3 mL Nebulization QID  . mouth rinse  15 mL Mouth Rinse 10 times per day  . Melatonin  3 mg Per Tube QHS  . metoprolol tartrate  12.5 mg Oral BID  . venlafaxine  75 mg Oral BID WC   Continuous Infusions: . sodium chloride 10 mL/hr at 05/24/19 0600     LOS: 10 days   Time spent: 26 minutes   Darliss Cheney, MD [facility] Pager [phone]  If 7PM-7AM, please contact night-coverage [email or web address] Password [email or web address] 05/30/2019, 4:45 PM

## 2019-05-31 DIAGNOSIS — R0600 Dyspnea, unspecified: Secondary | ICD-10-CM

## 2019-05-31 LAB — GLUCOSE, CAPILLARY
Glucose-Capillary: 102 mg/dL — ABNORMAL HIGH (ref 70–99)
Glucose-Capillary: 103 mg/dL — ABNORMAL HIGH (ref 70–99)
Glucose-Capillary: 103 mg/dL — ABNORMAL HIGH (ref 70–99)
Glucose-Capillary: 112 mg/dL — ABNORMAL HIGH (ref 70–99)
Glucose-Capillary: 112 mg/dL — ABNORMAL HIGH (ref 70–99)
Glucose-Capillary: 166 mg/dL — ABNORMAL HIGH (ref 70–99)

## 2019-05-31 MED ORDER — MORPHINE SULFATE (CONCENTRATE) 10 MG/0.5ML PO SOLN: 5.0000 mg | ORAL | 0 refills | Status: AC | PRN

## 2019-05-31 MED ORDER — ORAL CARE MOUTH RINSE
15.0000 mL | Freq: Two times a day (BID) | OROMUCOSAL | Status: DC
  Administered 2019-06-01 – 2019-06-08 (×13): 15 mL via OROMUCOSAL

## 2019-05-31 MED ORDER — METOPROLOL TARTRATE 25 MG PO TABS: 12.5000 mg | ORAL_TABLET | Freq: Two times a day (BID) | ORAL | Status: AC

## 2019-05-31 NOTE — Discharge Summary (Addendum)
Physician Discharge Summary  Krystal Cantu [id number] DOB: Apr 24, 1961 DOA: 05/20/2019  PCP: Bonnita Nasuti, MD  Admit date: 05/20/2019 Discharge date:06/05/19  Admitted From: Home Disposition:  Home  Discharge Condition:Stable CODE STATUS:DNR Diet recommendation: Heart Healthy   Brief/Interim Summary:  Patient iis a 58 year old female patient with a complicated pulmonary history as mentioned below followed by Dr. Melvyn Novas. Last hospitalized in August 2020 for acute on chronic respiratory failure.Presented to the emergency room on 05/20/2019 via EMS after being found at home unresponsive on. Apparently had been in normal state of health the night prior. She remained unresponsive, did not improve with BiPAP so was placed on mechanical ventilator and subsequently transferred to [facility] for further evaluation.   She was extubated on 05/23/2019 and was subsequently transferred under [facility] care on 05/25/2019.  She was also seen by palliative care and was made DNR.  Currently her respiratory status is back to baseline.  PCCM has signed off.  She was seen by PT/OT and recommended skilled facility on discharge. She is hemodynamically stable for discharge to SNF today.  Following problems were addressed during her hospitalization:  Acute on chronic hypoxemic hypercarbic respiratory failure: Acute exacerbation of her underlying chronic respiratory disease. Appears to have h/o a smoking-related interstitial lung disease, COPD, and prior diagnosis of BOOP dating back from 2006. Severe restrictive lung disease at baseline. Likely has secondary pulmonary hypertension to underlying lung disease, on sildenafil . Patient stable post extubation. She is down to only 3 L oxygen .  She is much more comfortable.  Continue bronchodilators and supplemented oxygen  Hypotension: Currently BP stable  Anxiety: Continue PRN clonazepam.Also on venlafaxine  Physical deconditioning: Consulted PT.Recommended  SNF  Intermittent sinus tachycardia: Controlled.  Continue Lopressor 12.5 mg twice daily.   Discharge Diagnoses:  Active Problems:   Dyspnea   COPD exacerbation ([place])   Endotracheally intubated   Malnutrition of moderate degree   Palliative care by specialist   DNR (do not resuscitate) discussion   DNR (do not resuscitate)   Weakness generalized   Anxiety    Discharge Instructions  Discharge Instructions    Diet - low sodium heart healthy   Complete by: As directed    Discharge instructions   Complete by: As directed    1) Take prescribed medications as instructed 2)Do CBC and BMP tests in a week. 3)Follow up with your pulmonologist in 2 weeks   Increase activity slowly   Complete by: As directed      Allergies as of 06/02/2019      Reactions   Ceftriaxone Anaphylaxis   Pregabalin Other (See Comments)   Pt. Experienced joint pain, depression, confusion   Codeine Nausea Only   Itraconazole Diarrhea, Nausea Only   Sulfamethoxazole Diarrhea, Nausea Only      Medication List    STOP taking these medications   Breo Ellipta 100-25 MCG/INH Aepb Generic drug: fluticasone furoate-vilanterol     TAKE these medications   acetaminophen 325 MG tablet Commonly known as: TYLENOL Take 2 tablets (650 mg total) by mouth every 4 (four) hours as needed for mild pain (temp > 101.5).   atorvastatin 20 MG tablet Commonly known as: LIPITOR Take 20 mg by mouth at bedtime.   CALCIUM 600 PO Take 600 mg by mouth daily.   clonazePAM 2 MG tablet Commonly known as: KLONOPIN Take 0.5-1 tablets (1-2 mg total) by mouth 2 (two) times daily. Take 1mg  in am and 2mg  in pm What changed: Another medication with the same  name was removed. Continue taking this medication, and follow the directions you see here.   esomeprazole 40 MG capsule Commonly known as: NEXIUM Take 40 mg by mouth daily.   feeding supplement Liqd Take 1 Container by mouth 2 (two) times daily between meals.    fluticasone 50 MCG/ACT nasal spray Commonly known as: FLONASE Place 1 spray into both nostrils daily.   Fluticasone-Umeclidin-Vilant 100-62.5-25 MCG/INH Aepb Commonly known as: Trelegy Ellipta Inhale 1 puff into the lungs daily.   furosemide 20 MG tablet Commonly known as: LASIX Take 1 tablet (20 mg total) by mouth daily.   gabapentin 300 MG capsule Commonly known as: NEURONTIN Take 300 mg by mouth 3 (three) times daily.   ipratropium-albuterol 0.5-2.5 (3) MG/3ML Soln Commonly known as: DUONEB Take 3 mLs by nebulization 4 (four) times daily.   Melatonin 5 MG Tabs Take 5 mg by mouth at bedtime.   metoprolol tartrate 25 MG tablet Commonly known as: LOPRESSOR Take 0.5 tablets (12.5 mg total) by mouth 2 (two) times daily.   morphine CONCENTRATE 10 MG/0.5ML Soln concentrated solution Take 0.25 mLs (5 mg total) by mouth every 3 (three) hours as needed for shortness of breath.   ondansetron 4 MG tablet Commonly known as: ZOFRAN Take 4-8 mg by mouth every 8 (eight) hours as needed for nausea or vomiting.   OXYGEN Place 2-3 L/min into the nose continuous.   potassium chloride SA 20 MEQ tablet Commonly known as: KLOR-CON Take 20 mEq by mouth 2 (two) times daily.   ProAir HFA 108 (90 Base) MCG/ACT inhaler Generic drug: albuterol Inhale 2 puffs into the lungs every 6 (six) hours as needed for wheezing or shortness of breath.   sildenafil 20 MG tablet Commonly known as: REVATIO Take 20 mg by mouth 3 (three) times daily.   venlafaxine 75 MG tablet Commonly known as: EFFEXOR Take 75 mg by mouth daily.   Vitamin D-3 25 MCG (1000 UT) Caps Take 1,000 Units by mouth daily.      Follow-up Information    Hague, Rosalyn Charters, MD. Schedule an appointment as soon as possible for a visit in 1 week(s).   Specialty: Internal Medicine Contact information: [address] [place] [address] [phone]          Allergies  Allergen Reactions  . Ceftriaxone Anaphylaxis   . Pregabalin Other (See Comments)    Pt. Experienced joint pain, depression, confusion   . Codeine Nausea Only  . Itraconazole Diarrhea and Nausea Only  . Sulfamethoxazole Diarrhea and Nausea Only    Consultations:  PCCM   Procedures/Studies: Dg Chest 1 View  Result Date: 05/20/2019 CLINICAL DATA:  ETT placement EXAM: CHEST  1 VIEW COMPARISON:  April 30, 2019 FINDINGS: The ETT is in good position. The NG tube terminates below today's film. No pneumothorax. Bilateral pulmonary infiltrates are mildly improved in the interval. No other interval changes. IMPRESSION: ETT is in good position. Persistent but improving bilateral pulmonary infiltrates. Electronically Signed   By: Dorise Bullion III M.D   On: 05/20/2019 17:57   Ct Chest High Resolution  Result Date: 05/22/2019 CLINICAL DATA:  Interstitial lung disease with acute exacerbation. History of organizing pneumonia. EXAM: CT CHEST WITHOUT CONTRAST TECHNIQUE: Multidetector CT imaging of the chest was performed following the standard protocol without intravenous contrast. High resolution imaging of the lungs, as well as inspiratory and expiratory imaging, was performed. COMPARISON:  05/20/2019, 04/29/2019, 03/22/2019, 01/11/2018 and 11/26/2016. FINDINGS: Cardiovascular: Atherosclerotic calcification of the aorta and coronary arteries.  Pulmonic trunk and heart are enlarged. There is decreased attenuation of the intravascular compartment which is indicative of anemia. No pericardial effusion. Mediastinum/Nodes: Mediastinal lymph nodes are not enlarged by CT size criteria. Hilar regions are difficult to evaluate without IV contrast. No axillary adenopathy. A nasogastric tube is seen in the esophagus. Lungs/Pleura: Image quality is somewhat degraded by respiratory motion. Postoperative changes in the right upper lobe. There are confluent areas of parenchymal consolidation/retraction with associated traction bronchiectasis/bronchiolectasis, with a  slight upper/midlung zone predominance. Associated interstitial coarsening and subpleural reticulation. No definitive honeycombing. Findings may be slightly progressive from 01/11/2018, especially in the lower lobes. Calcified granuloma in the left lower lobe. No air trapping. No pleural fluid. Endotracheal tube terminates 3 cm above the carina. Upper Abdomen: Visualized portions of the liver, adrenal glands, spleen and stomach are unremarkable with exception of a nasogastric tube partially imaged in the stomach. Musculoskeletal: No worrisome lytic or sclerotic lesions. IMPRESSION: 1. Pulmonary parenchymal pattern of fibrosis appears mildly progressive from 01/11/2018 and would be consistent with a reported history of sarcoid. Chronic hypersensitivity pneumonitis is not excluded. Findings are suggestive of an alternative diagnosis (not UIP) per consensus guidelines: Diagnosis of Idiopathic Pulmonary Fibrosis: An Official ATS/ERS/JRS/ALAT Clinical Practice Guideline. [place], Iss 5, [phone], Apr 24 2017. 2. Aortic atherosclerosis (ICD10-170.0). Coronary artery calcification. 3. Enlarged pulmonic trunk, indicative of pulmonary arterial hypertension. Electronically Signed   By: Lorin Picket M.D.   On: 05/22/2019 08:40   Dg Chest Port 1 View  Result Date: 05/21/2019 CLINICAL DATA:  Endotracheal tube. EXAM: PORTABLE CHEST 1 VIEW COMPARISON:  Radiograph of May 20, 2019. FINDINGS: Stable cardiomegaly. Endotracheal and nasogastric tubes are unchanged in position. No pneumothorax is noted. Stable bilateral opacities are noted concerning for pneumonia. Small pleural effusions may be present. Bony thorax is unremarkable. IMPRESSION: Stable support apparatus.  Stable bilateral lung opacities. Electronically Signed   By: Marijo Conception M.D.   On: 05/21/2019 08:44      Subjective:  Patient seen and examined at bedside this morning.  Hospital status stable.  Denies any complaints.   Hemodynamically stable for discharge.  Discharge Exam: Vitals:   06/02/19 1014 06/02/19 1043  BP: (!) 93/57   Pulse:    Resp:    Temp:    SpO2:  95%   Vitals:   06/01/19 2333 06/02/19 0817 06/02/19 1014 06/02/19 1043  BP: 110/62 (!) 80/54 (!) 93/57   Pulse: 78 76    Resp: 18     Temp: 98.2 F (36.8 C) 98.6 F (37 C)    TempSrc: Oral Oral    SpO2: 95% 100%  95%  Weight:      Height:        General: Pt is alert, awake, not in acute distress Cardiovascular: RRR, S1/S2 +, no rubs, no gallops Respiratory: CTA bilaterally, no wheezing, no rhonchi Abdominal: Soft, NT, ND, bowel sounds + Extremities: no edema, no cyanosis    The results of significant diagnostics from this hospitalization (including imaging, microbiology, ancillary and laboratory) are listed below for reference.     Microbiology: Recent Results (from the past 240 hour(s))  SARS CORONAVIRUS 2 (TAT 6-24 HRS) Nasopharyngeal Nasopharyngeal Swab     Status: None   Collection Time: 05/29/19  2:03 PM   Specimen: Nasopharyngeal Swab  Result Value Ref Range Status   SARS Coronavirus 2 NEGATIVE NEGATIVE Final    Comment: (NOTE) SARS-CoV-2 target nucleic acids are NOT DETECTED. The SARS-CoV-2  RNA is generally detectable in upper and lower respiratory specimens during the acute phase of infection. Negative results do not preclude SARS-CoV-2 infection, do not rule out co-infections with other pathogens, and should not be used as the sole basis for treatment or other patient management decisions. Negative results must be combined with clinical observations, patient history, and epidemiological information. The expected result is Negative. Fact Sheet for Patients: SugarRoll.be Fact Sheet for Healthcare Providers: https://www.woods-mathews.com/ This test is not yet approved or cleared by the [place] FDA and  has been authorized for detection and/or diagnosis of  SARS-CoV-2 by FDA under an Emergency Use Authorization (EUA). This EUA will remain  in effect (meaning this test can be used) for the duration of the COVID-19 declaration under Section 56 4(b)(1) of the Act, 21 U.S.C. section 360bbb-3(b)(1), unless the authorization is terminated or revoked sooner. Performed at [facility], [place] [address], [place], [place] [address]      Labs: BNP (last 3 results) Recent Labs    03/23/19 1747  BNP [id number]*   Basic Metabolic Panel: Recent Labs  Lab 05/28/19 0915  NA 138  K 4.8  CL 87*  CO2 39*  GLUCOSE 83  BUN 32*  CREATININE 0.66  CALCIUM 10.4*   Liver Function Tests: Recent Labs  Lab 05/28/19 0915  AST 11*  ALT 21  ALKPHOS 72  BILITOT 0.2*  PROT 5.6*  ALBUMIN 2.6*   No results for input(s): LIPASE, AMYLASE in the last 168 hours. No results for input(s): AMMONIA in the last 168 hours. CBC: Recent Labs  Lab 05/28/19 0915  WBC 9.8  NEUTROABS 5.7  HGB 9.4*  HCT 31.2*  MCV 100.0  PLT 455*   Cardiac Enzymes: No results for input(s): CKTOTAL, CKMB, CKMBINDEX, TROPONINI in the last 168 hours. BNP: Invalid input(s): POCBNP CBG: Recent Labs  Lab 06/01/19 1659 06/01/19 1944 06/01/19 2330 06/02/19 0347 06/02/19 0816  GLUCAP 101* 159* 133* 102* 96   D-Dimer No results for input(s): DDIMER in the last 72 hours. Hgb A1c No results for input(s): HGBA1C in the last 72 hours. Lipid Profile No results for input(s): CHOL, HDL, LDLCALC, TRIG, CHOLHDL, LDLDIRECT in the last 72 hours. Thyroid function studies No results for input(s): TSH, T4TOTAL, T3FREE, THYROIDAB in the last 72 hours.  Invalid input(s): FREET3 Anemia work up No results for input(s): VITAMINB12, FOLATE, FERRITIN, TIBC, IRON, RETICCTPCT in the last 72 hours. Urinalysis    Component Value Date/Time   COLORURINE YELLOW 03/23/2019 1810   APPEARANCEUR CLOUDY (A) 03/23/2019 1810   LABSPEC 1.017 03/23/2019 1810   PHURINE 5.0 03/23/2019 1810   GLUCOSEU  NEGATIVE 03/23/2019 1810   HGBUR NEGATIVE 03/23/2019 1810   BILIRUBINUR NEGATIVE 03/23/2019 1810   KETONESUR NEGATIVE 03/23/2019 1810   PROTEINUR 30 (A) 03/23/2019 1810   NITRITE NEGATIVE 03/23/2019 1810   LEUKOCYTESUR NEGATIVE 03/23/2019 1810   Sepsis Labs Invalid input(s): PROCALCITONIN,  WBC,  LACTICIDVEN Microbiology Recent Results (from the past 240 hour(s))  SARS CORONAVIRUS 2 (TAT 6-24 HRS) Nasopharyngeal Nasopharyngeal Swab     Status: None   Collection Time: 05/29/19  2:03 PM   Specimen: Nasopharyngeal Swab  Result Value Ref Range Status   SARS Coronavirus 2 NEGATIVE NEGATIVE Final    Comment: (NOTE) SARS-CoV-2 target nucleic acids are NOT DETECTED. The SARS-CoV-2 RNA is generally detectable in upper and lower respiratory specimens during the acute phase of infection. Negative results do not preclude SARS-CoV-2 infection, do not rule out co-infections with other pathogens, and  should not be used as the sole basis for treatment or other patient management decisions. Negative results must be combined with clinical observations, patient history, and epidemiological information. The expected result is Negative. Fact Sheet for Patients: SugarRoll.be Fact Sheet for Healthcare Providers: https://www.woods-mathews.com/ This test is not yet approved or cleared by the [place] FDA and  has been authorized for detection and/or diagnosis of SARS-CoV-2 by FDA under an Emergency Use Authorization (EUA). This EUA will remain  in effect (meaning this test can be used) for the duration of the COVID-19 declaration under Section 56 4(b)(1) of the Act, 21 U.S.C. section 360bbb-3(b)(1), unless the authorization is terminated or revoked sooner. Performed at [facility], [place] [address] [place], [place] [address]     Please note: You were cared for by a hospitalist during your hospital stay. Once you are discharged, your primary care  physician will handle any further medical issues. Please note that NO REFILLS for any discharge medications will be authorized once you are discharged, as it is imperative that you return to your primary care physician (or establish a relationship with a primary care physician if you do not have one) for your post hospital discharge needs so that they can reassess your need for medications and monitor your lab values.    Time coordinating discharge: 40 minutes  SIGNED:   Shelly Coss, MD  [facility] 06/02/2019, 11:43 AM Pager [phone]  If 7PM-7AM, please contact night-coverage [email or web address] Password [email or web address]

## 2019-05-31 NOTE — TOC Progression Note (Signed)
Transition of Care [facility]) - Progression Note    Patient Details  Name: Krystal Cantu MRN: [id number] Date of Birth: 1961/01/24  Transition of Care [facility] CM/SW [place], [place] Phone Number: 05/31/2019, 4:17 PM  Clinical Narrative:   Patient continues to await insurance authorization for SNF. CSW to follow.    Expected Discharge Plan: Skilled Nursing Facility Barriers to Discharge: Ship broker, Other (comment)(COVID test)  Expected Discharge Plan and Services Expected Discharge Plan: [place] In-house Referral: Clinical Social Work, Hospice / Palliative Care Discharge Planning Services: CM Consult Post Acute Care Choice: [place] Living arrangements for the past 2 months: Single Family Home Expected Discharge Date: 05/31/19                                     Social Determinants of Health (SDOH) Interventions    Readmission Risk Interventions No flowsheet data found.

## 2019-05-31 NOTE — Progress Notes (Signed)
Occupational Therapy Treatment Patient Details Name: Krystal Cantu MRN: [id number] DOB: 02/24/1961 Today's Date: 05/31/2019    History of present illness Pt adm with acute on chronic respiratory failure requiring intubation  9/26. One way extubation 9/29. PMH - copd, boop vs ILD, anxiety, depression   OT comments  Pt progressing really well with therapy today. Pt continues to have poor activity tolerance as pt able to perform own toilet hygiene, ambulate to bathroom/sink and sit for light grooming at EOB. Pt reports "I feel exhausted after that." Pt's O2 >85% on 4L and requires 5L O2 to stay  >90% with activity. Pt ambulating in room with RW and fair balance with standing tasks. Pt requires continues OT skilled services for ADL, mobility and safety in SNF setting. OT following acutely.    Follow Up Recommendations  SNF;Supervision/Assistance - 24 hour    Equipment Recommendations  3 in 1 bedside commode    Recommendations for Other Services      Precautions / Restrictions Precautions Precautions: Fall;Other (comment) Precaution Comments: watch SpO2 Restrictions Weight Bearing Restrictions: No       Mobility Bed Mobility Overal bed mobility: Needs Assistance Bed Mobility: Supine to Sit;Sit to Sidelying     Supine to sit: Supervision   Sit to sidelying: Supervision General bed mobility comments: SupervisionA for BLE management  Transfers Overall transfer level: Needs assistance Equipment used: Rolling walker (2 wheeled) Transfers: Sit to/from Stand Sit to Stand: Min assist         General transfer comment: assist for power up    Balance Overall balance assessment: Needs assistance Sitting-balance support: Bilateral upper extremity supported;Feet supported Sitting balance-Leahy Scale: Poor Sitting balance - Comments: UE support   Standing balance support: Single extremity supported Standing balance-Leahy Scale: Poor Standing balance comment: needs external  support from RW and therapist.                            ADL either performed or assessed with clinical judgement   ADL Overall ADL's : Needs assistance/impaired     Grooming: Minimal assistance;Standing;Wash/dry hands;Wash/dry face;Sitting       Lower Body Bathing: Minimal assistance;Moderate assistance;Cueing for safety;Cueing for sequencing;Sitting/lateral leans;Sit to/from stand       Lower Body Dressing: Minimal assistance;Moderate assistance;Cueing for safety;Cueing for sequencing;Sitting/lateral leans   Toilet Transfer: Minimal assistance;Ambulation;Regular Toilet;Grab bars;RW   Toileting- Clothing Manipulation and Hygiene: Minimal assistance;Cueing for safety;Cueing for sequencing;Sitting/lateral lean;Sit to/from stand       Functional mobility during ADLs: Min guard;Minimal assistance;Rolling walker;Cueing for safety;Cueing for sequencing General ADL Comments: Pt able to perform own toilet hygiene, ambulate to bathroom/sink and sit for light grooming at EOB. Pt reports "I feel exhausted after that,"     Vision   Vision Assessment?: No apparent visual deficits   Perception     Praxis      Cognition Arousal/Alertness: Awake/alert Behavior During Therapy: WFL for tasks assessed/performed Overall Cognitive Status: Within Functional Limits for tasks assessed                                          Exercises     Shoulder Instructions       General Comments      Pertinent Vitals/ Pain       Pain Assessment: No/denies pain Pain Score: 0-No pain  Home Living  Prior Functioning/Environment              Frequency  Min 2X/week        Progress Toward Goals  OT Goals(current goals can now be found in the care plan section)  Progress towards OT goals: Progressing toward goals  Acute Rehab OT Goals Patient Stated Goal: not stated OT Goal Formulation: With  patient Time For Goal Achievement: 06/08/19 Potential to Achieve Goals: Good ADL Goals Pt Will Perform Grooming: with supervision;standing Pt Will Perform Upper Body Dressing: with supervision;standing Pt Will Perform Lower Body Dressing: with set-up;sit to/from stand Pt Will Transfer to Toilet: with set-up;ambulating;regular height toilet Pt Will Perform Toileting - Clothing Manipulation and hygiene: with min assist;sitting/lateral leans;sit to/from stand Pt/caregiver will Perform Home Exercise Program: Increased strength;Both right and left upper extremity;With written HEP provided Additional ADL Goal #1: Pt will perform OOB ADL with modified independence level assist with 1-2 seated rest breaks  Plan Discharge plan remains appropriate    Co-evaluation                 AM-PAC OT "6 Clicks" Daily Activity     Outcome Measure   Help from another person eating meals?: None Help from another person taking care of personal grooming?: A Little Help from another person toileting, which includes using toliet, bedpan, or urinal?: A Little Help from another person bathing (including washing, rinsing, drying)?: A Little Help from another person to put on and taking off regular upper body clothing?: A Little Help from another person to put on and taking off regular lower body clothing?: A Little 6 Click Score: 19    End of Session Equipment Utilized During Treatment: Gait belt;Rolling walker;Oxygen  OT Visit Diagnosis: Unsteadiness on feet (R26.81);Muscle weakness (generalized) (M62.81);Pain Pain - Right/Left: Right   Activity Tolerance Treatment limited secondary to medical complications (Comment);Patient limited by lethargy   Patient Left in bed;with call bell/phone within reach;with bed alarm set   Nurse Communication Mobility status        Time: 1501-1530 OT Time Calculation (min): 29 min  Charges: OT General Charges $OT Visit: 1 Visit OT Treatments $Self Care/Home  Management : 23-37 mins  Ebony Hail Harold Hedge) Marsa Aris OTR/L Acute Rehabilitation Services Pager: [phone] Office: [place] 05/31/2019, 3:58 PM

## 2019-06-01 LAB — GLUCOSE, CAPILLARY
Glucose-Capillary: 101 mg/dL — ABNORMAL HIGH (ref 70–99)
Glucose-Capillary: 133 mg/dL — ABNORMAL HIGH (ref 70–99)
Glucose-Capillary: 137 mg/dL — ABNORMAL HIGH (ref 70–99)
Glucose-Capillary: 152 mg/dL — ABNORMAL HIGH (ref 70–99)
Glucose-Capillary: 159 mg/dL — ABNORMAL HIGH (ref 70–99)
Glucose-Capillary: 85 mg/dL (ref 70–99)

## 2019-06-01 MED ORDER — GABAPENTIN 300 MG PO CAPS
300.0000 mg | ORAL_CAPSULE | Freq: Three times a day (TID) | ORAL | Status: DC
  Administered 2019-06-01 – 2019-06-08 (×20): 300 mg via ORAL
  Filled 2019-06-01 (×20): qty 1

## 2019-06-01 MED ORDER — SILDENAFIL CITRATE 20 MG PO TABS
20.0000 mg | ORAL_TABLET | Freq: Three times a day (TID) | ORAL | Status: DC
  Administered 2019-06-01 – 2019-06-08 (×19): 20 mg via ORAL
  Filled 2019-06-01 (×25): qty 1

## 2019-06-01 MED ORDER — IPRATROPIUM-ALBUTEROL 0.5-2.5 (3) MG/3ML IN SOLN
3.0000 mL | Freq: Two times a day (BID) | RESPIRATORY_TRACT | Status: DC
  Administered 2019-06-01 – 2019-06-08 (×14): 3 mL via RESPIRATORY_TRACT
  Filled 2019-06-01 (×16): qty 3

## 2019-06-01 NOTE — Progress Notes (Signed)
Nutrition Follow-up  DOCUMENTATION CODES:   Underweight, Non-severe (moderate) malnutrition in context of chronic illness  INTERVENTION:   -Ensure Enlive poTID, each supplement provides 350 kcal and 20 grams of protein  -30 ml Prostat TID, each supplement provides 100 kcals , 15g protein  NUTRITION DIAGNOSIS:   Moderate Malnutrition related to chronic illness(chronic lung disease (COPD, restrictive lung disease), CHF) as evidenced by moderate fat depletion, severe muscle depletion.  Ongoing.  GOAL:   Patient will meet greater than or equal to 90% of their needs  Progressing.  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 Pt is drinking Ensure and Prostat supplements. Patient awaiting SNF placement.  Admission weight: 106 lbs. Current weight: 97 lbs.  Medications reviewed. Labs reviewed: CBGs: 85-152  Diet Order:   Diet Order            Diet - low sodium heart healthy        Diet regular Room service appropriate? Yes; Fluid consistency: Thin  Diet effective now              EDUCATION NEEDS:   Not appropriate for education at this time  Skin:  Skin Assessment: Reviewed RN Assessment  Last BM:  10/7  Height:   Ht Readings from Last 1 Encounters:  05/22/19 5\' 4"  (1.626 m)    Weight:   Wt Readings from Last 1 Encounters:  05/31/19 44.3 kg    Ideal Body Weight:  54.5 kg  BMI:  Body mass index is 16.76 kg/m.  Estimated Nutritional Needs:   Kcal:  1470-1680 kcals  Protein:  68-91 g  Fluid:  >/= 1.3 L  Clayton Bibles, MS, RD, LDN Inpatient  Clinical Dietitian Pager: [phone] After Hours Pager: [phone]

## 2019-06-01 NOTE — Progress Notes (Signed)
Physical Therapy Treatment Patient Details Name: Krystal Cantu MRN: [id number] DOB: Nov 12, 1960 Today's Date: 06/01/2019    History of Present Illness Pt adm with acute on chronic respiratory failure requiring intubation  9/26. One way extubation 9/29. PMH - copd, boop vs ILD, anxiety, depression    PT Comments    Pt progressing well with mobility. This session focused on standing tolerance and LE exercises. Desat to 81% on 3L during activity. SpO2 87% on 3L at rest at end of session. Pt in recliner with feet elevated at end of session with lunch tray in place.    Follow Up Recommendations  SNF     Equipment Recommendations  None recommended by PT    Recommendations for Other Services       Precautions / Restrictions Precautions Precautions: Fall;Other (comment) Precaution Comments: watch SpO2    Mobility  Bed Mobility               General bed mobility comments: Pt received in recliner.  Transfers Overall transfer level: Needs assistance Equipment used: Rolling walker (2 wheeled) Transfers: Sit to/from Stand Sit to Stand: Min assist         General transfer comment: assist to power up and stabilize balance  Ambulation/Gait                 Stairs             Wheelchair Mobility    Modified Rankin (Stroke Patients Only)       Balance Overall balance assessment: Needs assistance Sitting-balance support: Bilateral upper extremity supported;Feet supported Sitting balance-Leahy Scale: Fair     Standing balance support: Bilateral upper extremity supported Standing balance-Leahy Scale: Poor Standing balance comment: reliant on UE support. Static stand with RW x 4 minutes min assist.                            Cognition Arousal/Alertness: Awake/alert Behavior During Therapy: WFL for tasks assessed/performed Overall Cognitive Status: Within Functional Limits for tasks assessed                                         Exercises General Exercises - Lower Extremity Ankle Circles/Pumps: AROM;Both;20 reps Long Arc Quad: AROM;Right;Left;10 reps Hip ABduction/ADduction: AROM;Both;10 reps Hip Flexion/Marching: AROM;Right;Left;5 reps;Standing    General Comments General comments (skin integrity, edema, etc.): SpO2 81-87% on 3L during session.      Pertinent Vitals/Pain Pain Assessment: No/denies pain    Home Living                      Prior Function            PT Goals (current goals can now be found in the care plan section) Acute Rehab PT Goals Patient Stated Goal: get stronger PT Goal Formulation: With patient Time For Goal Achievement: 06/08/19 Potential to Achieve Goals: Fair Progress towards PT goals: Progressing toward goals    Frequency    Min 2X/week      PT Plan Current plan remains appropriate    Co-evaluation              AM-PAC PT "6 Clicks" Mobility   Outcome Measure  Help needed turning from your back to your side while in a flat bed without using bedrails?: A Lot Help needed moving from lying on your  back to sitting on the side of a flat bed without using bedrails?: A Lot Help needed moving to and from a bed to a chair (including a wheelchair)?: A Lot Help needed standing up from a chair using your arms (e.g., wheelchair or bedside chair)?: A Little Help needed to walk in hospital room?: A Lot Help needed climbing 3-5 steps with a railing? : Total 6 Click Score: 12    End of Session Equipment Utilized During Treatment: Oxygen;Gait belt Activity Tolerance: Patient limited by fatigue Patient left: in chair;with call bell/phone within reach;with bed alarm set Nurse Communication: Mobility status PT Visit Diagnosis: Unsteadiness on feet (R26.81);Other abnormalities of gait and mobility (R26.89);Muscle weakness (generalized) (M62.81)     Time: 1250-1316 PT Time Calculation (min) (ACUTE ONLY): 26 min  Charges:  $Therapeutic Exercise: 8-22  mins $Therapeutic Activity: 8-22 mins                     Lorrin Goodell, PT  Office # [phone] Pager [phone]    Lorriane Shire 06/01/2019, 1:42 PM

## 2019-06-01 NOTE — Progress Notes (Signed)
PROGRESS NOTE    Krystal Cantu  EXN:[id number] DOB: 1961/08/20 DOA: 05/20/2019 PCP: Bonnita Nasuti, MD   Brief Narrative:  Patient iis a 58 year old female patient with a complicated pulmonary history as mentioned below followed by Dr. Melvyn Novas. Last hospitalized in August 2020 for acute on chronic respiratory failure felt Presented to the emergency room on 05/20/2019 via EMS after being found at home unresponsive on. Apparently had been in normal state of health the night prior. She remained unresponsive, did not improve with BiPAP so was placed on mechanical ventilator and subsequently transferred to [facility] for further evaluation.   She was extubated on 05/23/2019 and was subsequently transferred under [facility] care on 05/25/2019. She was also seen by palliative care and was made DNR.  Currently her respiratory status is back to baseline.  PCCM has signed off.  She was seen by PT/OT and recommended skilled facility on discharge. She is hemodynamically stable for discharge to SNF today.  Assessment & Plan:   Active Problems:   Dyspnea   COPD exacerbation (HCC)   Endotracheally intubated   Malnutrition of moderate degree   Palliative care by specialist   DNR (do not resuscitate) discussion   DNR (do not resuscitate)   Weakness generalized   Anxiety   Acute on chronic hypoxemic hypercarbic respiratory failure: Acute exacerbation of her underlying chronic respiratory disease. Appears to have h/o a smoking-related interstitial lung disease, COPD, and prior diagnosis of BOOP dating back from 2006. Severe restrictive lung disease at baseline. Likely has secondary pulmonary hypertension to underlying lung disease, on sildenafil . Patient stable post extubation. She is down to only 3 L oxygen . She is much more comfortable.  Continue bronchodilators and supplemented oxygen  Hypotension: Currently BP stable  Anxiety:Continue PRN clonazepam.Also on venlafaxine  Physical  deconditioning:Consulted PT.Recommended SNF  Intermittent sinus tachycardia:Controlled. Continue Lopressor 12.5 mg twice daily.   Nutrition Problem: Moderate Malnutrition Etiology: chronic illness(chronic lung disease (COPD, restrictive lung disease), CHF)      DVT prophylaxis:Lovenox Code Status: Full Family Communication: None Disposition Plan: SNF as soon as possible   Consultants: PCCM  Procedures:Intubation  Antimicrobials:  Anti-infectives (From admission, onward)   Start     Dose/Rate Route Frequency Ordered Stop   05/20/19 1500  azithromycin (ZITHROMAX) 500 mg in sodium chloride 0.9 % 250 mL IVPB     500 mg 250 mL/hr over 60 Minutes Intravenous Every 24 hours 05/20/19 1450 05/22/19 1718      Subjective:  Patient seen and examined at bedside this morning.  Hemodynamically stable.  Waiting for transfer to skilled nursing facility.  Objective: Vitals:   06/01/19 0935 06/01/19 0936 06/01/19 1025 06/01/19 1038  BP:   (!) 93/59 103/64  Pulse:   84 91  Resp:      Temp:      TempSrc:      SpO2: 99% 99% 98%   Weight:      Height:        Intake/Output Summary (Last 24 hours) at 06/01/2019 1142 Last data filed at 05/31/2019 2350 Gross per 24 hour  Intake 580 ml  Output 1150 ml  Net -570 ml   Filed Weights   05/29/19 0537 05/30/19 0255 05/31/19 0422  Weight: 42.3 kg 44.1 kg 44.3 kg    Examination:  General exam: Debilitated/deconditioned, appears older than age HEENT:PERRL,Oral mucosa moist, Ear/Nose normal on gross exam Respiratory system: Bilateral equal air entry, normal vesicular breath sounds, no wheezes or crackles  Cardiovascular system: S1 & S2  heard, RRR. No JVD, murmurs, rubs, gallops or clicks. No pedal edema. Gastrointestinal system: Abdomen is nondistended, soft and nontender. No organomegaly or masses felt. Normal bowel sounds heard. Central nervous system: Alert and oriented. No focal neurological deficits. Extremities: No edema, no  clubbing ,no cyanosis, distal peripheral pulses palpable. Skin: No rashes, lesions or ulcers,no icterus ,no pallor     Data Reviewed: I have personally reviewed following labs and imaging studies  CBC: Recent Labs  Lab 05/28/19 0915  WBC 9.8  NEUTROABS 5.7  HGB 9.4*  HCT 31.2*  MCV 100.0  PLT 607*   Basic Metabolic Panel: Recent Labs  Lab 05/28/19 0915  NA 138  K 4.8  CL 87*  CO2 39*  GLUCOSE 83  BUN 32*  CREATININE 0.66  CALCIUM 10.4*   GFR: Estimated Creatinine Clearance: 53.6 mL/min (by C-G formula based on SCr of 0.66 mg/dL). Liver Function Tests: Recent Labs  Lab 05/28/19 0915  AST 11*  ALT 21  ALKPHOS 72  BILITOT 0.2*  PROT 5.6*  ALBUMIN 2.6*   No results for input(s): LIPASE, AMYLASE in the last 168 hours. No results for input(s): AMMONIA in the last 168 hours. Coagulation Profile: No results for input(s): INR, PROTIME in the last 168 hours. Cardiac Enzymes: No results for input(s): CKTOTAL, CKMB, CKMBINDEX, TROPONINI in the last 168 hours. BNP (last 3 results) No results for input(s): PROBNP in the last 8760 hours. HbA1C: No results for input(s): HGBA1C in the last 72 hours. CBG: Recent Labs  Lab 05/31/19 1646 05/31/19 1956 05/31/19 2346 06/01/19 0403 06/01/19 0712  GLUCAP 112* 166* 102* 137* 85   Lipid Profile: No results for input(s): CHOL, HDL, LDLCALC, TRIG, CHOLHDL, LDLDIRECT in the last 72 hours. Thyroid Function Tests: No results for input(s): TSH, T4TOTAL, FREET4, T3FREE, THYROIDAB in the last 72 hours. Anemia Panel: No results for input(s): VITAMINB12, FOLATE, FERRITIN, TIBC, IRON, RETICCTPCT in the last 72 hours. Sepsis Labs: No results for input(s): PROCALCITON, LATICACIDVEN in the last 168 hours.  Recent Results (from the past 240 hour(s))  SARS CORONAVIRUS 2 (TAT 6-24 HRS) Nasopharyngeal Nasopharyngeal Swab     Status: None   Collection Time: 05/29/19  2:03 PM   Specimen: Nasopharyngeal Swab  Result Value Ref Range  Status   SARS Coronavirus 2 NEGATIVE NEGATIVE Final    Comment: (NOTE) SARS-CoV-2 target nucleic acids are NOT DETECTED. The SARS-CoV-2 RNA is generally detectable in upper and lower respiratory specimens during the acute phase of infection. Negative results do not preclude SARS-CoV-2 infection, do not rule out co-infections with other pathogens, and should not be used as the sole basis for treatment or other patient management decisions. Negative results must be combined with clinical observations, patient history, and epidemiological information. The expected result is Negative. Fact Sheet for Patients: SugarRoll.be Fact Sheet for Healthcare Providers: https://www.woods-mathews.com/ This test is not yet approved or cleared by the [place] FDA and  has been authorized for detection and/or diagnosis of SARS-CoV-2 by FDA under an Emergency Use Authorization (EUA). This EUA will remain  in effect (meaning this test can be used) for the duration of the COVID-19 declaration under Section 56 4(b)(1) of the Act, 21 U.S.C. section 360bbb-3(b)(1), unless the authorization is terminated or revoked sooner. Performed at [facility], [place] [address] [place], [place] [address]          Radiology Studies: No results found.      Scheduled Meds: . budesonide (PULMICORT) nebulizer solution  0.5 mg Nebulization  BID  . chlorhexidine gluconate (MEDLINE KIT)  15 mL Mouth Rinse BID  . famotidine  20 mg Oral QHS  . feeding supplement (ENSURE ENLIVE)  237 mL Oral TID BM  . feeding supplement (PRO-STAT SUGAR FREE 64)  30 mL Oral TID WC  . gabapentin  300 mg Oral Q8H  . insulin aspart  0-15 Units Subcutaneous Q4H  . ipratropium-albuterol  3 mL Nebulization BID  . mouth rinse  15 mL Mouth Rinse BID  . Melatonin  3 mg Per Tube QHS  . metoprolol tartrate  12.5 mg Oral BID  . venlafaxine  75 mg Oral BID WC   Continuous Infusions: . sodium  chloride 10 mL/hr at 05/24/19 0600     LOS: 12 days    Time spent: 15 mins.More than 50% of that time was spent in counseling and/or coordination of care.      Shelly Coss, MD [facility] Pager [phone]  If 7PM-7AM, please contact night-coverage [email or web address] Password [email or web address] 06/01/2019, 11:42 AM

## 2019-06-02 LAB — GLUCOSE, CAPILLARY
Glucose-Capillary: 102 mg/dL — ABNORMAL HIGH (ref 70–99)
Glucose-Capillary: 109 mg/dL — ABNORMAL HIGH (ref 70–99)
Glucose-Capillary: 113 mg/dL — ABNORMAL HIGH (ref 70–99)
Glucose-Capillary: 143 mg/dL — ABNORMAL HIGH (ref 70–99)
Glucose-Capillary: 155 mg/dL — ABNORMAL HIGH (ref 70–99)
Glucose-Capillary: 96 mg/dL (ref 70–99)

## 2019-06-02 NOTE — Progress Notes (Signed)
Patient IV got tangled in covers and accidentally pulled out.Patient requesting that IV not be restarted tonight since plan is to discharge in morning.Patient in agreement to replace IV if IV medications are needed.

## 2019-06-02 NOTE — Progress Notes (Signed)
PROGRESS NOTE    Krystal Cantu  KCM:[id number] DOB: May 07, 1961 DOA: 05/20/2019 PCP: Bonnita Nasuti, MD   Brief Narrative:  Patient iis a 58 year old female patient with a complicated pulmonary history as mentioned below followed by Dr. Melvyn Novas. Last hospitalized in August 2020 for acute on chronic respiratory failure felt Presented to the emergency room on 05/20/2019 via EMS after being found at home unresponsive on. Apparently had been in normal state of health the night prior. She remained unresponsive, did not improve with BiPAP so was placed on mechanical ventilator and subsequently transferred to [facility] for further evaluation.   She was extubated on 05/23/2019 and was subsequently transferred under [facility] care on 05/25/2019. She was also seen by palliative care and was made DNR.  Currently her respiratory status is back to baseline.  PCCM has signed off.  She was seen by PT/OT and recommended skilled facility on discharge. She is hemodynamically stable for discharge to SNF today.  Assessment & Plan:   Active Problems:   Dyspnea   COPD exacerbation (HCC)   Endotracheally intubated   Malnutrition of moderate degree   Palliative care by specialist   DNR (do not resuscitate) discussion   DNR (do not resuscitate)   Weakness generalized   Anxiety   Acute on chronic hypoxemic hypercarbic respiratory failure: Acute exacerbation of her underlying chronic respiratory disease. Appears to have h/o a smoking-related interstitial lung disease, COPD, and prior diagnosis of BOOP dating back from 2006. Severe restrictive lung disease at baseline. Likely has secondary pulmonary hypertension to underlying lung disease, on sildenafil . Patient stable post extubation. She is down to only 3 L oxygen . She is much more comfortable.  Continue bronchodilators and supplemented oxygen  Hypotension: Currently BP stable  Anxiety:Continue PRN clonazepam.Also on venlafaxine  Physical  deconditioning:Consulted PT.Recommended SNF  Intermittent sinus tachycardia:Controlled. Continue Lopressor 12.5 mg twice daily.   Nutrition Problem: Moderate Malnutrition Etiology: chronic illness(chronic lung disease (COPD, restrictive lung disease), CHF)      DVT prophylaxis:Lovenox Code Status: Full Family Communication: None Disposition Plan: SNF as soon as possible   Consultants: PCCM  Procedures:Intubation  Antimicrobials:  Anti-infectives (From admission, onward)   Start     Dose/Rate Route Frequency Ordered Stop   05/20/19 1500  azithromycin (ZITHROMAX) 500 mg in sodium chloride 0.9 % 250 mL IVPB     500 mg 250 mL/hr over 60 Minutes Intravenous Every 24 hours 05/20/19 1450 05/22/19 1718      Subjective:  Patient seen and examined the bedside this morning.  Hemodynamically stable.  Waiting for skilled nursing facilty bed. Objective: Vitals:   06/01/19 2333 06/02/19 0817 06/02/19 1014 06/02/19 1043  BP: 110/62 (!) 80/54 (!) 93/57   Pulse: 78 76    Resp: 18     Temp: 98.2 F (36.8 C) 98.6 F (37 C)    TempSrc: Oral Oral    SpO2: 95% 100%  95%  Weight:      Height:        Intake/Output Summary (Last 24 hours) at 06/02/2019 1139 Last data filed at 06/02/2019 0600 Gross per 24 hour  Intake -  Output 1150 ml  Net -1150 ml   Filed Weights   05/29/19 0537 05/30/19 0255 05/31/19 0422  Weight: 42.3 kg 44.1 kg 44.3 kg    Examination:  General exam: Debilitated/deconditioned, appears older than age HEENT:PERRL,Oral mucosa moist, Ear/Nose normal on gross exam Respiratory system: Bilateral equal air entry, normal vesicular breath sounds, no wheezes or crackles  Cardiovascular  system: S1 & S2 heard, RRR. No JVD, murmurs, rubs, gallops or clicks. No pedal edema. Gastrointestinal system: Abdomen is nondistended, soft and nontender. No organomegaly or masses felt. Normal bowel sounds heard. Central nervous system: Alert and oriented. No focal neurological  deficits. Extremities: No edema, no clubbing ,no cyanosis, distal peripheral pulses palpable. Skin: No rashes, lesions or ulcers,no icterus ,no pallor     Data Reviewed: I have personally reviewed following labs and imaging studies  CBC: Recent Labs  Lab 05/28/19 0915  WBC 9.8  NEUTROABS 5.7  HGB 9.4*  HCT 31.2*  MCV 100.0  PLT 537*   Basic Metabolic Panel: Recent Labs  Lab 05/28/19 0915  NA 138  K 4.8  CL 87*  CO2 39*  GLUCOSE 83  BUN 32*  CREATININE 0.66  CALCIUM 10.4*   GFR: Estimated Creatinine Clearance: 53.6 mL/min (by C-G formula based on SCr of 0.66 mg/dL). Liver Function Tests: Recent Labs  Lab 05/28/19 0915  AST 11*  ALT 21  ALKPHOS 72  BILITOT 0.2*  PROT 5.6*  ALBUMIN 2.6*   No results for input(s): LIPASE, AMYLASE in the last 168 hours. No results for input(s): AMMONIA in the last 168 hours. Coagulation Profile: No results for input(s): INR, PROTIME in the last 168 hours. Cardiac Enzymes: No results for input(s): CKTOTAL, CKMB, CKMBINDEX, TROPONINI in the last 168 hours. BNP (last 3 results) No results for input(s): PROBNP in the last 8760 hours. HbA1C: No results for input(s): HGBA1C in the last 72 hours. CBG: Recent Labs  Lab 06/01/19 1659 06/01/19 1944 06/01/19 2330 06/02/19 0347 06/02/19 0816  GLUCAP 101* 159* 133* 102* 96   Lipid Profile: No results for input(s): CHOL, HDL, LDLCALC, TRIG, CHOLHDL, LDLDIRECT in the last 72 hours. Thyroid Function Tests: No results for input(s): TSH, T4TOTAL, FREET4, T3FREE, THYROIDAB in the last 72 hours. Anemia Panel: No results for input(s): VITAMINB12, FOLATE, FERRITIN, TIBC, IRON, RETICCTPCT in the last 72 hours. Sepsis Labs: No results for input(s): PROCALCITON, LATICACIDVEN in the last 168 hours.  Recent Results (from the past 240 hour(s))  SARS CORONAVIRUS 2 (TAT 6-24 HRS) Nasopharyngeal Nasopharyngeal Swab     Status: None   Collection Time: 05/29/19  2:03 PM   Specimen:  Nasopharyngeal Swab  Result Value Ref Range Status   SARS Coronavirus 2 NEGATIVE NEGATIVE Final    Comment: (NOTE) SARS-CoV-2 target nucleic acids are NOT DETECTED. The SARS-CoV-2 RNA is generally detectable in upper and lower respiratory specimens during the acute phase of infection. Negative results do not preclude SARS-CoV-2 infection, do not rule out co-infections with other pathogens, and should not be used as the sole basis for treatment or other patient management decisions. Negative results must be combined with clinical observations, patient history, and epidemiological information. The expected result is Negative. Fact Sheet for Patients: SugarRoll.be Fact Sheet for Healthcare Providers: https://www.woods-mathews.com/ This test is not yet approved or cleared by the [place] FDA and  has been authorized for detection and/or diagnosis of SARS-CoV-2 by FDA under an Emergency Use Authorization (EUA). This EUA will remain  in effect (meaning this test can be used) for the duration of the COVID-19 declaration under Section 56 4(b)(1) of the Act, 21 U.S.C. section 360bbb-3(b)(1), unless the authorization is terminated or revoked sooner. Performed at [facility], [place] [address], [place], [place] [address]          Radiology Studies: No results found.      Scheduled Meds: . budesonide (PULMICORT) nebulizer solution  0.5 mg Nebulization BID  . chlorhexidine gluconate (MEDLINE KIT)  15 mL Mouth Rinse BID  . famotidine  20 mg Oral QHS  . feeding supplement (ENSURE ENLIVE)  237 mL Oral TID BM  . feeding supplement (PRO-STAT SUGAR FREE 64)  30 mL Oral TID WC  . gabapentin  300 mg Oral Q8H  . insulin aspart  0-15 Units Subcutaneous Q4H  . ipratropium-albuterol  3 mL Nebulization BID  . mouth rinse  15 mL Mouth Rinse BID  . Melatonin  3 mg Per Tube QHS  . metoprolol tartrate  12.5 mg Oral BID  . sildenafil  20 mg Oral  TID  . venlafaxine  75 mg Oral BID WC   Continuous Infusions: . sodium chloride 10 mL/hr at 05/24/19 0600     LOS: 13 days    Time spent: 15 mins.More than 50% of that time was spent in counseling and/or coordination of care.      Shelly Coss, MD [facility] Pager [phone]  If 7PM-7AM, please contact night-coverage [email or web address] Password [email or web address] 06/02/2019, 11:39 AM

## 2019-06-03 LAB — GLUCOSE, CAPILLARY
Glucose-Capillary: 127 mg/dL — ABNORMAL HIGH (ref 70–99)
Glucose-Capillary: 104 mg/dL — ABNORMAL HIGH (ref 70–99)
Glucose-Capillary: 60 mg/dL — ABNORMAL LOW (ref 70–99)
Glucose-Capillary: 154 mg/dL — ABNORMAL HIGH (ref 70–99)
Glucose-Capillary: 140 mg/dL — ABNORMAL HIGH (ref 70–99)
Glucose-Capillary: 137 mg/dL — ABNORMAL HIGH (ref 70–99)

## 2019-06-03 NOTE — TOC Progression Note (Signed)
Transition of Care [facility]) - Progression Note    Patient Details  Name: Chantay Ellwood MRN: [id number] Date of Birth: 14-Aug-1961  Transition of Care [facility]) CM/SW [place], [place] Phone Number: 06/03/2019, 8:22 AM  Clinical Narrative:   CSW following for discharge plan. Patient continues to await insurance authorization to SNF. CSW checked in with [facility] multiple times throughout the day today to check on auth status. [facility] Admissions contacted [facility] to ask about status, and it is still in process; no decision made at this point. CSW alerted CSW Team Lead about barrier to discharge being insurance authorization, have been awaiting authorization since the request was started on Monday afternoon. Patient cannot discharge without a decision from [facility], CSW to continue to follow. Patient will also need an updated COVID test when authorization is received as request has taken multiple days and SNF needs a COVID within 48 hours of admission.    Expected Discharge Plan: Skilled Nursing Facility Barriers to Discharge: Ship broker, Other (comment)(COVID test)  Expected Discharge Plan and Services Expected Discharge Plan: [place] In-house Referral: Clinical Social Work, Hospice / Palliative Care Discharge Planning Services: CM Consult Post Acute Care Choice: [place] Living arrangements for the past 2 months: Single Family Home Expected Discharge Date: 05/31/19                                     Social Determinants of Health (SDOH) Interventions    Readmission Risk Interventions No flowsheet data found.

## 2019-06-03 NOTE — Progress Notes (Signed)
PROGRESS NOTE    Krystal Cantu  IWO:[id number] DOB: 11-10-1960 DOA: 05/20/2019 PCP: Bonnita Nasuti, MD   Brief Narrative:  Patient iis a 58 year old female patient with a complicated pulmonary history as mentioned below followed by Dr. Melvyn Novas. Last hospitalized in August 2020 for acute on chronic respiratory failure felt Presented to the emergency room on 05/20/2019 via EMS after being found at home unresponsive on. Apparently had been in normal state of health the night prior. She remained unresponsive, did not improve with BiPAP so was placed on mechanical ventilator and subsequently transferred to [facility] for further evaluation.   She was extubated on 05/23/2019 and was subsequently transferred under [facility] care on 05/25/2019. She was also seen by palliative care and was made DNR.  Currently her respiratory status is back to baseline.  PCCM has signed off.  She was seen by PT/OT and recommended skilled facility on discharge. She is hemodynamically stable for discharge to SNF today.  Assessment & Plan:   Active Problems:   Dyspnea   COPD exacerbation (HCC)   Endotracheally intubated   Malnutrition of moderate degree   Palliative care by specialist   DNR (do not resuscitate) discussion   DNR (do not resuscitate)   Weakness generalized   Anxiety   Acute on chronic hypoxemic hypercarbic respiratory failure: Acute exacerbation of her underlying chronic respiratory disease. Appears to have h/o a smoking-related interstitial lung disease, COPD, and prior diagnosis of BOOP dating back from 2006. Severe restrictive lung disease at baseline. Likely has secondary pulmonary hypertension to underlying lung disease, on sildenafil . Patient stable post extubation. She is down to only 3 L oxygen . She is much more comfortable.  Continue bronchodilators and supplemented oxygen  Hypotension: Currently BP stable  Anxiety:Continue PRN clonazepam.Also on venlafaxine  Physical  deconditioning:Consulted PT.Recommended SNF  Intermittent sinus tachycardia:Controlled. Continue Lopressor 12.5 mg twice daily.   Nutrition Problem: Moderate Malnutrition Etiology: chronic illness(chronic lung disease (COPD, restrictive lung disease), CHF)      DVT prophylaxis:Lovenox Code Status: Full Family Communication: Called brother Tim Ralph for update Disposition Plan: SNF as soon as possible   Consultants: PCCM  Procedures:Intubation  Antimicrobials:  Anti-infectives (From admission, onward)   Start     Dose/Rate Route Frequency Ordered Stop   05/20/19 1500  azithromycin (ZITHROMAX) 500 mg in sodium chloride 0.9 % 250 mL IVPB     500 mg 250 mL/hr over 60 Minutes Intravenous Every 24 hours 05/20/19 1450 05/22/19 1718      Subjective:  Patient seen and examined the bedside this morning.  Hemodynamically stable.  Comfortable.  No active issues.  Waiting for skilled  nursing facility bed.  Objective: Vitals:   06/03/19 0736 06/03/19 0811 06/03/19 0823 06/03/19 1133  BP: (!) 89/50 (!) 89/60  113/74  Pulse: 75 79  84  Resp:      Temp: 98 F (36.7 C)     TempSrc: Oral     SpO2: 100%  96%   Weight:      Height:        Intake/Output Summary (Last 24 hours) at 06/03/2019 1138 Last data filed at 06/03/2019 0814 Gross per 24 hour  Intake 597 ml  Output -  Net 597 ml   Filed Weights   05/29/19 0537 05/30/19 0255 05/31/19 0422  Weight: 42.3 kg 44.1 kg 44.3 kg    Examination:  General exam: Debilitated/deconditioned, appears older than age HEENT:PERRL,Oral mucosa moist, Ear/Nose normal on gross exam Respiratory system: Bilateral equal air entry,  normal vesicular breath sounds, no wheezes or crackles  Cardiovascular system: S1 & S2 heard, RRR. No JVD, murmurs, rubs, gallops or clicks. No pedal edema. Gastrointestinal system: Abdomen is nondistended, soft and nontender. No organomegaly or masses felt. Normal bowel sounds heard. Central nervous system:  Alert and oriented. No focal neurological deficits. Extremities: No edema, no clubbing ,no cyanosis, distal peripheral pulses palpable. Skin: No rashes, lesions or ulcers,no icterus ,no pallor     Data Reviewed: I have personally reviewed following labs and imaging studies  CBC: Recent Labs  Lab 05/28/19 0915  WBC 9.8  NEUTROABS 5.7  HGB 9.4*  HCT 31.2*  MCV 100.0  PLT 155*   Basic Metabolic Panel: Recent Labs  Lab 05/28/19 0915  NA 138  K 4.8  CL 87*  CO2 39*  GLUCOSE 83  BUN 32*  CREATININE 0.66  CALCIUM 10.4*   GFR: Estimated Creatinine Clearance: 53.6 mL/min (by C-G formula based on SCr of 0.66 mg/dL). Liver Function Tests: Recent Labs  Lab 05/28/19 0915  AST 11*  ALT 21  ALKPHOS 72  BILITOT 0.2*  PROT 5.6*  ALBUMIN 2.6*   No results for input(s): LIPASE, AMYLASE in the last 168 hours. No results for input(s): AMMONIA in the last 168 hours. Coagulation Profile: No results for input(s): INR, PROTIME in the last 168 hours. Cardiac Enzymes: No results for input(s): CKTOTAL, CKMB, CKMBINDEX, TROPONINI in the last 168 hours. BNP (last 3 results) No results for input(s): PROBNP in the last 8760 hours. HbA1C: No results for input(s): HGBA1C in the last 72 hours. CBG: Recent Labs  Lab 06/02/19 2340 06/03/19 0510 06/03/19 0735 06/03/19 0810 06/03/19 1127  GLUCAP 109* 127* 60* 104* 154*   Lipid Profile: No results for input(s): CHOL, HDL, LDLCALC, TRIG, CHOLHDL, LDLDIRECT in the last 72 hours. Thyroid Function Tests: No results for input(s): TSH, T4TOTAL, FREET4, T3FREE, THYROIDAB in the last 72 hours. Anemia Panel: No results for input(s): VITAMINB12, FOLATE, FERRITIN, TIBC, IRON, RETICCTPCT in the last 72 hours. Sepsis Labs: No results for input(s): PROCALCITON, LATICACIDVEN in the last 168 hours.  Recent Results (from the past 240 hour(s))  SARS CORONAVIRUS 2 (TAT 6-24 HRS) Nasopharyngeal Nasopharyngeal Swab     Status: None   Collection Time:  05/29/19  2:03 PM   Specimen: Nasopharyngeal Swab  Result Value Ref Range Status   SARS Coronavirus 2 NEGATIVE NEGATIVE Final    Comment: (NOTE) SARS-CoV-2 target nucleic acids are NOT DETECTED. The SARS-CoV-2 RNA is generally detectable in upper and lower respiratory specimens during the acute phase of infection. Negative results do not preclude SARS-CoV-2 infection, do not rule out co-infections with other pathogens, and should not be used as the sole basis for treatment or other patient management decisions. Negative results must be combined with clinical observations, patient history, and epidemiological information. The expected result is Negative. Fact Sheet for Patients: SugarRoll.be Fact Sheet for Healthcare Providers: https://www.woods-mathews.com/ This test is not yet approved or cleared by the [place] FDA and  has been authorized for detection and/or diagnosis of SARS-CoV-2 by FDA under an Emergency Use Authorization (EUA). This EUA will remain  in effect (meaning this test can be used) for the duration of the COVID-19 declaration under Section 56 4(b)(1) of the Act, 21 U.S.C. section 360bbb-3(b)(1), unless the authorization is terminated or revoked sooner. Performed at [facility], [place] [address], [place], [place] [address]          Radiology Studies: No results found.  Scheduled Meds: . budesonide (PULMICORT) nebulizer solution  0.5 mg Nebulization BID  . chlorhexidine gluconate (MEDLINE KIT)  15 mL Mouth Rinse BID  . famotidine  20 mg Oral QHS  . feeding supplement (ENSURE ENLIVE)  237 mL Oral TID BM  . feeding supplement (PRO-STAT SUGAR FREE 64)  30 mL Oral TID WC  . gabapentin  300 mg Oral Q8H  . insulin aspart  0-15 Units Subcutaneous Q4H  . ipratropium-albuterol  3 mL Nebulization BID  . mouth rinse  15 mL Mouth Rinse BID  . Melatonin  3 mg Per Tube QHS  . metoprolol tartrate  12.5 mg Oral  BID  . sildenafil  20 mg Oral TID  . venlafaxine  75 mg Oral BID WC   Continuous Infusions: . sodium chloride 10 mL/hr at 05/24/19 0600     LOS: 14 days    Time spent: 15 mins.More than 50% of that time was spent in counseling and/or coordination of care.      Shelly Coss, MD [facility] Pager [phone]  If 7PM-7AM, please contact night-coverage [email or web address] Password [email or web address] 06/03/2019, 11:38 AM

## 2019-06-04 LAB — GLUCOSE, CAPILLARY
Glucose-Capillary: 134 mg/dL — ABNORMAL HIGH (ref 70–99)
Glucose-Capillary: 63 mg/dL — ABNORMAL LOW (ref 70–99)
Glucose-Capillary: 64 mg/dL — ABNORMAL LOW (ref 70–99)
Glucose-Capillary: 65 mg/dL — ABNORMAL LOW (ref 70–99)
Glucose-Capillary: 112 mg/dL — ABNORMAL HIGH (ref 70–99)
Glucose-Capillary: 107 mg/dL — ABNORMAL HIGH (ref 70–99)
Glucose-Capillary: 88 mg/dL (ref 70–99)
Glucose-Capillary: 98 mg/dL (ref 70–99)
Glucose-Capillary: 169 mg/dL — ABNORMAL HIGH (ref 70–99)
Glucose-Capillary: 90 mg/dL (ref 70–99)

## 2019-06-04 MED ORDER — DEXTROSE 50 % IV SOLN
INTRAVENOUS | Status: AC
  Filled 2019-06-04: qty 50

## 2019-06-04 NOTE — Progress Notes (Signed)
PROGRESS NOTE    Krystal Cantu  PPJ:[id number] DOB: 06-03-1961 DOA: 05/20/2019 PCP: Bonnita Nasuti, MD   Brief Narrative:  Patient iis a 58 year old female patient with a complicated pulmonary history as mentioned below followed by Dr. Melvyn Novas. Last hospitalized in August 2020 for acute on chronic respiratory failure felt Presented to the emergency room on 05/20/2019 via EMS after being found at home unresponsive on. Apparently had been in normal state of health the night prior. She remained unresponsive, did not improve with BiPAP so was placed on mechanical ventilator and subsequently transferred to [facility] for further evaluation.   She was extubated on 05/23/2019 and was subsequently transferred under [facility] care on 05/25/2019. She was also seen by palliative care and was made DNR.  Currently her respiratory status is back to baseline.  PCCM has signed off.  She was seen by PT/OT and recommended skilled facility on discharge. She is hemodynamically stable for discharge to SNF as soon as insurance authorization is complete.  Assessment & Plan:   Active Problems:   Dyspnea   COPD exacerbation (HCC)   Endotracheally intubated   Malnutrition of moderate degree   Palliative care by specialist   DNR (do not resuscitate) discussion   DNR (do not resuscitate)   Weakness generalized   Anxiety   Acute on chronic hypoxemic hypercarbic respiratory failure: Acute exacerbation of her underlying chronic respiratory disease. Appears to have h/o a smoking-related interstitial lung disease, COPD, and prior diagnosis of BOOP dating back from 2006. Severe restrictive lung disease at baseline. Likely has secondary pulmonary hypertension to underlying lung disease, on sildenafil . Patient stable post extubation. She is down to only 3 L oxygen . She is much more comfortable.  Continue bronchodilators and supplemented oxygen  Hypotension: Currently BP stable  Anxiety:Continue PRN clonazepam.Also  on venlafaxine  Physical deconditioning:Consulted PT.Recommended SNF  Intermittent sinus tachycardia:Controlled. Continue Lopressor 12.5 mg twice daily.   Nutrition Problem: Moderate Malnutrition Etiology: chronic illness(chronic lung disease (COPD, restrictive lung disease), CHF)      DVT prophylaxis:Lovenox Code Status: Full Family Communication: Called brother Daryle Boyington for update on 06/03/19 Disposition Plan: SNF as soon as possible   Consultants: PCCM  Procedures:Intubation  Antimicrobials:  Anti-infectives (From admission, onward)   Start     Dose/Rate Route Frequency Ordered Stop   05/20/19 1500  azithromycin (ZITHROMAX) 500 mg in sodium chloride 0.9 % 250 mL IVPB     500 mg 250 mL/hr over 60 Minutes Intravenous Every 24 hours 05/20/19 1450 05/22/19 1718      Subjective:  Patient seen and examined at bedside this morning. Comfortable.  No new issues.  Waiting for authorization .  Objective: Vitals:   06/03/19 2015 06/03/19 2131 06/03/19 2300 06/04/19 0735  BP:  97/63 100/60 (!) 93/58  Pulse:  73 72 89  Resp:  '18 16 16  '$ Temp:  98.1 F (36.7 C) 98 F (36.7 C) 98.2 F (36.8 C)  TempSrc:  Oral Oral Oral  SpO2: 96% 100% 100% 100%  Weight:      Height:        Intake/Output Summary (Last 24 hours) at 06/04/2019 1059 Last data filed at 06/04/2019 0847 Gross per 24 hour  Intake 1200 ml  Output -  Net 1200 ml   Filed Weights   05/29/19 0537 05/30/19 0255 05/31/19 0422  Weight: 42.3 kg 44.1 kg 44.3 kg    Examination:  General exam: Appears calm and comfortable , deconditioned/debilitated female, appears older than age HEENT:PERRL,Oral mucosa  moist, Ear/Nose normal on gross exam Respiratory system: Bilateral equal air entry, normal vesicular breath sounds, no wheezes or crackles  Cardiovascular system: S1 & S2 heard, RRR. No JVD, murmurs, rubs, gallops or clicks. Gastrointestinal system: Abdomen is nondistended, soft and nontender. No  organomegaly or masses felt. Normal bowel sounds heard. Central nervous system: Alert and oriented. No focal neurological deficits. Extremities: No edema, no clubbing ,no cyanosis, distal peripheral pulses palpable. Skin: No rashes, lesions or ulcers,no icterus ,no pallor     Data Reviewed: I have personally reviewed following labs and imaging studies  CBC: No results for input(s): WBC, NEUTROABS, HGB, HCT, MCV, PLT in the last 168 hours. Basic Metabolic Panel: No results for input(s): NA, K, CL, CO2, GLUCOSE, BUN, CREATININE, CALCIUM, MG, PHOS in the last 168 hours. GFR: Estimated Creatinine Clearance: 53.6 mL/min (by C-G formula based on SCr of 0.66 mg/dL). Liver Function Tests: No results for input(s): AST, ALT, ALKPHOS, BILITOT, PROT, ALBUMIN in the last 168 hours. No results for input(s): LIPASE, AMYLASE in the last 168 hours. No results for input(s): AMMONIA in the last 168 hours. Coagulation Profile: No results for input(s): INR, PROTIME in the last 168 hours. Cardiac Enzymes: No results for input(s): CKTOTAL, CKMB, CKMBINDEX, TROPONINI in the last 168 hours. BNP (last 3 results) No results for input(s): PROBNP in the last 8760 hours. HbA1C: No results for input(s): HGBA1C in the last 72 hours. CBG: Recent Labs  Lab 06/04/19 0348 06/04/19 0349 06/04/19 0351 06/04/19 0524 06/04/19 0734  GLUCAP 64* 65* 63* 112* 107*   Lipid Profile: No results for input(s): CHOL, HDL, LDLCALC, TRIG, CHOLHDL, LDLDIRECT in the last 72 hours. Thyroid Function Tests: No results for input(s): TSH, T4TOTAL, FREET4, T3FREE, THYROIDAB in the last 72 hours. Anemia Panel: No results for input(s): VITAMINB12, FOLATE, FERRITIN, TIBC, IRON, RETICCTPCT in the last 72 hours. Sepsis Labs: No results for input(s): PROCALCITON, LATICACIDVEN in the last 168 hours.  Recent Results (from the past 240 hour(s))  SARS CORONAVIRUS 2 (TAT 6-24 HRS) Nasopharyngeal Nasopharyngeal Swab     Status: None    Collection Time: 05/29/19  2:03 PM   Specimen: Nasopharyngeal Swab  Result Value Ref Range Status   SARS Coronavirus 2 NEGATIVE NEGATIVE Final    Comment: (NOTE) SARS-CoV-2 target nucleic acids are NOT DETECTED. The SARS-CoV-2 RNA is generally detectable in upper and lower respiratory specimens during the acute phase of infection. Negative results do not preclude SARS-CoV-2 infection, do not rule out co-infections with other pathogens, and should not be used as the sole basis for treatment or other patient management decisions. Negative results must be combined with clinical observations, patient history, and epidemiological information. The expected result is Negative. Fact Sheet for Patients: SugarRoll.be Fact Sheet for Healthcare Providers: https://www.woods-mathews.com/ This test is not yet approved or cleared by the [place] FDA and  has been authorized for detection and/or diagnosis of SARS-CoV-2 by FDA under an Emergency Use Authorization (EUA). This EUA will remain  in effect (meaning this test can be used) for the duration of the COVID-19 declaration under Section 56 4(b)(1) of the Act, 21 U.S.C. section 360bbb-3(b)(1), unless the authorization is terminated or revoked sooner. Performed at [facility], [place] [address], [place], [place] [address]          Radiology Studies: No results found.      Scheduled Meds: . budesonide (PULMICORT) nebulizer solution  0.5 mg Nebulization BID  . chlorhexidine gluconate (MEDLINE KIT)  15 mL Mouth Rinse  BID  . dextrose      . famotidine  20 mg Oral QHS  . feeding supplement (ENSURE ENLIVE)  237 mL Oral TID BM  . feeding supplement (PRO-STAT SUGAR FREE 64)  30 mL Oral TID WC  . gabapentin  300 mg Oral Q8H  . insulin aspart  0-15 Units Subcutaneous Q4H  . ipratropium-albuterol  3 mL Nebulization BID  . mouth rinse  15 mL Mouth Rinse BID  . Melatonin  3 mg Per Tube QHS  .  metoprolol tartrate  12.5 mg Oral BID  . sildenafil  20 mg Oral TID  . venlafaxine  75 mg Oral BID WC   Continuous Infusions: . sodium chloride 10 mL/hr at 05/24/19 0600     LOS: 15 days    Time spent: 15 mins.More than 50% of that time was spent in counseling and/or coordination of care.      Shelly Coss, MD [facility] Pager [phone]  If 7PM-7AM, please contact night-coverage [email or web address] Password [email or web address] 06/04/2019, 10:59 AM

## 2019-06-04 NOTE — Progress Notes (Signed)
Patient stated she would prefer to go home at discharge. She says she has a daughter who can be there to assist her. She has been able to ambulate to the bathroom and back unassisted and get into the recliner to sit during the day. Her appetite is improving. She does sometimes have mild hypotention and hypoglycemia, but this has been manageable by delaying Metoprolol doses to later in the day and holding insulin. Pt has not had any complaint of syncope the last several days. She did not go into depth as to why, but she does not want her brother to think that she is asking to go directly home, although that is her wish.

## 2019-06-04 NOTE — Progress Notes (Signed)
Pt's blood sugar at 0430 am was reported to be 63mg /dl, 66mg /dl and 65mg /dl, pt asymptomatic. Pt given two cups of sugar sweetened orange juice and two tubs of peanut butter. Repeat blood sugar at 0530 am was 112mg /dl. VSS, pt comfortable no s/s of distress, will continue to monitor.

## 2019-06-05 LAB — GLUCOSE, CAPILLARY
Glucose-Capillary: 122 mg/dL — ABNORMAL HIGH (ref 70–99)
Glucose-Capillary: 104 mg/dL — ABNORMAL HIGH (ref 70–99)
Glucose-Capillary: 71 mg/dL (ref 70–99)
Glucose-Capillary: 122 mg/dL — ABNORMAL HIGH (ref 70–99)
Glucose-Capillary: 128 mg/dL — ABNORMAL HIGH (ref 70–99)

## 2019-06-05 LAB — SARS CORONAVIRUS 2 (TAT 6-24 HRS): SARS Coronavirus 2: NEGATIVE

## 2019-06-05 MED ORDER — TRAZODONE HCL 50 MG PO TABS
50.0000 mg | ORAL_TABLET | Freq: Once | ORAL | Status: AC
  Administered 2019-06-05: 50 mg via ORAL
  Filled 2019-06-05: qty 1

## 2019-06-05 NOTE — Progress Notes (Signed)
PROGRESS NOTE    Krystal Cantu  AXK:[id number] DOB: 21-Oct-1960 DOA: 05/20/2019 PCP: Bonnita Nasuti, MD   Brief Narrative:  Patient iis a 58 year old female  with a complicated pulmonary history as mentioned below followed by Dr. Melvyn Novas. Last hospitalized in August 2020 for acute on chronic respiratory failure .Presented to the emergency room on 05/20/2019 via EMS after being found at home unresponsive on. Apparently had been in normal state of health the night prior. She remained unresponsive, did not improve with BiPAP so was placed on mechanical ventilator and subsequently transferred to [facility] for further evaluation.   She was extubated on 05/23/2019 and was subsequently transferred under [facility] care on 05/25/2019. She was also seen by palliative care and was made DNR.  Currently her respiratory status is back to baseline.  PCCM has signed off.  She was seen by PT/OT and recommended skilled facility on discharge. She is hemodynamically stable for discharge to SNF as soon as insurance authorization is complete.  Assessment & Plan:   Active Problems:   Dyspnea   COPD exacerbation (HCC)   Endotracheally intubated   Malnutrition of moderate degree   Palliative care by specialist   DNR (do not resuscitate) discussion   DNR (do not resuscitate)   Weakness generalized   Anxiety   Acute on chronic hypoxemic hypercarbic respiratory failure: Acute exacerbation of her underlying chronic respiratory disease. Appears to have h/o a smoking-related interstitial lung disease, COPD, and prior diagnosis of BOOP dating back from 2006. Severe restrictive lung disease at baseline. Likely has secondary pulmonary hypertension to underlying lung disease, on sildenafil . Patient stable post extubation. She is down to only 3 L oxygen . She is much more comfortable.  Continue bronchodilators and supplemented oxygen  Hypotension: Currently BP stable  Anxiety:Continue PRN clonazepam.Also on  venlafaxine  Physical deconditioning:Consulted PT.Recommended SNF  Intermittent sinus tachycardia:Controlled. Continue Lopressor 12.5 mg twice daily.   Nutrition Problem: Moderate Malnutrition Etiology: chronic illness(chronic lung disease (COPD, restrictive lung disease), CHF)      DVT prophylaxis:Lovenox Code Status: Full Family Communication: Called brother Emmagrace Runkel for update on 06/03/19 Disposition Plan: SNF as soon as possible   Consultants: PCCM  Procedures:Intubation  Antimicrobials:  Anti-infectives (From admission, onward)   Start     Dose/Rate Route Frequency Ordered Stop   05/20/19 1500  azithromycin (ZITHROMAX) 500 mg in sodium chloride 0.9 % 250 mL IVPB     500 mg 250 mL/hr over 60 Minutes Intravenous Every 24 hours 05/20/19 1450 05/22/19 1718      Subjective:  Patient seen and examined the bedside this morning.  Hemodynamically stable.  No new changes. She is  eager to go home if we cannot get insurance authorization for skilled facility  Objective: Vitals:   06/04/19 1953 06/04/19 2128 06/05/19 0816 06/05/19 0821  BP:  (!) 110/56  116/64  Pulse:  89  (!) 109  Resp:  18  18  Temp:  98 F (36.7 C)  98 F (36.7 C)  TempSrc:  Oral  Oral  SpO2: 98% 95% 94% (!) 88%  Weight:      Height:        Intake/Output Summary (Last 24 hours) at 06/05/2019 1048 Last data filed at 06/05/2019 1000 Gross per 24 hour  Intake 240 ml  Output 1000 ml  Net -760 ml   Filed Weights   05/29/19 0537 05/30/19 0255 05/31/19 0422  Weight: 42.3 kg 44.1 kg 44.3 kg    Examination:  General exam: Appears  calm and comfortable ,Not in distress, very deconditioned, debilitated  HEENT:PERRL,Oral mucosa moist, Ear/Nose normal on gross exam Respiratory system: Bilateral equal air entry, normal vesicular breath sounds, no wheezes or crackles  Cardiovascular system: S1 & S2 heard, RRR. No JVD, murmurs, rubs, gallops or clicks. Gastrointestinal system: Abdomen is  nondistended, soft and nontender. No organomegaly or masses felt. Normal bowel sounds heard. Central nervous system: Alert and oriented. No focal neurological deficits. Extremities: No edema, no clubbing ,no cyanosis, distal peripheral pulses palpable. Skin: No rashes, lesions or ulcers,no icterus ,no pallor    Data Reviewed: I have personally reviewed following labs and imaging studies  CBC: No results for input(s): WBC, NEUTROABS, HGB, HCT, MCV, PLT in the last 168 hours. Basic Metabolic Panel: No results for input(s): NA, K, CL, CO2, GLUCOSE, BUN, CREATININE, CALCIUM, MG, PHOS in the last 168 hours. GFR: Estimated Creatinine Clearance: 53.6 mL/min (by C-G formula based on SCr of 0.66 mg/dL). Liver Function Tests: No results for input(s): AST, ALT, ALKPHOS, BILITOT, PROT, ALBUMIN in the last 168 hours. No results for input(s): LIPASE, AMYLASE in the last 168 hours. No results for input(s): AMMONIA in the last 168 hours. Coagulation Profile: No results for input(s): INR, PROTIME in the last 168 hours. Cardiac Enzymes: No results for input(s): CKTOTAL, CKMB, CKMBINDEX, TROPONINI in the last 168 hours. BNP (last 3 results) No results for input(s): PROBNP in the last 8760 hours. HbA1C: No results for input(s): HGBA1C in the last 72 hours. CBG: Recent Labs  Lab 06/04/19 1550 06/04/19 1931 06/04/19 2308 06/05/19 0318 06/05/19 0816  GLUCAP 98 169* 90 122* 104*   Lipid Profile: No results for input(s): CHOL, HDL, LDLCALC, TRIG, CHOLHDL, LDLDIRECT in the last 72 hours. Thyroid Function Tests: No results for input(s): TSH, T4TOTAL, FREET4, T3FREE, THYROIDAB in the last 72 hours. Anemia Panel: No results for input(s): VITAMINB12, FOLATE, FERRITIN, TIBC, IRON, RETICCTPCT in the last 72 hours. Sepsis Labs: No results for input(s): PROCALCITON, LATICACIDVEN in the last 168 hours.  Recent Results (from the past 240 hour(s))  SARS CORONAVIRUS 2 (TAT 6-24 HRS) Nasopharyngeal  Nasopharyngeal Swab     Status: None   Collection Time: 05/29/19  2:03 PM   Specimen: Nasopharyngeal Swab  Result Value Ref Range Status   SARS Coronavirus 2 NEGATIVE NEGATIVE Final    Comment: (NOTE) SARS-CoV-2 target nucleic acids are NOT DETECTED. The SARS-CoV-2 RNA is generally detectable in upper and lower respiratory specimens during the acute phase of infection. Negative results do not preclude SARS-CoV-2 infection, do not rule out co-infections with other pathogens, and should not be used as the sole basis for treatment or other patient management decisions. Negative results must be combined with clinical observations, patient history, and epidemiological information. The expected result is Negative. Fact Sheet for Patients: SugarRoll.be Fact Sheet for Healthcare Providers: https://www.woods-mathews.com/ This test is not yet approved or cleared by the [place] FDA and  has been authorized for detection and/or diagnosis of SARS-CoV-2 by FDA under an Emergency Use Authorization (EUA). This EUA will remain  in effect (meaning this test can be used) for the duration of the COVID-19 declaration under Section 56 4(b)(1) of the Act, 21 U.S.C. section 360bbb-3(b)(1), unless the authorization is terminated or revoked sooner. Performed at [facility], [place] [address], [place], [place] [address]          Radiology Studies: No results found.      Scheduled Meds: . budesonide (PULMICORT) nebulizer solution  0.5 mg Nebulization BID  .  chlorhexidine gluconate (MEDLINE KIT)  15 mL Mouth Rinse BID  . famotidine  20 mg Oral QHS  . feeding supplement (ENSURE ENLIVE)  237 mL Oral TID BM  . feeding supplement (PRO-STAT SUGAR FREE 64)  30 mL Oral TID WC  . gabapentin  300 mg Oral Q8H  . insulin aspart  0-15 Units Subcutaneous Q4H  . ipratropium-albuterol  3 mL Nebulization BID  . mouth rinse  15 mL Mouth Rinse BID  . Melatonin   3 mg Per Tube QHS  . metoprolol tartrate  12.5 mg Oral BID  . sildenafil  20 mg Oral TID  . venlafaxine  75 mg Oral BID WC   Continuous Infusions: . sodium chloride 10 mL/hr at 05/24/19 0600     LOS: 16 days    Time spent: 15 mins.More than 50% of that time was spent in counseling and/or coordination of care.      Shelly Coss, MD [facility] Pager [phone]  If 7PM-7AM, please contact night-coverage [email or web address] Password [email or web address] 06/05/2019, 10:48 AM

## 2019-06-05 NOTE — Progress Notes (Signed)
Patient ID: Krystal Cantu, female   DOB: [birth year], 58 y.o.   MRN: [id number]   This NP spoke to bedside RN and family and LCSW/Krystal Cantu for palliative medicine needs and continued discussion regarding next transiton of care  Patient continues to remain stable on current treatment plan, however she remains high risk for decompensation secondary to her pulmonary disease.   Plan of care; -DNR/DNI -No further BiPAP use, instead utilize PRN medications for symptom management    -Morphine IV/dyspnea and Ativan IV/agitation --will convert to oral today in anticipation for discharge -continue to monitor outcomes  Patient and family agree that if the patient stabilizes and is eligible for skilled nursing facility for rehab that would be the best outcome,   [facility] skilled nursing facility is their first choice. According to EMR social work is working on placement.  Patient continues to await insurance authorization to SNF. Patient cannot discharge without a decision from [facility] Medicare.     In frustration patient is telling nursing that she wants to go home and has family to care for her.  I spoke to Krystal Cantu and Krystal Cantu and Krystal Cantu by phone today.    In reality there is no one that can care for her at home according to her daughter Krystal Cantu/ [phone] and her brother Krystal Cantu/#[phone].  Apparently there is another daughter who "has issues" and is not able to commit to consistent care.   Krystal Cantu/patient agrees that she should not be home alone and cannot care for herself safely, she "just wants to get out of the hospital"   If while waiting for disposition she declines,  patient and family are open to residential hospice for end-of-life care, [facility].  I spoke to patient's brother Krystal Cantu by telephone and daughter Krystal Cantu # [phone], they have no questions or concerns.  All understand and agree with the plan   Questions and concerns addressed.   PMT will continue  to support holistically   Discussed with Dr Krystal Cantu  Total time spent on the unit was 45 minutes.  Greater than 50% of the time was spent in counseling and coordination of care.  Krystal Lessen NP  Palliative Medicine Team Team Phone # [phone] Pager [phone]

## 2019-06-06 DIAGNOSIS — R531 Weakness: Secondary | ICD-10-CM

## 2019-06-06 LAB — GLUCOSE, CAPILLARY
Glucose-Capillary: 158 mg/dL — ABNORMAL HIGH (ref 70–99)
Glucose-Capillary: 99 mg/dL (ref 70–99)
Glucose-Capillary: 97 mg/dL (ref 70–99)
Glucose-Capillary: 146 mg/dL — ABNORMAL HIGH (ref 70–99)
Glucose-Capillary: 116 mg/dL — ABNORMAL HIGH (ref 70–99)
Glucose-Capillary: 108 mg/dL — ABNORMAL HIGH (ref 70–99)
Glucose-Capillary: 127 mg/dL — ABNORMAL HIGH (ref 70–99)

## 2019-06-06 NOTE — Progress Notes (Signed)
..    Vital Signs MEWS/VS Documentation      06/05/2019 1628 06/05/2019 2007 06/05/2019 2130 06/05/2019 2305   MEWS Score:  0  -  0  2   MEWS Score Color:  Green  -  Green  Yellow   Resp:  20  -  -  18   Pulse:  87  -  -  (!) 118   BP:  102/63  -  -  137/78   Temp:  98.7 F (37.1 C)  -  -  98.4 F (36.9 C)   O2 Device:  Nasal Cannula  Nasal Cannula  -  -   O2 Flow Rate (L/min):  -  4 L/min  -  -   Level of Consciousness:  -  -  Alert  -     No acute changes with patient.  Patient is resting comfortably.  Will monitor vitals every 2 hours.      Jacqulyn Ducking 06/06/2019,1:26 AM

## 2019-06-06 NOTE — Plan of Care (Signed)
  Problem: Education: Goal: Knowledge of General Education information will improve Description: Including pain rating scale, medication(s)/side effects and non-pharmacologic comfort measures Outcome: Progressing   Problem: Clinical Measurements: Goal: Will remain free from infection Outcome: Progressing   Problem: Activity: Goal: Risk for activity intolerance will decrease Outcome: Progressing   Problem: Nutrition: Goal: Adequate nutrition will be maintained Outcome: Progressing   Problem: Pain Managment: Goal: General experience of comfort will improve Outcome: Progressing   

## 2019-06-06 NOTE — TOC Progression Note (Signed)
Transition of Care [facility]) - Progression Note    Patient Details  Name: Krystal Cantu MRN: [id number] Date of Birth: [birth year]  Transition of Care [facility]) CM/SW [place], [place] Phone Number: 06/06/2019, 3:53 PM  Clinical Narrative:  CSW checked in multiple times with [facility] admissions to ask about authorization request. Authorization is still pending. Per Admissions, they asked for updated notes. CSW reviewed chart this morning, and a new PT note was needed. CSW asked PT assigned to see patient and put a note in. PT updated CSW after note was entered, and CSW sent the updated note to [employer]. [facility] Admissions sent to [facility]. Response has still not been received. CSW spoke with CSW Team Lead about pending authorization, and Team Lead contacted [facility] to discuss. [employer] to investigate the delay in processing the request.  Authorization has still not been received.    Expected Discharge Plan: Skilled Nursing Facility Barriers to Discharge: Insurance Authorization  Expected Discharge Plan and Services Expected Discharge Plan: [place] In-house Referral: Clinical Social Work, Hospice / Palliative Care Discharge Planning Services: CM Consult Post Acute Care Choice: [place] arrangements for the past 2 months: Single Family Home Expected Discharge Date: 06/06/19                                     Social Determinants of Health (SDOH) Interventions    Readmission Risk Interventions No flowsheet data found.

## 2019-06-06 NOTE — Progress Notes (Signed)
Physical Therapy Treatment Patient Details Name: Krystal Cantu MRN: [id number] DOB: 1960-10-05 Today's Date: 06/06/2019    History of Present Illness Pt adm with acute on chronic respiratory failure requiring intubation  9/26. One way extubation 9/29. PMH - copd, boop vs ILD, anxiety, depression    PT Comments    Pt agreeable to activity.  Limited most by pulmonary issues.  Sats with activity on 4L [place] dropped consistently to the 78-82% range and lower during gait.  EHR ranged from 99-115 bpm.  Pt taking 2-3 min to recover to >90%  Emphasis on standing exercise and gait while monitoring sats.    Follow Up Recommendations  SNF     Equipment Recommendations  None recommended by PT    Recommendations for Other Services       Precautions / Restrictions Precautions Precautions: Fall;Other (comment) Precaution Comments: watch SpO2    Mobility  Bed Mobility Overal bed mobility: Needs Assistance Bed Mobility: Supine to Sit;Sit to Supine     Supine to sit: Supervision Sit to supine: Supervision   General bed mobility comments: no assist needed to get EOB or back in bed.  Transfers Overall transfer level: Needs assistance Equipment used: Rolling walker (2 wheeled) Transfers: Sit to/from Stand Sit to Stand: Min guard         General transfer comment: no assist to power up, but more struggle to stand with each successive trial.  Ambulation/Gait Ambulation/Gait assistance: Min guard Gait Distance (Feet): 22 Feet(then 70 feet with the RW) Assistive device: Rolling walker (2 wheeled) Gait Pattern/deviations: Step-through pattern;Trunk flexed Gait velocity: decreased   General Gait Details: slow, low amplitude gait on 4L [place].  Sats consistently dropping to 78-82% on 4L during activity.  Down to 72% during the longer distance gait.  HR rose with activity from 99-115 bpm.   Stairs             Wheelchair Mobility    Modified Rankin (Stroke Patients Only)        Balance     Sitting balance-Leahy Scale: Fair       Standing balance-Leahy Scale: Fair Standing balance comment: preferring to use UE support.                            Cognition Arousal/Alertness: Awake/alert Behavior During Therapy: WFL for tasks assessed/performed Overall Cognitive Status: Within Functional Limits for tasks assessed                                        Exercises General Exercises - Lower Extremity Hip ABduction/ADduction: AROM;Both;10 reps;Standing Hip Flexion/Marching: AROM;Both;10 reps;Standing    General Comments        Pertinent Vitals/Pain Faces Pain Scale: No hurt Pain Intervention(s): Monitored during session    Home Living                      Prior Function            PT Goals (current goals can now be found in the care plan section) Acute Rehab PT Goals Patient Stated Goal: get stronger PT Goal Formulation: With patient Time For Goal Achievement: 06/08/19 Potential to Achieve Goals: Fair Progress towards PT goals: Progressing toward goals    Frequency    Min 2X/week      PT Plan Current plan remains appropriate  Co-evaluation              AM-PAC PT "6 Clicks" Mobility   Outcome Measure  Help needed turning from your back to your side while in a flat bed without using bedrails?: A Little Help needed moving from lying on your back to sitting on the side of a flat bed without using bedrails?: A Little Help needed moving to and from a bed to a chair (including a wheelchair)?: A Little Help needed standing up from a chair using your arms (e.g., wheelchair or bedside chair)?: A Little Help needed to walk in hospital room?: A Little Help needed climbing 3-5 steps with a railing? : A Lot 6 Click Score: 17    End of Session Equipment Utilized During Treatment: Oxygen Activity Tolerance: Patient limited by fatigue Patient left: in bed;with call bell/phone within reach Nurse  Communication: Mobility status PT Visit Diagnosis: Other abnormalities of gait and mobility (R26.89);Difficulty in walking, not elsewhere classified (R26.2)     Time: [id number] PT Time Calculation (min) (ACUTE ONLY): 42 min  Charges:  $Gait Training: 8-22 mins $Therapeutic Exercise: 8-22 mins $Therapeutic Activity: 8-22 mins                     06/06/2019  Donnella Sham, PT Acute Rehabilitation Services [phone]  (pager) [phone]  (office)   Krystal Cantu 06/06/2019, 1:16 PM

## 2019-06-06 NOTE — Progress Notes (Signed)
Patient is having second thoughts about DNR status.  Patient knows she does not want to be placed on a ventilator, but is unsure about not having any type of intervention in case she stops breathing.  This RN explained she could request a partial DNR where only medications would be administered.  Patient stated she wants to discuss a change in code status with her family before making a final decision.

## 2019-06-06 NOTE — Progress Notes (Signed)
Patient ID: Marjorie Mareno, female   DOB: Oct 28, 1960, 58 y.o.   MRN: [id number]   This NP spoke to patient by telephone this morning for palliative medicine needs and continued discussion regarding next transiton of care and to offer emotional support.  Created space and opportunity for Adrielle to explore her thoughts and feeling regarding current medical situation.  She is frustrated with "waitng for a bed" and still hopes to transition to [facility] SNF for rehab.  Patient continues to remain stable on current treatment plan, however she remains high risk for decompensation secondary to her pulmonary disease.   Plan of care; -DNR/DNI   ( we discussed this again today and she remains comfortable with her decision for DNR/DNI) -No further BiPAP use, instead utilize PRN medications for symptom management    -Morphine/dyspnea and Ativan /agitation -   According to EMR social work is working on placement.  Patient continues to await insurance authorization to SNF. Patient cannot discharge without a decision from [facility] Medicare.     If while waiting for disposition she declines,  patient and family are open to residential hospice for end-of-life care, their choice would be [place] residential hospice.  Questions and concerns addressed.    PMT will continue to support holistically  Total time spent on the unit was 20 minutes.  Greater than 50% of the time was spent in counseling and coordination of care.  Wadie Lessen NP  Palliative Medicine Team Team Phone # [phone] Pager [phone]

## 2019-06-06 NOTE — Progress Notes (Signed)
Pt stated she felt s.o.b. when walking from bed to bathroom. O2 sats checked and noted at 88% on O2. No increases were made and pt was able to increase sats to 90-92%.

## 2019-06-06 NOTE — Discharge Summary (Signed)
Physician Discharge Summary [facility]    Patient: Krystal Cantu                   Admit date: 05/20/2019   DOB: 1961/01/07             Discharge date:06/06/2019/10:24 AM [id number]                          PCP: Bonnita Nasuti, MD  Disposition: SNF   Recommendations for Outpatient Follow-up:    Follow up: in 1 week  Discharge Condition: Stable   Code Status:   Code Status: DNR  Diet recommendation: Cardiac diet   Discharge Diagnoses:    Active Problems:   Dyspnea   COPD exacerbation (HCC)   Endotracheally intubated   Malnutrition of moderate degree   Palliative care by specialist   DNR (do not resuscitate) discussion   DNR (do not resuscitate)   Weakness generalized   Anxiety   History of Present Illness/ Hospital Course [name] Summary:   NO changes please see D/C summary below Dr. Tawanna Solo below  Brief/Interim Summary:  Patient iis a 58 year old female patient with a complicated pulmonary history as mentioned below followed by Dr. Melvyn Novas. Last hospitalized in August 2020 for acute on chronic respiratory failure.Presented to the emergency room on 05/20/2019 via EMS after being found at home unresponsive on. Apparently had been in normal state of health the night prior. She remained unresponsive, did not improve with BiPAP so was placed on mechanical ventilator and subsequently transferred to [facility] for further evaluation.   She was extubated on 05/23/2019 and was subsequently transferred under [facility] care on 05/25/2019. She was also seen by palliative care and was made DNR.  Currently her respiratory status is back to baseline.  PCCM has signed off.  She was seen by PT/OT and recommended skilled facility on discharge.  06/06/19 -Currently stable on 4 L of O2 via [place] > 92% She is hemodynamically stable for discharge to SNF today.  Following problems were addressed during her hospitalization:  Acute on chronic hypoxemic hypercarbic respiratory  failure: Stable now  Acute exacerbation of her underlying chronic respiratory disease. Appears to have h/o a smoking-related interstitial lung disease, COPD, and prior diagnosis of BOOP dating back from 2006. Severe restrictive lung disease at baseline. Likely has secondary pulmonary hypertension to underlying lung disease, on sildenafil . Patient stable post extubation. She is down to 4 L oxygen . She remains comfortable.  Continue bronchodilators and supplemented oxygen  Hypotension: Currently BP stable  Anxiety:Continue PRN clonazepam.Also on venlafaxine  Physical deconditioning:Consulted PT.Recommended SNF  Intermittent sinus tachycardia:Controlled. Continue Lopressor 12.5 mg twice daily.   Discharge Instructions:   Discharge Instructions    Activity as tolerated - No restrictions   Complete by: As directed    Diet - low sodium heart healthy   Complete by: As directed    Discharge instructions   Complete by: As directed    1) Take prescribed medications as instructed 2)Do CBC and BMP tests in a week. 3)Follow up with your pulmonologist in 2 weeks   Discharge instructions   Complete by: As directed    Per instructions, F/up with PCP, and your lung doctor in 1 wk   Increase activity slowly   Complete by: As directed    Increase activity slowly   Complete by: As directed        Medication List    STOP taking these medications  Breo Ellipta 100-25 MCG/INH Aepb Generic drug: fluticasone furoate-vilanterol     TAKE these medications   acetaminophen 325 MG tablet Commonly known as: TYLENOL Take 2 tablets (650 mg total) by mouth every 4 (four) hours as needed for mild pain (temp > 101.5).   atorvastatin 20 MG tablet Commonly known as: LIPITOR Take 20 mg by mouth at bedtime.   CALCIUM 600 PO Take 600 mg by mouth daily.   clonazePAM 2 MG tablet Commonly known as: KLONOPIN Take 0.5-1 tablets (1-2 mg total) by mouth 2 (two) times daily. Take 1mg  in am  and 2mg  in pm What changed: Another medication with the same name was removed. Continue taking this medication, and follow the directions you see here.   esomeprazole 40 MG capsule Commonly known as: NEXIUM Take 40 mg by mouth daily.   feeding supplement Liqd Take 1 Container by mouth 2 (two) times daily between meals.   fluticasone 50 MCG/ACT nasal spray Commonly known as: FLONASE Place 1 spray into both nostrils daily.   Fluticasone-Umeclidin-Vilant 100-62.5-25 MCG/INH Aepb Commonly known as: Trelegy Ellipta Inhale 1 puff into the lungs daily.   furosemide 20 MG tablet Commonly known as: LASIX Take 1 tablet (20 mg total) by mouth daily.   gabapentin 300 MG capsule Commonly known as: NEURONTIN Take 300 mg by mouth 3 (three) times daily.   ipratropium-albuterol 0.5-2.5 (3) MG/3ML Soln Commonly known as: DUONEB Take 3 mLs by nebulization 4 (four) times daily.   Melatonin 5 MG Tabs Take 5 mg by mouth at bedtime.   metoprolol tartrate 25 MG tablet Commonly known as: LOPRESSOR Take 0.5 tablets (12.5 mg total) by mouth 2 (two) times daily.   morphine CONCENTRATE 10 MG/0.5ML Soln concentrated solution Take 0.25 mLs (5 mg total) by mouth every 3 (three) hours as needed for shortness of breath.   ondansetron 4 MG tablet Commonly known as: ZOFRAN Take 4-8 mg by mouth every 8 (eight) hours as needed for nausea or vomiting.   OXYGEN Place 2-3 L/min into the nose continuous.   potassium chloride SA 20 MEQ tablet Commonly known as: KLOR-CON Take 20 mEq by mouth 2 (two) times daily.   ProAir HFA 108 (90 Base) MCG/ACT inhaler Generic drug: albuterol Inhale 2 puffs into the lungs every 6 (six) hours as needed for wheezing or shortness of breath.   sildenafil 20 MG tablet Commonly known as: REVATIO Take 20 mg by mouth 3 (three) times daily.   venlafaxine 75 MG tablet Commonly known as: EFFEXOR Take 75 mg by mouth daily.   Vitamin D-3 25 MCG (1000 UT) Caps Take 1,000  Units by mouth daily.      Follow-up Information    Hague, Rosalyn Charters, MD. Schedule an appointment as soon as possible for a visit in 1 week(s).   Specialty: Internal Medicine Contact information: [address] [place] [address] [phone]          Allergies  Allergen Reactions   Ceftriaxone Anaphylaxis   Pregabalin Other (See Comments)    Pt. Experienced joint pain, depression, confusion    Codeine Nausea Only   Itraconazole Diarrhea and Nausea Only   Sulfamethoxazole Diarrhea and Nausea Only     Procedures /Studies:   Dg Chest 1 View  Result Date: 05/20/2019 CLINICAL DATA:  ETT placement EXAM: CHEST  1 VIEW COMPARISON:  April 30, 2019 FINDINGS: The ETT is in good position. The NG tube terminates below today's film. No pneumothorax. Bilateral pulmonary infiltrates are mildly improved in  the interval. No other interval changes. IMPRESSION: ETT is in good position. Persistent but improving bilateral pulmonary infiltrates. Electronically Signed   By: Dorise Bullion III M.D   On: 05/20/2019 17:57   Ct Chest High Resolution  Result Date: 05/22/2019 CLINICAL DATA:  Interstitial lung disease with acute exacerbation. History of organizing pneumonia. EXAM: CT CHEST WITHOUT CONTRAST TECHNIQUE: Multidetector CT imaging of the chest was performed following the standard protocol without intravenous contrast. High resolution imaging of the lungs, as well as inspiratory and expiratory imaging, was performed. COMPARISON:  05/20/2019, 04/29/2019, 03/22/2019, 01/11/2018 and 11/26/2016. FINDINGS: Cardiovascular: Atherosclerotic calcification of the aorta and coronary arteries. Pulmonic trunk and heart are enlarged. There is decreased attenuation of the intravascular compartment which is indicative of anemia. No pericardial effusion. Mediastinum/Nodes: Mediastinal lymph nodes are not enlarged by CT size criteria. Hilar regions are difficult to evaluate without IV contrast. No  axillary adenopathy. A nasogastric tube is seen in the esophagus. Lungs/Pleura: Image quality is somewhat degraded by respiratory motion. Postoperative changes in the right upper lobe. There are confluent areas of parenchymal consolidation/retraction with associated traction bronchiectasis/bronchiolectasis, with a slight upper/midlung zone predominance. Associated interstitial coarsening and subpleural reticulation. No definitive honeycombing. Findings may be slightly progressive from 01/11/2018, especially in the lower lobes. Calcified granuloma in the left lower lobe. No air trapping. No pleural fluid. Endotracheal tube terminates 3 cm above the carina. Upper Abdomen: Visualized portions of the liver, adrenal glands, spleen and stomach are unremarkable with exception of a nasogastric tube partially imaged in the stomach. Musculoskeletal: No worrisome lytic or sclerotic lesions. IMPRESSION: 1. Pulmonary parenchymal pattern of fibrosis appears mildly progressive from 01/11/2018 and would be consistent with a reported history of sarcoid. Chronic hypersensitivity pneumonitis is not excluded. Findings are suggestive of an alternative diagnosis (not UIP) per consensus guidelines: Diagnosis of Idiopathic Pulmonary Fibrosis: An Official ATS/ERS/JRS/ALAT Clinical Practice Guideline. [place], Iss 5, [phone], Apr 24 2017. 2. Aortic atherosclerosis (ICD10-170.0). Coronary artery calcification. 3. Enlarged pulmonic trunk, indicative of pulmonary arterial hypertension. Electronically Signed   By: Lorin Picket M.D.   On: 05/22/2019 08:40   Dg Chest Port 1 View  Result Date: 05/21/2019 CLINICAL DATA:  Endotracheal tube. EXAM: PORTABLE CHEST 1 VIEW COMPARISON:  Radiograph of May 20, 2019. FINDINGS: Stable cardiomegaly. Endotracheal and nasogastric tubes are unchanged in position. No pneumothorax is noted. Stable bilateral opacities are noted concerning for pneumonia. Small pleural effusions  may be present. Bony thorax is unremarkable. IMPRESSION: Stable support apparatus.  Stable bilateral lung opacities. Electronically Signed   By: Marijo Conception M.D.   On: 05/21/2019 08:44     Subjective:   Patient was seen and examined 06/06/2019, 10:24 AM Patient stable today. No acute distress.  No issues overnight Stable for discharge.  Discharge Exam:    Vitals:   06/06/19 0300 06/06/19 0640 06/06/19 0816 06/06/19 0828  BP: (!) 104/54  99/61   Pulse: 81  83   Resp: 20  16   Temp: 98.5 F (36.9 C)  98.7 F (37.1 C)   TempSrc:   Oral   SpO2: 99%  100% 100%  Weight:  44.5 kg    Height:        General: Pt lying comfortably in bed & appears in no obvious distress. Cardiovascular: S1 & S2 heard, RRR, S1/S2 +. No murmurs, rubs, gallops or clicks. No JVD or pedal edema. Respiratory: with auscultation +wheezing, + rhonchi, no crackles. No increased work of breathing.  Abdominal:  Non-distended, non-tender & soft. No organomegaly or masses appreciated. Normal bowel sounds heard. CNS: Alert and oriented. No focal deficits. Extremities: no edema, no cyanosis    The results of significant diagnostics from this hospitalization (including imaging, microbiology, ancillary and laboratory) are listed below for reference.      Microbiology:   Recent Results (from the past 240 hour(s))  SARS CORONAVIRUS 2 (TAT 6-24 HRS) Nasopharyngeal Nasopharyngeal Swab     Status: None   Collection Time: 05/29/19  2:03 PM   Specimen: Nasopharyngeal Swab  Result Value Ref Range Status   SARS Coronavirus 2 NEGATIVE NEGATIVE Final    Comment: (NOTE) SARS-CoV-2 target nucleic acids are NOT DETECTED. The SARS-CoV-2 RNA is generally detectable in upper and lower respiratory specimens during the acute phase of infection. Negative results do not preclude SARS-CoV-2 infection, do not rule out co-infections with other pathogens, and should not be used as the sole basis for treatment or other patient  management decisions. Negative results must be combined with clinical observations, patient history, and epidemiological information. The expected result is Negative. Fact Sheet for Patients: SugarRoll.be Fact Sheet for Healthcare Providers: https://www.woods-mathews.com/ This test is not yet approved or cleared by the [place] FDA and  has been authorized for detection and/or diagnosis of SARS-CoV-2 by FDA under an Emergency Use Authorization (EUA). This EUA will remain  in effect (meaning this test can be used) for the duration of the COVID-19 declaration under Section 56 4(b)(1) of the Act, 21 U.S.C. section 360bbb-3(b)(1), unless the authorization is terminated or revoked sooner. Performed at [facility], [place] [address], [place], [place] [address]   SARS CORONAVIRUS 2 (TAT 6-24 HRS) Nasopharyngeal Nasopharyngeal Swab     Status: None   Collection Time: 06/05/19  1:24 PM   Specimen: Nasopharyngeal Swab  Result Value Ref Range Status   SARS Coronavirus 2 NEGATIVE NEGATIVE Final    Comment: (NOTE) SARS-CoV-2 target nucleic acids are NOT DETECTED. The SARS-CoV-2 RNA is generally detectable in upper and lower respiratory specimens during the acute phase of infection. Negative results do not preclude SARS-CoV-2 infection, do not rule out co-infections with other pathogens, and should not be used as the sole basis for treatment or other patient management decisions. Negative results must be combined with clinical observations, patient history, and epidemiological information. The expected result is Negative. Fact Sheet for Patients: SugarRoll.be Fact Sheet for Healthcare Providers: https://www.woods-mathews.com/ This test is not yet approved or cleared by the [place] FDA and  has been authorized for detection and/or diagnosis of SARS-CoV-2 by FDA under an Emergency Use Authorization  (EUA). This EUA will remain  in effect (meaning this test can be used) for the duration of the COVID-19 declaration under Section 56 4(b)(1) of the Act, 21 U.S.C. section 360bbb-3(b)(1), unless the authorization is terminated or revoked sooner. Performed at [facility], [place] [address] [place],  [address]      Labs:   BNP (last 3 results) Recent Labs    03/23/19 1747  BNP 431.3*   Cardiac Enzymes: No results for input(s): CKTOTAL, CKMB, CKMBINDEX, TROPONINI in the last 168 hours. CBG: Recent Labs  Lab 06/05/19 1224 06/05/19 1624 06/05/19 2308 06/06/19 0301 06/06/19 0813  GLUCAP 71 122* 128* 99 97   Urinalysis    Component Value Date/Time   COLORURINE YELLOW 03/23/2019 1810   APPEARANCEUR CLOUDY (A) 03/23/2019 1810   LABSPEC 1.017 03/23/2019 1810   PHURINE 5.0 03/23/2019 1810   GLUCOSEU NEGATIVE  03/23/2019 1810   [place] 03/23/2019 1810   BILIRUBINUR NEGATIVE 03/23/2019 1810   KETONESUR NEGATIVE 03/23/2019 1810   PROTEINUR 30 (A) 03/23/2019 1810   NITRITE NEGATIVE 03/23/2019 1810   LEUKOCYTESUR NEGATIVE 03/23/2019 1810    Time coordinating discharge: 45 minutes  SIGNED: Deatra James, MD, FACP, FHM. [facility],  Pager [phone]  If 7PM-7AM, please contact night-coverage [email or web address], Password [facility] 06/06/2019, 10:24 AM

## 2019-06-07 LAB — GLUCOSE, CAPILLARY
Glucose-Capillary: 108 mg/dL — ABNORMAL HIGH (ref 70–99)
Glucose-Capillary: 108 mg/dL — ABNORMAL HIGH (ref 70–99)
Glucose-Capillary: 106 mg/dL — ABNORMAL HIGH (ref 70–99)
Glucose-Capillary: 111 mg/dL — ABNORMAL HIGH (ref 70–99)
Glucose-Capillary: 172 mg/dL — ABNORMAL HIGH (ref 70–99)
Glucose-Capillary: 130 mg/dL — ABNORMAL HIGH (ref 70–99)

## 2019-06-07 NOTE — Care Management Important Message (Signed)
Important Message  Patient Details  Name: Krystal Cantu MRN: [id number] Date of Birth: January 26, 1961   Medicare Important Message Given:  Yes     Branden Shallenberger 06/07/2019, 9:29 AM

## 2019-06-07 NOTE — Progress Notes (Signed)
Upon RT arrival, pt sitting on side of bed, obvious respiratory distress noted. Pt just back in bed from using bedside commode, and was very SOB. SpO2 87% on 3lnc, HR 102. O2 increased to 4L for comfort, and neb tx given on air. Post neb tx, pt states she is feeling better, SpO2 96%, HR 94. O2 weaned back to 3L. RT will continue to monitor.

## 2019-06-07 NOTE — TOC Progression Note (Signed)
Transition of Care [facility]) - Progression Note    Patient Details  Name: Krystal Cantu MRN: [id number] Date of Birth: [birth year]  Transition of Care [facility]) CM/SW [place], [place] Phone Number: 06/07/2019, 10:25 AM  Clinical Narrative:   CSW received call from [place] that [facility] is now requesting MD documentation that patient is stable for discharge, as well as the initial PT/OT evaluations. CSW sent discharge summary and initial PT/OT evaluations to [facility] to send to [facility].   Patient continues to await insurance authorization.    Expected Discharge Plan: Skilled Nursing Facility Barriers to Discharge: Insurance Authorization  Expected Discharge Plan and Services Expected Discharge Plan: [place] In-house Referral: Clinical Social Work, Hospice / Palliative Care Discharge Planning Services: CM Consult Post Acute Care Choice: [place] arrangements for the past 2 months: Single Family Home Expected Discharge Date: 06/07/19                                     Social Determinants of Health (SDOH) Interventions    Readmission Risk Interventions No flowsheet data found.

## 2019-06-07 NOTE — TOC Progression Note (Signed)
Transition of Care [facility]) - Progression Note    Patient Details  Name: Krystal Cantu MRN: [id number] Date of Birth: 03/17/1961  Transition of Care [facility]) CM/SW [place],  Phone Number: 06/07/2019, 4:16 PM  Clinical Narrative:   CSW has still not received authorization for patient to admit to SNF. CSW printed out new authorization request and sent to [facility] for review, to attempt to see if sending an additional request will get them to process the request for SNF as the original request has now been in process since 10/5. CSW sent in with the fax the discharge summaries from three MDs that are in the chart that the patient is stable for discharge. CSW will await call back for authorization request.    Expected Discharge Plan: Skilled Nursing Facility Barriers to Discharge: Insurance Authorization  Expected Discharge Plan and Services Expected Discharge Plan: [place] In-house Referral: Clinical Social Work, Hospice / Palliative Care Discharge Planning Services: CM Consult Post Acute Care Choice: [place] arrangements for the past 2 months: Single Family Home Expected Discharge Date: 06/07/19                                     Social Determinants of Health (SDOH) Interventions    Readmission Risk Interventions No flowsheet data found.

## 2019-06-07 NOTE — Discharge Summary (Signed)
Discharge Summary  Krystal Cantu [id number] DOB: 1961-05-15  PCP: Bonnita Nasuti, MD  Admit date: 05/20/2019 Discharge date: 06/07/2019  Time spent: 25 minutes  Recommendations for Outpatient Follow-up:     Follow up: in 1 week  Discharge Condition: Stable   Code Status:   Code Status: DNR  Diet recommendation: Cardiac diet  Discharge Diagnoses:  Active Hospital Problems   Diagnosis Date Noted  . Anxiety   . Malnutrition of moderate degree 05/23/2019  . Palliative care by specialist   . DNR (do not resuscitate) discussion   . DNR (do not resuscitate)   . Weakness generalized   . COPD exacerbation ([place]) 05/20/2019  . Endotracheally intubated   . Dyspnea 02/27/2009    Resolved Hospital Problems  No resolved problems to display.      Vitals:   06/06/19 2309 06/07/19 0741  BP: (!) 90/56 103/67  Pulse: 90 83  Resp: 20 18  Temp: 98 F (36.7 C) 98.5 F (36.9 C)  SpO2: 99% 96%    History of present illness:  NO changes please see D/C summary below Dr. Tawanna Solo below  Brief/Interim Summary:  Patient iis a58 year old female patient with a complicated pulmonary history as mentioned below followed by Dr. Melvyn Novas. Last hospitalized in August 2020 for acute on chronic respiratory failure.Presented to the emergency room on 05/20/2019 via EMS after being found at home unresponsive on. Apparently had been in normal state of health the night prior. She remained unresponsive, did not improve with BiPAP so was placed on mechanical ventilator and subsequently transferred to [facility] for further evaluation.  She was extubated on 05/23/2019 and was subsequently transferred under [facility] care on 05/25/2019. She was also seen by palliative care and was made DNR.Currently her respiratory status is back to baseline.PCCM has signed off. She was seen by PT/OT and recommended skilled facility on discharge.    06/07/19: Patient was seen and examined at her bedside this  morning.  No acute events overnight.  No changes.  She denies any chest pain or dyspnea at rest.  She has no new complaints.  Breathing comfortably on supplemental oxygen.  On 3 L nasal cannula with O2 saturation between 96 and 99%.  She is hemodynamically stable for discharge to SNF.   Hospital Course:  Active Problems:   Dyspnea   COPD exacerbation ([facility])   Endotracheally intubated   Malnutrition of moderate degree   Palliative care by specialist   DNR (do not resuscitate) discussion   DNR (do not resuscitate)   Weakness generalized   Anxiety  Following problems were addressed duringherhospitalization:  Acute on chronic hypoxemic hypercarbic respiratory failure: Stable now  Acute exacerbation of her underlying chronic respiratory disease. Appears to have h/o a smoking-related interstitial lung disease, COPD, and prior diagnosis of BOOP dating back from 2006. Severe restrictive lung disease at baseline. Likelyhassecondary pulmonary hypertension to underlying lung disease, on sildenafil. Patient stable post extubation. She is down to 4 L oxygen . She remains comfortable. Continue bronchodilators and supplemented oxygen  Hypotension: Currently BP stable  Anxiety:Continue PRNclonazepam.Also on venlafaxine  Physical deconditioning:Consulted PT.Recommended SNF  Intermittent sinus tachycardia:Controlled. Continue Lopressor 12.5 mg twice daily.   Discharge Exam: BP 103/67   Pulse 83   Temp 98.5 F (36.9 C) (Oral)   Resp 18   Ht 5\' 4"  (1.626 m)   Wt 44.5 kg   SpO2 96%   BMI 16.84 kg/m  . General: 58 y.o. year-old female well developed well nourished in no acute distress.  Alert and oriented x3. . Cardiovascular: Regular rate and rhythm with no rubs or gallops.  No thyromegaly or JVD noted.   Marland Kitchen Respiratory: Mild rales at bases with no wheezing noted.  Good inspiratory effort.   . Abdomen: Soft nontender nondistended with normal bowel sounds x4 quadrants. .  Musculoskeletal: No lower extremity edema. 2/4 pulses in all 4 extremities. Marland Kitchen Psychiatry: Mood is appropriate for condition and setting  Discharge Instructions You were cared for by a hospitalist during your hospital stay. If you have any questions about your discharge medications or the care you received while you were in the hospital after you are discharged, you can call the unit and asked to speak with the hospitalist on call if the hospitalist that took care of you is not available. Once you are discharged, your primary care physician will handle any further medical issues. Please note that NO REFILLS for any discharge medications will be authorized once you are discharged, as it is imperative that you return to your primary care physician (or establish a relationship with a primary care physician if you do not have one) for your aftercare needs so that they can reassess your need for medications and monitor your lab values.  Discharge Instructions    Activity as tolerated - No restrictions   Complete by: As directed    Diet - low sodium heart healthy   Complete by: As directed    Discharge instructions   Complete by: As directed    1) Take prescribed medications as instructed 2)Do CBC and BMP tests in a week. 3)Follow up with your pulmonologist in 2 weeks   Discharge instructions   Complete by: As directed    Per instructions, F/up with PCP, and your lung doctor in 1 wk   Increase activity slowly   Complete by: As directed    Increase activity slowly   Complete by: As directed      Allergies as of 06/07/2019      Reactions   Ceftriaxone Anaphylaxis   Pregabalin Other (See Comments)   Pt. Experienced joint pain, depression, confusion   Codeine Nausea Only   Itraconazole Diarrhea, Nausea Only   Sulfamethoxazole Diarrhea, Nausea Only      Medication List    STOP taking these medications   Breo Ellipta 100-25 MCG/INH Aepb Generic drug: fluticasone furoate-vilanterol      TAKE these medications   acetaminophen 325 MG tablet Commonly known as: TYLENOL Take 2 tablets (650 mg total) by mouth every 4 (four) hours as needed for mild pain (temp > 101.5).   atorvastatin 20 MG tablet Commonly known as: LIPITOR Take 20 mg by mouth at bedtime.   CALCIUM 600 PO Take 600 mg by mouth daily.   clonazePAM 2 MG tablet Commonly known as: KLONOPIN Take 0.5-1 tablets (1-2 mg total) by mouth 2 (two) times daily. Take 1mg  in am and 2mg  in pm What changed: Another medication with the same name was removed. Continue taking this medication, and follow the directions you see here.   esomeprazole 40 MG capsule Commonly known as: NEXIUM Take 40 mg by mouth daily.   feeding supplement Liqd Take 1 Container by mouth 2 (two) times daily between meals.   fluticasone 50 MCG/ACT nasal spray Commonly known as: FLONASE Place 1 spray into both nostrils daily.   Fluticasone-Umeclidin-Vilant 100-62.5-25 MCG/INH Aepb Commonly known as: Trelegy Ellipta Inhale 1 puff into the lungs daily.   furosemide 20 MG tablet Commonly known as: LASIX Take 1 tablet (  20 mg total) by mouth daily.   gabapentin 300 MG capsule Commonly known as: NEURONTIN Take 300 mg by mouth 3 (three) times daily.   ipratropium-albuterol 0.5-2.5 (3) MG/3ML Soln Commonly known as: DUONEB Take 3 mLs by nebulization 4 (four) times daily.   Melatonin 5 MG Tabs Take 5 mg by mouth at bedtime.   metoprolol tartrate 25 MG tablet Commonly known as: LOPRESSOR Take 0.5 tablets (12.5 mg total) by mouth 2 (two) times daily.   morphine CONCENTRATE 10 MG/0.5ML Soln concentrated solution Take 0.25 mLs (5 mg total) by mouth every 3 (three) hours as needed for shortness of breath.   ondansetron 4 MG tablet Commonly known as: ZOFRAN Take 4-8 mg by mouth every 8 (eight) hours as needed for nausea or vomiting.   OXYGEN Place 2-3 L/min into the nose continuous.   potassium chloride SA 20 MEQ tablet Commonly known as:  KLOR-CON Take 20 mEq by mouth 2 (two) times daily.   ProAir HFA 108 (90 Base) MCG/ACT inhaler Generic drug: albuterol Inhale 2 puffs into the lungs every 6 (six) hours as needed for wheezing or shortness of breath.   sildenafil 20 MG tablet Commonly known as: REVATIO Take 20 mg by mouth 3 (three) times daily.   venlafaxine 75 MG tablet Commonly known as: EFFEXOR Take 75 mg by mouth daily.   Vitamin D-3 25 MCG (1000 UT) Caps Take 1,000 Units by mouth daily.      Allergies  Allergen Reactions  . Ceftriaxone Anaphylaxis  . Pregabalin Other (See Comments)    Pt. Experienced joint pain, depression, confusion   . Codeine Nausea Only  . Itraconazole Diarrhea and Nausea Only  . Sulfamethoxazole Diarrhea and Nausea Only   Follow-up Information    Hague, Rosalyn Charters, MD. Schedule an appointment as soon as possible for a visit in 1 week(s).   Specialty: Internal Medicine Contact information: [address] [place] [address] [phone]            The results of significant diagnostics from this hospitalization (including imaging, microbiology, ancillary and laboratory) are listed below for reference.    Significant Diagnostic Studies: Dg Chest 1 View  Result Date: 05/20/2019 CLINICAL DATA:  ETT placement EXAM: CHEST  1 VIEW COMPARISON:  April 30, 2019 FINDINGS: The ETT is in good position. The NG tube terminates below today's film. No pneumothorax. Bilateral pulmonary infiltrates are mildly improved in the interval. No other interval changes. IMPRESSION: ETT is in good position. Persistent but improving bilateral pulmonary infiltrates. Electronically Signed   By: Dorise Bullion III M.D   On: 05/20/2019 17:57   Ct Chest High Resolution  Result Date: 05/22/2019 CLINICAL DATA:  Interstitial lung disease with acute exacerbation. History of organizing pneumonia. EXAM: CT CHEST WITHOUT CONTRAST TECHNIQUE: Multidetector CT imaging of the chest was performed following  the standard protocol without intravenous contrast. High resolution imaging of the lungs, as well as inspiratory and expiratory imaging, was performed. COMPARISON:  05/20/2019, 04/29/2019, 03/22/2019, 01/11/2018 and 11/26/2016. FINDINGS: Cardiovascular: Atherosclerotic calcification of the aorta and coronary arteries. Pulmonic trunk and heart are enlarged. There is decreased attenuation of the intravascular compartment which is indicative of anemia. No pericardial effusion. Mediastinum/Nodes: Mediastinal lymph nodes are not enlarged by CT size criteria. Hilar regions are difficult to evaluate without IV contrast. No axillary adenopathy. A nasogastric tube is seen in the esophagus. Lungs/Pleura: Image quality is somewhat degraded by respiratory motion. Postoperative changes in the right upper lobe. There are confluent areas of parenchymal  consolidation/retraction with associated traction bronchiectasis/bronchiolectasis, with a slight upper/midlung zone predominance. Associated interstitial coarsening and subpleural reticulation. No definitive honeycombing. Findings may be slightly progressive from 01/11/2018, especially in the lower lobes. Calcified granuloma in the left lower lobe. No air trapping. No pleural fluid. Endotracheal tube terminates 3 cm above the carina. Upper Abdomen: Visualized portions of the liver, adrenal glands, spleen and stomach are unremarkable with exception of a nasogastric tube partially imaged in the stomach. Musculoskeletal: No worrisome lytic or sclerotic lesions. IMPRESSION: 1. Pulmonary parenchymal pattern of fibrosis appears mildly progressive from 01/11/2018 and would be consistent with a reported history of sarcoid. Chronic hypersensitivity pneumonitis is not excluded. Findings are suggestive of an alternative diagnosis (not UIP) per consensus guidelines: Diagnosis of Idiopathic Pulmonary Fibrosis: An Official ATS/ERS/JRS/ALAT Clinical Practice Guideline. Long, Iss 5, [phone], Apr 24 2017. 2. Aortic atherosclerosis (ICD10-170.0). Coronary artery calcification. 3. Enlarged pulmonic trunk, indicative of pulmonary arterial hypertension. Electronically Signed   By: Lorin Picket M.D.   On: 05/22/2019 08:40   Dg Chest Port 1 View  Result Date: 05/21/2019 CLINICAL DATA:  Endotracheal tube. EXAM: PORTABLE CHEST 1 VIEW COMPARISON:  Radiograph of May 20, 2019. FINDINGS: Stable cardiomegaly. Endotracheal and nasogastric tubes are unchanged in position. No pneumothorax is noted. Stable bilateral opacities are noted concerning for pneumonia. Small pleural effusions may be present. Bony thorax is unremarkable. IMPRESSION: Stable support apparatus.  Stable bilateral lung opacities. Electronically Signed   By: Marijo Conception M.D.   On: 05/21/2019 08:44    Microbiology: Recent Results (from the past 240 hour(s))  SARS CORONAVIRUS 2 (TAT 6-24 HRS) Nasopharyngeal Nasopharyngeal Swab     Status: None   Collection Time: 05/29/19  2:03 PM   Specimen: Nasopharyngeal Swab  Result Value Ref Range Status   SARS Coronavirus 2 NEGATIVE NEGATIVE Final    Comment: (NOTE) SARS-CoV-2 target nucleic acids are NOT DETECTED. The SARS-CoV-2 RNA is generally detectable in upper and lower respiratory specimens during the acute phase of infection. Negative results do not preclude SARS-CoV-2 infection, do not rule out co-infections with other pathogens, and should not be used as the sole basis for treatment or other patient management decisions. Negative results must be combined with clinical observations, patient history, and epidemiological information. The expected result is Negative. Fact Sheet for Patients: SugarRoll.be Fact Sheet for Healthcare Providers: https://www.woods-mathews.com/ This test is not yet approved or cleared by the [place] FDA and  has been authorized for detection and/or diagnosis of SARS-CoV-2  by FDA under an Emergency Use Authorization (EUA). This EUA will remain  in effect (meaning this test can be used) for the duration of the COVID-19 declaration under Section 56 4(b)(1) of the Act, 21 U.S.C. section 360bbb-3(b)(1), unless the authorization is terminated or revoked sooner. Performed at [facility], [place] [address] [place], [place] [address]   SARS CORONAVIRUS 2 (TAT 6-24 HRS) Nasopharyngeal Nasopharyngeal Swab     Status: None   Collection Time: 06/05/19  1:24 PM   Specimen: Nasopharyngeal Swab  Result Value Ref Range Status   SARS Coronavirus 2 NEGATIVE NEGATIVE Final    Comment: (NOTE) SARS-CoV-2 target nucleic acids are NOT DETECTED. The SARS-CoV-2 RNA is generally detectable in upper and lower respiratory specimens during the acute phase of infection. Negative results do not preclude SARS-CoV-2 infection, do not rule out co-infections with other pathogens, and should not be used as the sole basis for treatment or other patient management decisions.  Negative results must be combined with clinical observations, patient history, and epidemiological information. The expected result is Negative. Fact Sheet for Patients: SugarRoll.be Fact Sheet for Healthcare Providers: https://www.woods-mathews.com/ This test is not yet approved or cleared by the [place] FDA and  has been authorized for detection and/or diagnosis of SARS-CoV-2 by FDA under an Emergency Use Authorization (EUA). This EUA will remain  in effect (meaning this test can be used) for the duration of the COVID-19 declaration under Section 56 4(b)(1) of the Act, 21 U.S.C. section 360bbb-3(b)(1), unless the authorization is terminated or revoked sooner. Performed at [facility], [place] [address], [place], [place] [address]      Labs: Basic Metabolic Panel: No results for input(s): NA, K, CL, CO2, GLUCOSE, BUN, CREATININE, CALCIUM, MG, PHOS in  the last 168 hours. Liver Function Tests: No results for input(s): AST, ALT, ALKPHOS, BILITOT, PROT, ALBUMIN in the last 168 hours. No results for input(s): LIPASE, AMYLASE in the last 168 hours. No results for input(s): AMMONIA in the last 168 hours. CBC: No results for input(s): WBC, NEUTROABS, HGB, HCT, MCV, PLT in the last 168 hours. Cardiac Enzymes: No results for input(s): CKTOTAL, CKMB, CKMBINDEX, TROPONINI in the last 168 hours. BNP: BNP (last 3 results) Recent Labs    03/23/19 1747  BNP 431.3*    ProBNP (last 3 results) No results for input(s): PROBNP in the last 8760 hours.  CBG: Recent Labs  Lab 06/06/19 1553 06/06/19 1916 06/06/19 2308 06/07/19 0244 06/07/19 0731  GLUCAP 116* 108* 127* 108* 108*       Signed:  Kayleen Memos, MD [facility] 06/07/2019, 9:18 AM

## 2019-06-08 LAB — GLUCOSE, CAPILLARY
Glucose-Capillary: 104 mg/dL — ABNORMAL HIGH (ref 70–99)
Glucose-Capillary: 113 mg/dL — ABNORMAL HIGH (ref 70–99)

## 2019-06-08 NOTE — TOC Transition Note (Signed)
Transition of Care [facility]) - CM/SW Discharge Note   Patient Details  Name: Krystal Cantu MRN: [id number] Date of Birth: [birth year]  [facility]) CM/SW Contact:  Geralynn Ochs, LCSW Phone Number: 06/08/2019, 10:15 AM   Clinical Narrative:   Nurse to call report to [phone], [phone]. Patient going to room 608.    Final next level of care: Skilled Nursing Facility Barriers to Discharge: Barriers Resolved   Patient Goals and CMS Choice Patient states their goals for this hospitalization and ongoing recovery are:: to be close to home/get stronger if possible CMS Medicare.gov Compare Post Acute Care list provided to:: Patient Choice offered to / list presented to : Patient  Discharge Placement              Patient chooses bed at: [facility] Patient to be transferred to facility by: [facility] Name of family member notified: Self Patient and family notified of of transfer: 06/08/19  Discharge Plan and Services In-house Referral: Clinical Social Work, Hospice / Palliative Care Discharge Planning Services: CM Consult Post Acute Care Choice: [facility]                               Social Determinants of Health (SDOH) Interventions     Readmission Risk Interventions No flowsheet data found.

## 2019-06-08 NOTE — Discharge Summary (Signed)
Discharge Summary  Krystal Cantu [id number] DOB: 02/20/1961  PCP: Bonnita Nasuti, MD  Admit date: 05/20/2019 Discharge date: 06/08/2019  Time spent: 25 minutes  Recommendations for Outpatient Follow-up:     Follow up: in 1 week  Discharge Condition: Stable   Code Status:   Code Status: DNR  Diet recommendation: Cardiac diet  Discharge Diagnoses:  Active Hospital Problems   Diagnosis Date Noted  . Anxiety   . Malnutrition of moderate degree 05/23/2019  . Palliative care by specialist   . DNR (do not resuscitate) discussion   . DNR (do not resuscitate)   . Weakness generalized   . COPD exacerbation ([place]) 05/20/2019  . Endotracheally intubated   . Dyspnea 02/27/2009    Resolved Hospital Problems  No resolved problems to display.      Vitals:   06/07/19 2305 06/08/19 0729  BP: 105/71   Pulse: 87   Resp: 20   Temp: 98.6 F (37 C)   SpO2: 94% 94%    History of present illness:  NO changes please see D/C summary below Dr. Tawanna Solo below  Brief/Interim Summary:  Patient iis a58 year old female patient with a complicated pulmonary history as mentioned below followed by Dr. Melvyn Novas. Last hospitalized in August 2020 for acute on chronic respiratory failure.Presented to the emergency room on 05/20/2019 via EMS after being found at home unresponsive on. Apparently had been in normal state of health the night prior. She remained unresponsive, did not improve with BiPAP so was placed on mechanical ventilator and subsequently transferred to [facility] for further evaluation.  She was extubated on 05/23/2019 and was subsequently transferred under [facility] care on 05/25/2019. She was also seen by palliative care and was made DNR.Currently her respiratory status is back to baseline.PCCM has signed off. She was seen by PT/OT and recommended skilled facility on discharge.   06/08/19: Patient seen and examined at bedside this morning.  Dyspnea with exertion improved with  rest, breathing treatment, and oxygen supplementation.  Denies chest pain.  Plan is to discharge to SNF.  Discharge delayed due to insurance authorization.   Hospital Course:  Active Problems:   Dyspnea   COPD exacerbation (HCC)   Endotracheally intubated   Malnutrition of moderate degree   Palliative care by specialist   DNR (do not resuscitate) discussion   DNR (do not resuscitate)   Weakness generalized   Anxiety  Following problems were addressed duringherhospitalization:  Acute on chronic hypoxemic hypercarbic respiratory failure: Stable now  Acute exacerbation of her underlying chronic respiratory disease. Appears to have h/o a smoking-related interstitial lung disease, COPD, and prior diagnosis of BOOP dating back from 2006. Severe restrictive lung disease at baseline. Likelyhassecondary pulmonary hypertension to underlying lung disease, on sildenafil. Patient stable post extubation. She is down to 3 L oxygen. Continue bronchodilators and supplemented oxygen  Hypotension: Currently BP stable  Anxiety:Continue PRNclonazepam.Also on venlafaxine  Physical deconditioning:Consulted PT. Recommended SNF  Intermittent sinus tachycardia:Controlled. Continue Lopressor 12.5 mg twice daily.   Discharge Exam: BP 105/71 (BP Location: Right Arm)   Pulse 87   Temp 98.6 F (37 C)   Resp 20   Ht 5\' 4"  (1.626 m)   Wt 48 kg   SpO2 94%   BMI 18.16 kg/m  . General: 58 y.o. year-old female well-developed well-nourished no acute distress.  Alert and oriented x4.   . Cardiovascular: Regular rate and rhythm no rubs or gallops no JVD or thyromegaly noted. Marland Kitchen Respiratory: Mild rales at bases no wheezing noted.  Good respiratory effort. . Abdomen: Soft nontender nondistended normal bowel sounds present. . Musculoskeletal: No lower extremity edema 2 out of 4 pulses in all 4 extremities.   Marland Kitchen Psychiatry: Mood is appropriate for condition and setting.   Discharge  Instructions You were cared for by a hospitalist during your hospital stay. If you have any questions about your discharge medications or the care you received while you were in the hospital after you are discharged, you can call the unit and asked to speak with the hospitalist on call if the hospitalist that took care of you is not available. Once you are discharged, your primary care physician will handle any further medical issues. Please note that NO REFILLS for any discharge medications will be authorized once you are discharged, as it is imperative that you return to your primary care physician (or establish a relationship with a primary care physician if you do not have one) for your aftercare needs so that they can reassess your need for medications and monitor your lab values.  Discharge Instructions    Activity as tolerated - No restrictions   Complete by: As directed    Diet - low sodium heart healthy   Complete by: As directed    Discharge instructions   Complete by: As directed    1) Take prescribed medications as instructed 2)Do CBC and BMP tests in a week. 3)Follow up with your pulmonologist in 2 weeks   Discharge instructions   Complete by: As directed    Per instructions, F/up with PCP, and your lung doctor in 1 wk   Increase activity slowly   Complete by: As directed    Increase activity slowly   Complete by: As directed      Allergies as of 06/08/2019      Reactions   Ceftriaxone Anaphylaxis   Pregabalin Other (See Comments)   Pt. Experienced joint pain, depression, confusion   Codeine Nausea Only   Itraconazole Diarrhea, Nausea Only   Sulfamethoxazole Diarrhea, Nausea Only      Medication List    STOP taking these medications   Breo Ellipta 100-25 MCG/INH Aepb Generic drug: fluticasone furoate-vilanterol     TAKE these medications   acetaminophen 325 MG tablet Commonly known as: TYLENOL Take 2 tablets (650 mg total) by mouth every 4 (four) hours as needed  for mild pain (temp > 101.5).   atorvastatin 20 MG tablet Commonly known as: LIPITOR Take 20 mg by mouth at bedtime.   CALCIUM 600 PO Take 600 mg by mouth daily.   clonazePAM 2 MG tablet Commonly known as: KLONOPIN Take 0.5-1 tablets (1-2 mg total) by mouth 2 (two) times daily. Take 1mg  in am and 2mg  in pm What changed: Another medication with the same name was removed. Continue taking this medication, and follow the directions you see here.   esomeprazole 40 MG capsule Commonly known as: NEXIUM Take 40 mg by mouth daily.   feeding supplement Liqd Take 1 Container by mouth 2 (two) times daily between meals.   fluticasone 50 MCG/ACT nasal spray Commonly known as: FLONASE Place 1 spray into both nostrils daily.   Fluticasone-Umeclidin-Vilant 100-62.5-25 MCG/INH Aepb Commonly known as: Trelegy Ellipta Inhale 1 puff into the lungs daily.   furosemide 20 MG tablet Commonly known as: LASIX Take 1 tablet (20 mg total) by mouth daily.   gabapentin 300 MG capsule Commonly known as: NEURONTIN Take 300 mg by mouth 3 (three) times daily.   ipratropium-albuterol 0.5-2.5 (3) MG/3ML Soln  Commonly known as: DUONEB Take 3 mLs by nebulization 4 (four) times daily.   Melatonin 5 MG Tabs Take 5 mg by mouth at bedtime.   metoprolol tartrate 25 MG tablet Commonly known as: LOPRESSOR Take 0.5 tablets (12.5 mg total) by mouth 2 (two) times daily.   morphine CONCENTRATE 10 MG/0.5ML Soln concentrated solution Take 0.25 mLs (5 mg total) by mouth every 3 (three) hours as needed for shortness of breath.   ondansetron 4 MG tablet Commonly known as: ZOFRAN Take 4-8 mg by mouth every 8 (eight) hours as needed for nausea or vomiting.   OXYGEN Place 2-3 L/min into the nose continuous.   potassium chloride SA 20 MEQ tablet Commonly known as: KLOR-CON Take 20 mEq by mouth 2 (two) times daily.   ProAir HFA 108 (90 Base) MCG/ACT inhaler Generic drug: albuterol Inhale 2 puffs into the lungs  every 6 (six) hours as needed for wheezing or shortness of breath.   sildenafil 20 MG tablet Commonly known as: REVATIO Take 20 mg by mouth 3 (three) times daily.   venlafaxine 75 MG tablet Commonly known as: EFFEXOR Take 75 mg by mouth daily.   Vitamin D-3 25 MCG (1000 UT) Caps Take 1,000 Units by mouth daily.      Allergies  Allergen Reactions  . Ceftriaxone Anaphylaxis  . Pregabalin Other (See Comments)    Pt. Experienced joint pain, depression, confusion   . Codeine Nausea Only  . Itraconazole Diarrhea and Nausea Only  . Sulfamethoxazole Diarrhea and Nausea Only   Follow-up Information    Hague, Rosalyn Charters, MD. Schedule an appointment as soon as possible for a visit in 1 week(s).   Specialty: Internal Medicine Contact information: [address] [place] [address] [phone]            The results of significant diagnostics from this hospitalization (including imaging, microbiology, ancillary and laboratory) are listed below for reference.    Significant Diagnostic Studies: Dg Chest 1 View  Result Date: 05/20/2019 CLINICAL DATA:  ETT placement EXAM: CHEST  1 VIEW COMPARISON:  April 30, 2019 FINDINGS: The ETT is in good position. The NG tube terminates below today's film. No pneumothorax. Bilateral pulmonary infiltrates are mildly improved in the interval. No other interval changes. IMPRESSION: ETT is in good position. Persistent but improving bilateral pulmonary infiltrates. Electronically Signed   By: Dorise Bullion III M.D   On: 05/20/2019 17:57   Ct Chest High Resolution  Result Date: 05/22/2019 CLINICAL DATA:  Interstitial lung disease with acute exacerbation. History of organizing pneumonia. EXAM: CT CHEST WITHOUT CONTRAST TECHNIQUE: Multidetector CT imaging of the chest was performed following the standard protocol without intravenous contrast. High resolution imaging of the lungs, as well as inspiratory and expiratory imaging, was performed.  COMPARISON:  05/20/2019, 04/29/2019, 03/22/2019, 01/11/2018 and 11/26/2016. FINDINGS: Cardiovascular: Atherosclerotic calcification of the aorta and coronary arteries. Pulmonic trunk and heart are enlarged. There is decreased attenuation of the intravascular compartment which is indicative of anemia. No pericardial effusion. Mediastinum/Nodes: Mediastinal lymph nodes are not enlarged by CT size criteria. Hilar regions are difficult to evaluate without IV contrast. No axillary adenopathy. A nasogastric tube is seen in the esophagus. Lungs/Pleura: Image quality is somewhat degraded by respiratory motion. Postoperative changes in the right upper lobe. There are confluent areas of parenchymal consolidation/retraction with associated traction bronchiectasis/bronchiolectasis, with a slight upper/midlung zone predominance. Associated interstitial coarsening and subpleural reticulation. No definitive honeycombing. Findings may be slightly progressive from 01/11/2018, especially in the lower lobes.  Calcified granuloma in the left lower lobe. No air trapping. No pleural fluid. Endotracheal tube terminates 3 cm above the carina. Upper Abdomen: Visualized portions of the liver, adrenal glands, spleen and stomach are unremarkable with exception of a nasogastric tube partially imaged in the stomach. Musculoskeletal: No worrisome lytic or sclerotic lesions. IMPRESSION: 1. Pulmonary parenchymal pattern of fibrosis appears mildly progressive from 01/11/2018 and would be consistent with a reported history of sarcoid. Chronic hypersensitivity pneumonitis is not excluded. Findings are suggestive of an alternative diagnosis (not UIP) per consensus guidelines: Diagnosis of Idiopathic Pulmonary Fibrosis: An Official ATS/ERS/JRS/ALAT Clinical Practice Guideline. [place], Iss 5, [phone], Apr 24 2017. 2. Aortic atherosclerosis (ICD10-170.0). Coronary artery calcification. 3. Enlarged pulmonic trunk, indicative of  pulmonary arterial hypertension. Electronically Signed   By: Lorin Picket M.D.   On: 05/22/2019 08:40   Dg Chest Port 1 View  Result Date: 05/21/2019 CLINICAL DATA:  Endotracheal tube. EXAM: PORTABLE CHEST 1 VIEW COMPARISON:  Radiograph of May 20, 2019. FINDINGS: Stable cardiomegaly. Endotracheal and nasogastric tubes are unchanged in position. No pneumothorax is noted. Stable bilateral opacities are noted concerning for pneumonia. Small pleural effusions may be present. Bony thorax is unremarkable. IMPRESSION: Stable support apparatus.  Stable bilateral lung opacities. Electronically Signed   By: Marijo Conception M.D.   On: 05/21/2019 08:44    Microbiology: Recent Results (from the past 240 hour(s))  SARS CORONAVIRUS 2 (TAT 6-24 HRS) Nasopharyngeal Nasopharyngeal Swab     Status: None   Collection Time: 05/29/19  2:03 PM   Specimen: Nasopharyngeal Swab  Result Value Ref Range Status   SARS Coronavirus 2 NEGATIVE NEGATIVE Final    Comment: (NOTE) SARS-CoV-2 target nucleic acids are NOT DETECTED. The SARS-CoV-2 RNA is generally detectable in upper and lower respiratory specimens during the acute phase of infection. Negative results do not preclude SARS-CoV-2 infection, do not rule out co-infections with other pathogens, and should not be used as the sole basis for treatment or other patient management decisions. Negative results must be combined with clinical observations, patient history, and epidemiological information. The expected result is Negative. Fact Sheet for Patients: SugarRoll.be Fact Sheet for Healthcare Providers: https://www.woods-mathews.com/ This test is not yet approved or cleared by the [place] FDA and  has been authorized for detection and/or diagnosis of SARS-CoV-2 by FDA under an Emergency Use Authorization (EUA). This EUA will remain  in effect (meaning this test can be used) for the duration of the COVID-19  declaration under Section 56 4(b)(1) of the Act, 21 U.S.C. section 360bbb-3(b)(1), unless the authorization is terminated or revoked sooner. Performed at [facility], [place] [address] [place], [place] [address]   SARS CORONAVIRUS 2 (TAT 6-24 HRS) Nasopharyngeal Nasopharyngeal Swab     Status: None   Collection Time: 06/05/19  1:24 PM   Specimen: Nasopharyngeal Swab  Result Value Ref Range Status   SARS Coronavirus 2 NEGATIVE NEGATIVE Final    Comment: (NOTE) SARS-CoV-2 target nucleic acids are NOT DETECTED. The SARS-CoV-2 RNA is generally detectable in upper and lower respiratory specimens during the acute phase of infection. Negative results do not preclude SARS-CoV-2 infection, do not rule out co-infections with other pathogens, and should not be used as the sole basis for treatment or other patient management decisions. Negative results must be combined with clinical observations, patient history, and epidemiological information. The expected result is Negative. Fact Sheet for Patients: SugarRoll.be Fact Sheet for Healthcare Providers: https://www.woods-mathews.com/ This test is  not yet approved or cleared by the [place] and  has been authorized for detection and/or diagnosis of SARS-CoV-2 by FDA under an Emergency Use Authorization (EUA). This EUA will remain  in effect (meaning this test can be used) for the duration of the COVID-19 declaration under Section 56 4(b)(1) of the Act, 21 U.S.C. section 360bbb-3(b)(1), unless the authorization is terminated or revoked sooner. Performed at [facility], [place] [address] [place], [place] [address]      Labs: Basic Metabolic Panel: No results for input(s): NA, K, CL, CO2, GLUCOSE, BUN, CREATININE, CALCIUM, MG, PHOS in the last 168 hours. Liver Function Tests: No results for input(s): AST, ALT, ALKPHOS, BILITOT, PROT, ALBUMIN in the last 168 hours. No results for  input(s): LIPASE, AMYLASE in the last 168 hours. No results for input(s): AMMONIA in the last 168 hours. CBC: No results for input(s): WBC, NEUTROABS, HGB, HCT, MCV, PLT in the last 168 hours. Cardiac Enzymes: No results for input(s): CKTOTAL, CKMB, CKMBINDEX, TROPONINI in the last 168 hours. BNP: BNP (last 3 results) Recent Labs    03/23/19 1747  BNP 431.3*    ProBNP (last 3 results) No results for input(s): PROBNP in the last 8760 hours.  CBG: Recent Labs  Lab 06/07/19 1629 06/07/19 1914 06/07/19 2312 06/08/19 0332 06/08/19 0827  GLUCAP 111* 172* 130* 104* 113*       Signed:  Kayleen Memos, MD [facility] 06/08/2019, 8:38 AM

## 2019-07-26 DIAGNOSIS — E119 Type 2 diabetes mellitus without complications: Secondary | ICD-10-CM

## 2019-07-26 DIAGNOSIS — K219 Gastro-esophageal reflux disease without esophagitis: Secondary | ICD-10-CM

## 2019-07-26 DIAGNOSIS — R4182 Altered mental status, unspecified: Secondary | ICD-10-CM

## 2019-07-26 DIAGNOSIS — J9620 Acute and chronic respiratory failure, unspecified whether with hypoxia or hypercapnia: Secondary | ICD-10-CM

## 2019-07-26 DIAGNOSIS — I959 Hypotension, unspecified: Secondary | ICD-10-CM

## 2019-07-26 DIAGNOSIS — I5020 Unspecified systolic (congestive) heart failure: Secondary | ICD-10-CM

## 2019-07-26 DIAGNOSIS — J84116 Cryptogenic organizing pneumonia: Secondary | ICD-10-CM

## 2019-07-27 DIAGNOSIS — R4182 Altered mental status, unspecified: Secondary | ICD-10-CM | POA: Diagnosis not present

## 2019-07-27 DIAGNOSIS — I959 Hypotension, unspecified: Secondary | ICD-10-CM | POA: Diagnosis not present

## 2019-07-27 DIAGNOSIS — E119 Type 2 diabetes mellitus without complications: Secondary | ICD-10-CM | POA: Diagnosis not present

## 2019-07-27 DIAGNOSIS — J9620 Acute and chronic respiratory failure, unspecified whether with hypoxia or hypercapnia: Secondary | ICD-10-CM | POA: Diagnosis not present

## 2019-07-28 DIAGNOSIS — R4182 Altered mental status, unspecified: Secondary | ICD-10-CM | POA: Diagnosis not present

## 2019-07-28 DIAGNOSIS — E119 Type 2 diabetes mellitus without complications: Secondary | ICD-10-CM | POA: Diagnosis not present

## 2019-07-28 DIAGNOSIS — J9620 Acute and chronic respiratory failure, unspecified whether with hypoxia or hypercapnia: Secondary | ICD-10-CM | POA: Diagnosis not present

## 2019-07-28 DIAGNOSIS — I959 Hypotension, unspecified: Secondary | ICD-10-CM | POA: Diagnosis not present

## 2019-07-29 DIAGNOSIS — R4182 Altered mental status, unspecified: Secondary | ICD-10-CM | POA: Diagnosis not present

## 2019-07-29 DIAGNOSIS — J9620 Acute and chronic respiratory failure, unspecified whether with hypoxia or hypercapnia: Secondary | ICD-10-CM | POA: Diagnosis not present

## 2019-07-29 DIAGNOSIS — I959 Hypotension, unspecified: Secondary | ICD-10-CM | POA: Diagnosis not present

## 2019-07-29 DIAGNOSIS — E119 Type 2 diabetes mellitus without complications: Secondary | ICD-10-CM | POA: Diagnosis not present

## 2019-07-30 DIAGNOSIS — I959 Hypotension, unspecified: Secondary | ICD-10-CM | POA: Diagnosis not present

## 2019-07-30 DIAGNOSIS — R4182 Altered mental status, unspecified: Secondary | ICD-10-CM | POA: Diagnosis not present

## 2019-07-30 DIAGNOSIS — E119 Type 2 diabetes mellitus without complications: Secondary | ICD-10-CM | POA: Diagnosis not present

## 2019-07-30 DIAGNOSIS — J9620 Acute and chronic respiratory failure, unspecified whether with hypoxia or hypercapnia: Secondary | ICD-10-CM | POA: Diagnosis not present

## 2019-08-25 DEATH — deceased

## 2020-09-27 IMAGING — DX PORTABLE CHEST - 1 VIEW
1 series · 1 of 1 positions shown · non-contrast
Comparison: 03/23/2019 at [DATE] a.m.

CLINICAL DATA: Respiratory failure. Endotracheal tube placement.

EXAM:
PORTABLE CHEST 1 VIEW [DATE] p.m.

[chest ap]
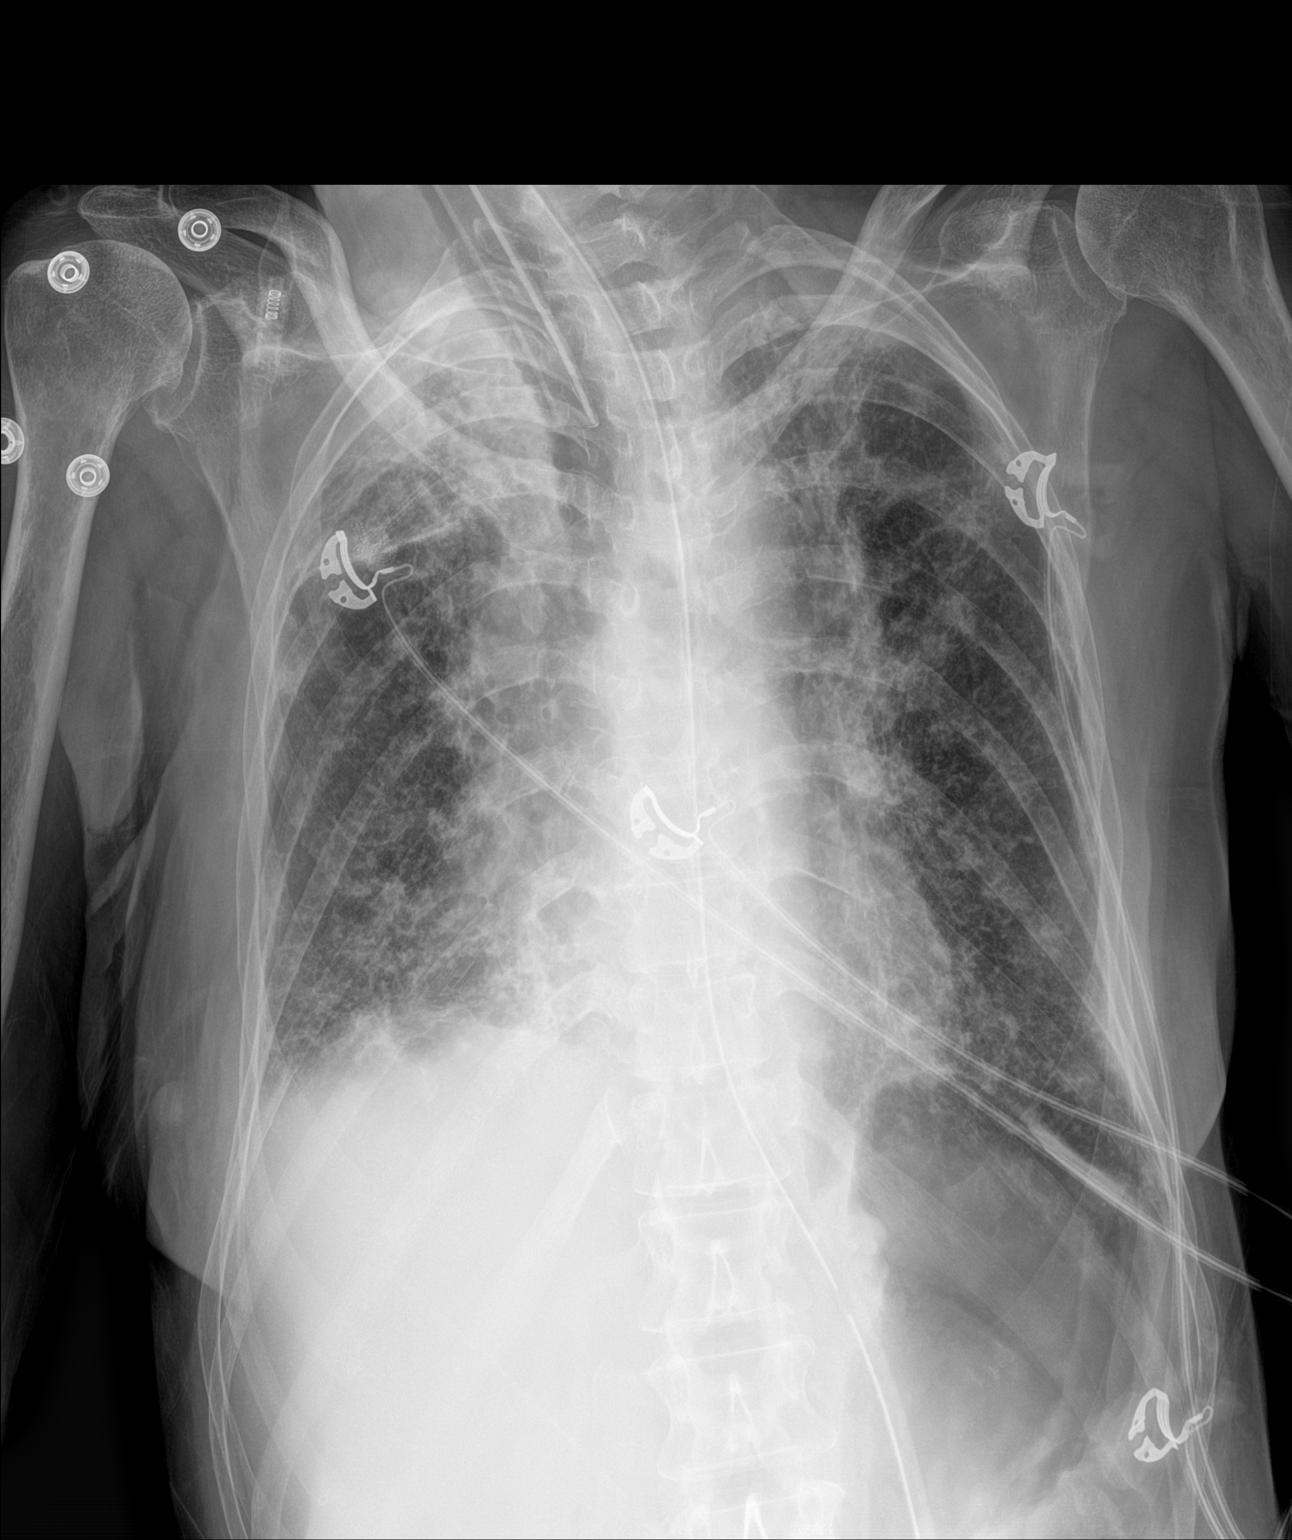

[1 of 1 positions shown; findings below may reference images not displayed]

FINDINGS: Endotracheal tube is 4.7 cm above the carina. NG tube tip is in the
body of the stomach.

Heart size and pulmonary vascularity are normal.

There is diffuse accentuation of the interstitial markings
particularly in the right upper lobe where there is an ill-defined
infiltrate. There is also streaky infiltrate in the left lung apex.
However, both of these areas of infiltrate have improved since the
prior study of earlier on the same date. No discrete effusion.
IMPRESSION: 1. Endotracheal tube and NG tube appear in good position.
2. Slightly improving bilateral infiltrates.

## 2020-11-26 IMAGING — CT CT CHEST HIGH RESOLUTION W/O CM
2 of 7 series · 13 of 36 positions shown, 16 images · non-contrast
Comparison: 05/20/2019, 04/29/2019, 03/22/2019, 01/11/2018 and
11/26/2016.

CLINICAL DATA: Interstitial lung disease with acute exacerbation.
History of organizing pneumonia.

EXAM:
CT CHEST WITHOUT CONTRAST
TECHNIQUE: Multidetector CT imaging of the chest was performed following the
standard protocol without intravenous contrast. High resolution
imaging of the lungs, as well as inspiratory and expiratory imaging,
was performed.

[Series 6: coronals · coronal · 0.46mm/px · 3 of 95 slices shown]
[im 19/95  lung]
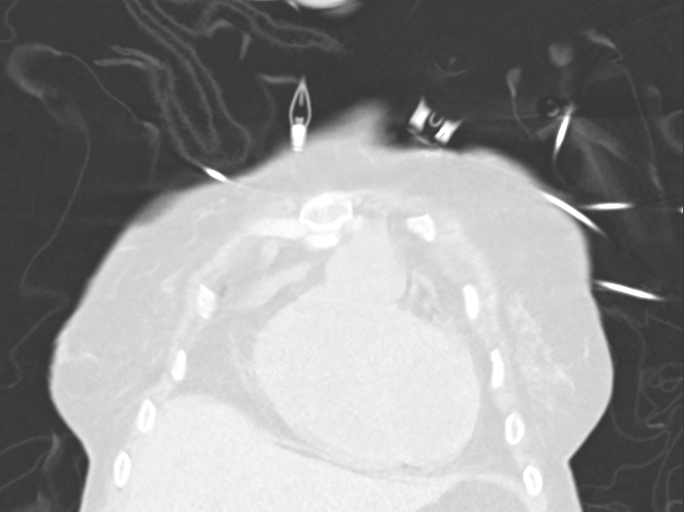
[im 38/95  lung]
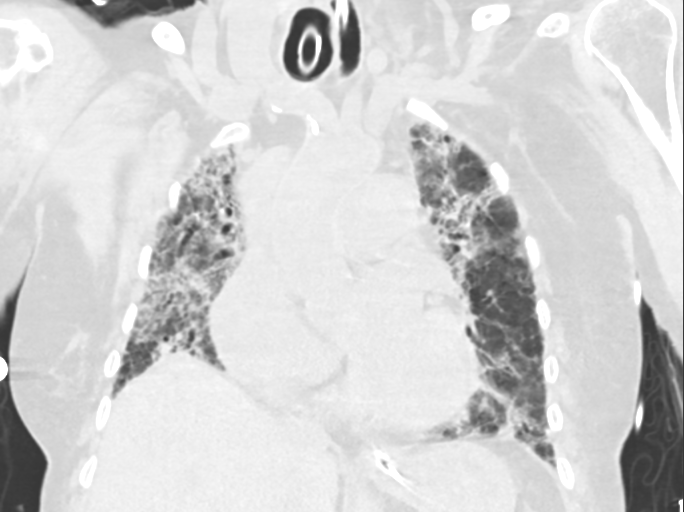
[im 57/95  lung]
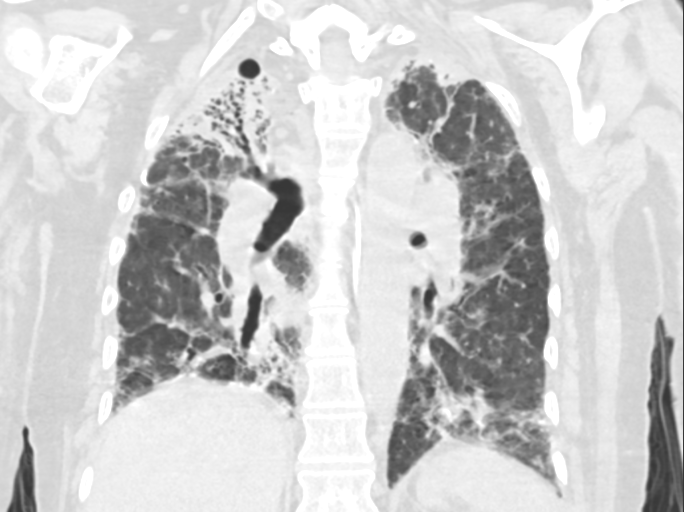

[Series 9: hi res thins · axial · 0.56mm/px · z∈[+1323,+1516]mm · 10 of 237 slices shown, 13 images]
[im 22/237  mediastinal]
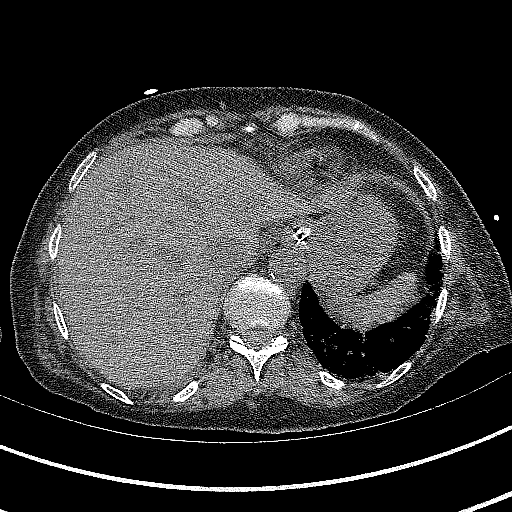
[im 22/237  lung]
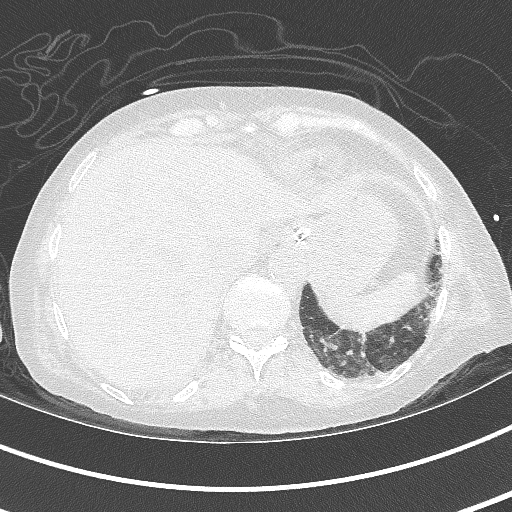
[im 43/237  lung]
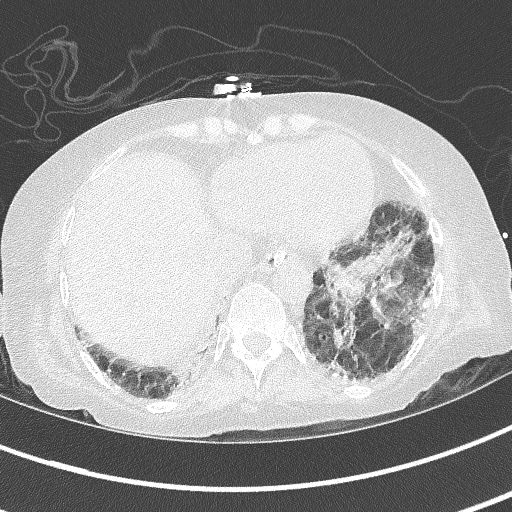
[im 65/237  lung]
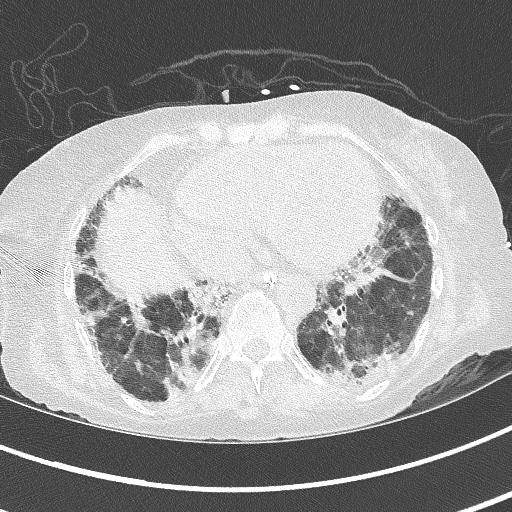
[im 86/237  lung]
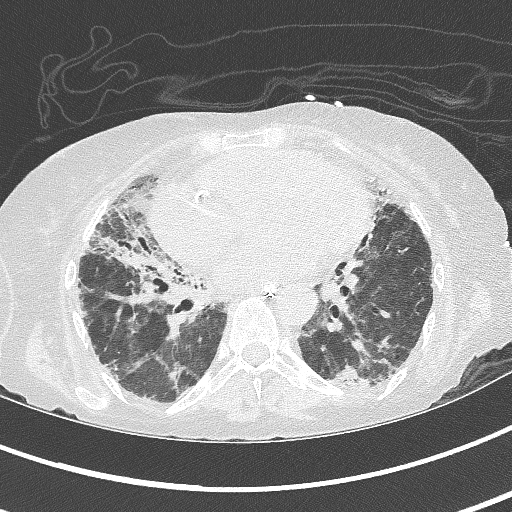
[im 108/237  mediastinal]
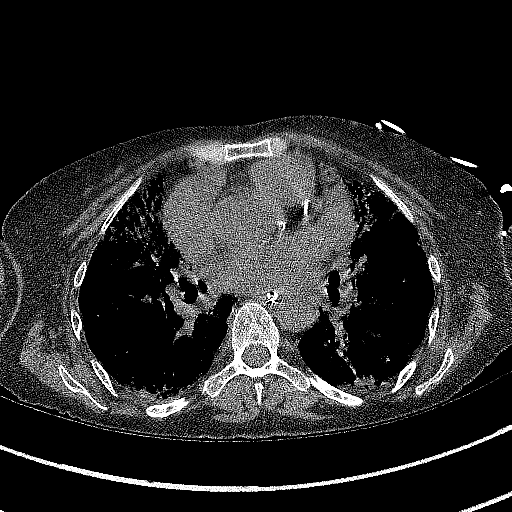
[im 108/237  lung]
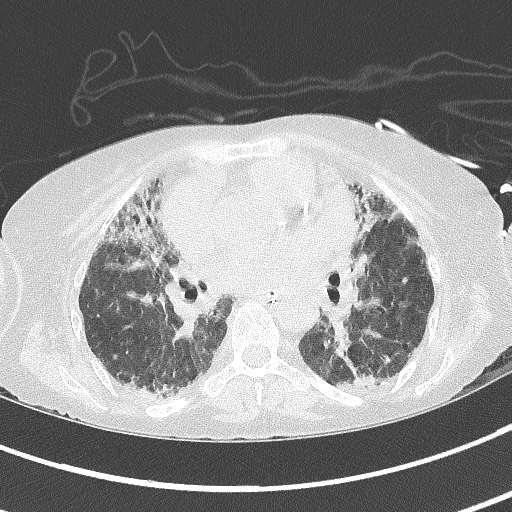
[im 129/237  lung]
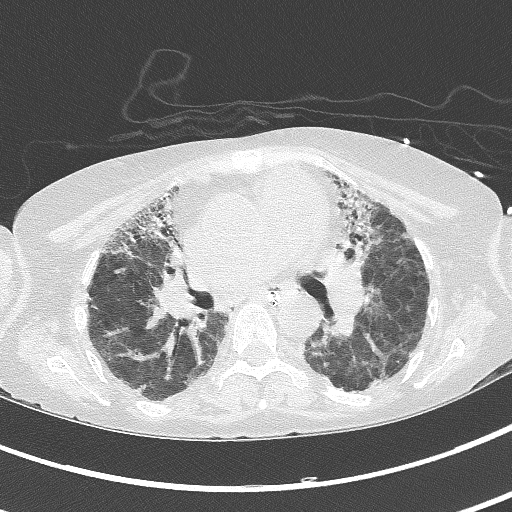
[im 151/237  lung]
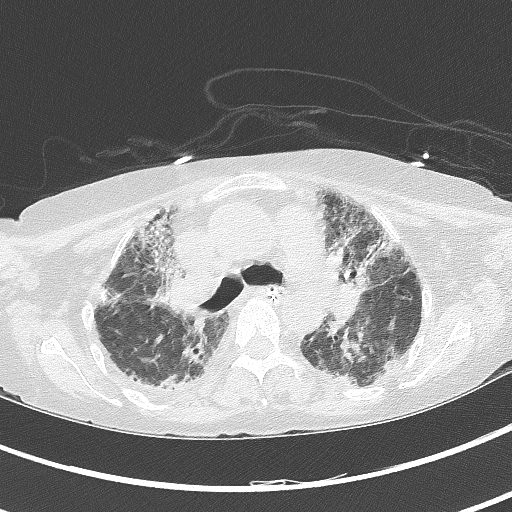
[im 172/237  lung]
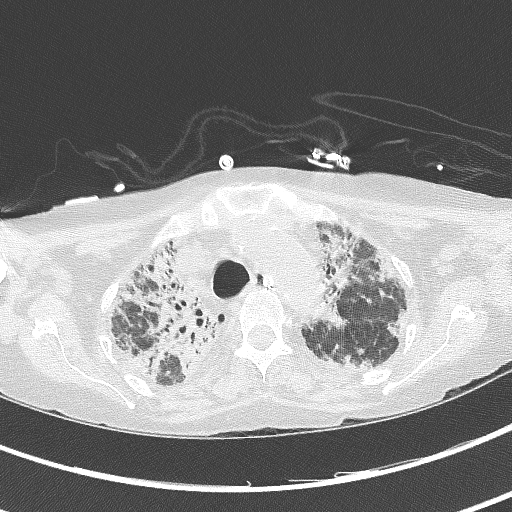
[im 194/237  mediastinal]
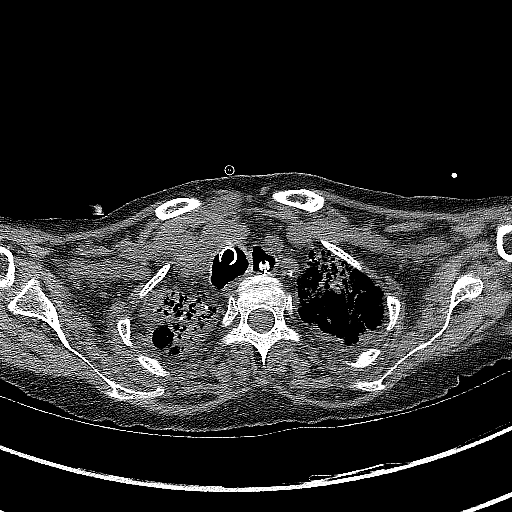
[im 194/237  lung]
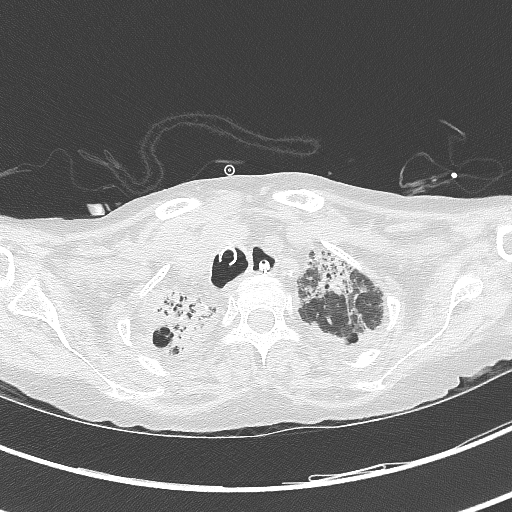
[im 215/237  lung]
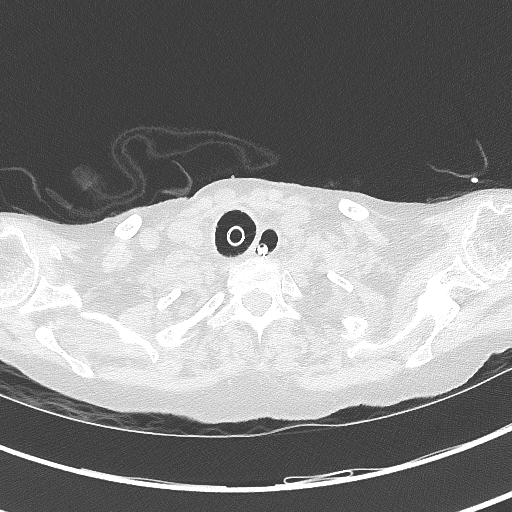

[13 of 36 positions shown; findings below may reference images not displayed]

FINDINGS: Cardiovascular: Atherosclerotic calcification of the aorta and
coronary arteries. Pulmonic trunk and heart are enlarged. There is
decreased attenuation of the intravascular compartment which is
indicative of anemia. No pericardial effusion.

Mediastinum/Nodes: Mediastinal lymph nodes are not enlarged by CT
size criteria. Hilar regions are difficult to evaluate without IV
contrast. No axillary adenopathy. A nasogastric tube is seen in the
esophagus.

Lungs/Pleura: Image quality is somewhat degraded by respiratory
motion. Postoperative changes in the right upper lobe. There are
confluent areas of parenchymal consolidation/retraction with
associated traction bronchiectasis/bronchiolectasis, with a slight
upper/midlung zone predominance. Associated interstitial coarsening
and subpleural reticulation. No definitive honeycombing. Findings
may be slightly progressive from 01/11/2018, especially in the lower
lobes. Calcified granuloma in the left lower lobe. No air trapping.
No pleural fluid. Endotracheal tube terminates 3 cm above the
carina.

Upper Abdomen: Visualized portions of the liver, adrenal glands,
spleen and stomach are unremarkable with exception of a nasogastric
tube partially imaged in the stomach.

Musculoskeletal: No worrisome lytic or sclerotic lesions.
IMPRESSION: 1. Pulmonary parenchymal pattern of fibrosis appears mildly
progressive from 01/11/2018 and would be consistent with a reported
history of sarcoid. Chronic hypersensitivity pneumonitis is not
excluded. Findings are suggestive of an alternative diagnosis (not
UIP) per consensus guidelines: Diagnosis of Idiopathic Pulmonary
Fibrosis: An Official ATS/ERS/JRS/ALAT Clinical Practice Guideline.
Am J Respir Crit Care Med Vol 198, [place] 5, ppe88-e[DATE].
2. Aortic atherosclerosis (15H7M-170.0). Coronary artery
calcification.
3. Enlarged pulmonic trunk, indicative of pulmonary arterial
hypertension.
